# Patient Record
Sex: Female | Born: 1950 | Hispanic: No | Marital: Married | Smoking: Never smoker
Health system: Southern US, Community
[De-identification: ages and names within clinical notes are randomized; demographics above are authoritative.]

## PROBLEM LIST (undated history)

## (undated) DIAGNOSIS — L03115 Cellulitis of right lower limb: Secondary | ICD-10-CM

## (undated) DIAGNOSIS — E785 Hyperlipidemia, unspecified: Secondary | ICD-10-CM

## (undated) DIAGNOSIS — M8580 Other specified disorders of bone density and structure, unspecified site: Secondary | ICD-10-CM

## (undated) DIAGNOSIS — J189 Pneumonia, unspecified organism: Secondary | ICD-10-CM

## (undated) HISTORY — DX: Hyperlipidemia, unspecified: E78.5

## (undated) HISTORY — DX: Pneumonia, unspecified organism: J18.9

## (undated) HISTORY — PX: OTHER SURGICAL HISTORY: SHX169

## (undated) HISTORY — DX: Other specified disorders of bone density and structure, unspecified site: M85.80

## (undated) HISTORY — DX: Cellulitis of right lower limb: L03.115

## (undated) HISTORY — PX: TONSILLECTOMY: SUR1361

---

## 1997-08-09 ENCOUNTER — Other Ambulatory Visit: Admission: RE | Admit: 1997-08-09 | Discharge: 1997-08-09 | Payer: Self-pay | Admitting: *Deleted

## 1998-08-31 ENCOUNTER — Other Ambulatory Visit: Admission: RE | Admit: 1998-08-31 | Discharge: 1998-08-31 | Payer: Self-pay | Admitting: *Deleted

## 1999-10-11 ENCOUNTER — Other Ambulatory Visit: Admission: RE | Admit: 1999-10-11 | Discharge: 1999-10-11 | Payer: Self-pay | Admitting: *Deleted

## 2000-10-15 ENCOUNTER — Other Ambulatory Visit: Admission: RE | Admit: 2000-10-15 | Discharge: 2000-10-15 | Payer: Self-pay | Admitting: *Deleted

## 2001-10-28 ENCOUNTER — Other Ambulatory Visit: Admission: RE | Admit: 2001-10-28 | Discharge: 2001-10-28 | Payer: Self-pay | Admitting: *Deleted

## 2002-11-03 ENCOUNTER — Other Ambulatory Visit: Admission: RE | Admit: 2002-11-03 | Discharge: 2002-11-03 | Payer: Self-pay | Admitting: *Deleted

## 2005-01-10 ENCOUNTER — Ambulatory Visit: Payer: Self-pay | Admitting: Internal Medicine

## 2005-01-17 ENCOUNTER — Ambulatory Visit: Payer: Self-pay | Admitting: Internal Medicine

## 2005-01-22 ENCOUNTER — Ambulatory Visit: Payer: Self-pay | Admitting: Internal Medicine

## 2006-02-14 ENCOUNTER — Ambulatory Visit: Payer: Self-pay | Admitting: Family Medicine

## 2006-12-23 ENCOUNTER — Encounter: Payer: Self-pay | Admitting: Internal Medicine

## 2007-04-30 ENCOUNTER — Encounter: Payer: Self-pay | Admitting: Internal Medicine

## 2007-10-04 ENCOUNTER — Ambulatory Visit: Payer: Self-pay | Admitting: Internal Medicine

## 2007-10-04 ENCOUNTER — Telehealth: Payer: Self-pay | Admitting: Internal Medicine

## 2007-10-04 DIAGNOSIS — Z9089 Acquired absence of other organs: Secondary | ICD-10-CM

## 2007-10-04 DIAGNOSIS — E785 Hyperlipidemia, unspecified: Secondary | ICD-10-CM | POA: Insufficient documentation

## 2007-10-04 DIAGNOSIS — M810 Age-related osteoporosis without current pathological fracture: Secondary | ICD-10-CM | POA: Insufficient documentation

## 2007-10-04 DIAGNOSIS — R519 Headache, unspecified: Secondary | ICD-10-CM | POA: Insufficient documentation

## 2007-10-04 DIAGNOSIS — R51 Headache: Secondary | ICD-10-CM

## 2007-10-04 DIAGNOSIS — S82899A Other fracture of unspecified lower leg, initial encounter for closed fracture: Secondary | ICD-10-CM

## 2007-10-04 DIAGNOSIS — IMO0001 Reserved for inherently not codable concepts without codable children: Secondary | ICD-10-CM

## 2007-10-04 LAB — CONVERTED CEMR LAB
Inflenza A Ag: NEGATIVE
Influenza B Ag: NEGATIVE

## 2007-10-09 LAB — CONVERTED CEMR LAB
Basophils Absolute: 0 10*3/uL (ref 0.0–0.1)
HCT: 36.9 % (ref 36.0–46.0)
Lymphocytes Relative: 36.5 % (ref 12.0–46.0)
Monocytes Absolute: 0.3 10*3/uL (ref 0.1–1.0)
Monocytes Relative: 9.2 % (ref 3.0–12.0)
Platelets: 228 10*3/uL (ref 150–400)
RDW: 12.7 % (ref 11.5–14.6)

## 2007-10-12 ENCOUNTER — Telehealth (INDEPENDENT_AMBULATORY_CARE_PROVIDER_SITE_OTHER): Payer: Self-pay | Admitting: *Deleted

## 2007-10-12 ENCOUNTER — Encounter (INDEPENDENT_AMBULATORY_CARE_PROVIDER_SITE_OTHER): Payer: Self-pay | Admitting: *Deleted

## 2008-01-04 ENCOUNTER — Encounter: Payer: Self-pay | Admitting: Internal Medicine

## 2008-04-10 ENCOUNTER — Ambulatory Visit: Payer: Self-pay | Admitting: Internal Medicine

## 2008-04-10 DIAGNOSIS — E559 Vitamin D deficiency, unspecified: Secondary | ICD-10-CM | POA: Insufficient documentation

## 2008-04-10 DIAGNOSIS — M545 Low back pain: Secondary | ICD-10-CM

## 2008-04-10 LAB — CONVERTED CEMR LAB
Bilirubin Urine: NEGATIVE
Cholesterol, target level: 200 mg/dL
Glucose, Urine, Semiquant: NEGATIVE
Protein, U semiquant: NEGATIVE
Specific Gravity, Urine: <1.005
pH: 6.5

## 2008-11-02 ENCOUNTER — Ambulatory Visit: Payer: Self-pay | Admitting: Internal Medicine

## 2008-11-17 ENCOUNTER — Ambulatory Visit: Payer: Self-pay | Admitting: Internal Medicine

## 2008-11-17 DIAGNOSIS — B079 Viral wart, unspecified: Secondary | ICD-10-CM | POA: Insufficient documentation

## 2008-11-17 DIAGNOSIS — H5310 Unspecified subjective visual disturbances: Secondary | ICD-10-CM | POA: Insufficient documentation

## 2008-12-25 ENCOUNTER — Encounter: Payer: Self-pay | Admitting: Internal Medicine

## 2009-01-08 ENCOUNTER — Encounter (INDEPENDENT_AMBULATORY_CARE_PROVIDER_SITE_OTHER): Payer: Self-pay | Admitting: *Deleted

## 2009-01-11 ENCOUNTER — Encounter: Payer: Self-pay | Admitting: Internal Medicine

## 2009-05-03 ENCOUNTER — Encounter: Payer: Self-pay | Admitting: Internal Medicine

## 2009-07-10 ENCOUNTER — Ambulatory Visit: Payer: Self-pay | Admitting: Internal Medicine

## 2009-09-28 ENCOUNTER — Ambulatory Visit: Payer: Self-pay | Admitting: Family Medicine

## 2009-11-29 ENCOUNTER — Ambulatory Visit: Payer: Self-pay | Admitting: Internal Medicine

## 2009-11-29 DIAGNOSIS — F411 Generalized anxiety disorder: Secondary | ICD-10-CM | POA: Insufficient documentation

## 2010-04-16 ENCOUNTER — Telehealth: Payer: Self-pay | Admitting: Internal Medicine

## 2010-04-17 ENCOUNTER — Encounter: Payer: Self-pay | Admitting: Internal Medicine

## 2010-04-23 NOTE — Assessment & Plan Note (Signed)
Summary: anxiety/cbs   Vital Signs:  Patient profile:   60 year old female Height:      64 inches Weight:      133 pounds Temp:     98.9 degrees F oral Pulse rate:   68 / minute Resp:     15 per minute BP sitting:   122 / 82  (left arm)  Vitals Entered By: Jeremy Johann CMA (November 29, 2009 12:46 PM) CC: discuss anxiety, stress, pain in neck   Primary Care Alayzia Pavlock:  Alwyn Ren  CC:  discuss anxiety, stress, and pain in neck.  History of Present Illness: Major stresses related to her daughter's health issues ( depression , Bulimia) with resultant major anxiety.  Current Medications (verified): 1)  None  Allergies (verified): No Known Drug Allergies  Review of Systems General:  Complains of sleep disorder; denies weight loss; "Mind races all day & night  long ". CV:  Denies palpitations. GI:  Denies constipation, diarrhea, and indigestion. Psych:  Complains of anxiety and easily tearful; denies depression, easily angered, irritability, panic attacks, and suicidal thoughts/plans.  Physical Exam  General:  Thin but well-nourished,in no acute distress; alert,appropriate and cooperative throughout examination Eyes:  No corneal or conjunctival inflammation noted. No lid lag Neck:  No deformities, masses, or tenderness noted.Slight asymmetry Heart:  regular rhythm, no murmur, no gallop, no rub, and bradycardia.   Neurologic:   oriented X3 and DTRs symmetrical and normal.  No tremor Psych:  normally interactive  but slightly tearful.     Impression & Recommendations:  Problem # 1:  ANXIETY STATE, UNSPECIFIED (ICD-300.00)  Her updated medication list for this problem includes:    Citalopram Hydrobromide 20 Mg Tabs (Citalopram hydrobromide) .Marland Kitchen... 1 once daily    Lorazepam 0.5 Mg Tabs (Lorazepam) .Marland Kitchen... 1 every 6-8 hrs as needed  Complete Medication List: 1)  Citalopram Hydrobromide 20 Mg Tabs (Citalopram hydrobromide) .Marland Kitchen.. 1 once daily 2)  Lorazepam 0.5 Mg Tabs  (Lorazepam) .Marland Kitchen.. 1 every 6-8 hrs as needed  Patient Instructions: 1)  Assess response to Citalopram . Prescriptions: LORAZEPAM 0.5 MG TABS (LORAZEPAM) 1 every 6-8 hrs as needed  #30 x 1   Entered and Authorized by:   Marga Melnick MD   Signed by:   Marga Melnick MD on 11/29/2009   Method used:   Print then Give to Patient   RxID:   [id number] CITALOPRAM HYDROBROMIDE 20 MG TABS (CITALOPRAM HYDROBROMIDE) 1 once daily  #30 x 5   Entered and Authorized by:   Marga Melnick MD   Signed by:   Marga Melnick MD on 11/29/2009   Method used:   Print then Give to Patient   RxID:   [id number]

## 2010-04-23 NOTE — Assessment & Plan Note (Signed)
Summary: congested/cbs   Vital Signs:  Patient profile:   60 year old female Height:      64 inches Weight:      129.6 pounds BMI:     22.33 Temp:     99.2 degrees F oral Pulse rate:   64 / minute BP sitting:   110 / 70  (left arm)  Vitals Entered By: Jeremy Johann CMA (September 28, 2009 4:01 PM)         CC: nasal and head congestion, cough, drainage, sore throat, URI symptoms Comments REVIEWED MED LIST, PATIENT AGREED DOSE AND INSTRUCTION CORRECT    History of Present Illness:       This is a 60 year old woman who presents with URI symptoms.  The symptoms began 1 week ago.  The patient complains of nasal congestion, purulent nasal discharge, productive cough, and sick contacts.  Associated symptoms include fever.  The patient denies low-grade fever (<100.5 degrees), fever of 100.5-103 degrees, fever of 103.1-104 degrees, fever to >104 degrees, stiff neck, dyspnea, wheezing, rash, vomiting, diarrhea, use of an antipyretic, and response to antipyretic.  The patient denies itchy watery eyes, itchy throat, sneezing, seasonal symptoms, response to antihistamine, headache, muscle aches, and severe fatigue.  The patient denies the following risk factors for Strep sinusitis: unilateral facial pain, unilateral nasal discharge, poor response to decongestant, double sickening, tooth pain, Strep exposure, tender adenopathy, and absence of cough.    Current Medications (verified): 1)  None  Allergies (verified): No Known Drug Allergies  Physical Exam  General:  Well-developed,well-nourished,in no acute distress; alert,appropriate and cooperative throughout examination Ears:  External ear exam shows no significant lesions or deformities.  Otoscopic examination reveals clear canals, tympanic membranes are intact bilaterally without bulging, retraction, inflammation or discharge. Hearing is grossly normal bilaterally. Nose:  L frontal sinus tenderness and R frontal sinus tenderness.   Mouth:  Oral  mucosa and oropharynx without lesions or exudates.  Teeth in good repair. Neck:  No deformities, masses, or tenderness noted. Lungs:  Normal respiratory effort, chest expands symmetrically. Lungs are clear to auscultation, no crackles or wheezes. Heart:  normal rate and no murmur.   Psych:  Oriented X3 and normally interactive.     Impression & Recommendations:  Problem # 1:  SINUSITIS - ACUTE-NOS (ICD-461.9)  The following medications were removed from the medication list:    Azithromycin 250 Mg Tabs (Azithromycin) .Marland Kitchen... As per pack    Promethazine Vc/codeine 6.25-5-10 Mg/41ml Syrp (Phenyleph-promethazine-cod) .Marland Kitchen... 1 tsp every 6 hrs as needed for cough Her updated medication list for this problem includes:    Augmentin 875-125 Mg Tabs (Amoxicillin-pot clavulanate) .Marland Kitchen... 1 by mouth two times a day    Veramyst 27.5 Mcg/spray Susp (Fluticasone furoate) .Marland Kitchen... 2 sprays each nostril once daily     Pt still has cheratusin from last visit for hs--use mucinex for day cough  Instructed on treatment. Call if symptoms persist or worsen.   Complete Medication List: 1)  Augmentin 875-125 Mg Tabs (Amoxicillin-pot clavulanate) .Marland Kitchen.. 1 by mouth two times a day 2)  Veramyst 27.5 Mcg/spray Susp (Fluticasone furoate) .... 2 sprays each nostril once daily Prescriptions: AUGMENTIN 875-125 MG TABS (AMOXICILLIN-POT CLAVULANATE) 1 by mouth two times a day  #20 x 0   Entered and Authorized by:   Loreen Freud DO   Signed by:   Loreen Freud DO on 09/28/2009   Method used:   Electronically to        [facility] [address] (  retail)       [address]       [place], [place]  [address]       Ph: [phone]       Fax: [phone]   RxID:   [id number]

## 2010-04-23 NOTE — Assessment & Plan Note (Signed)
Summary: virus??//lch   Vital Signs:  Patient profile:   60 year old female Weight:      129.6 pounds Temp:     99.1 degrees F oral Pulse rate:   64 / minute Resp:     14 per minute BP sitting:   118 / 70  (left arm) Cuff size:   regular  Vitals Entered By: Shonna Chock (July 10, 2009 11:53 AM) CC: Weak, aches,cough,headache,  and nasel congestion since Friday Comments REVIEWED MED LIST, PATIENT AGREED DOSE AND INSTRUCTION CORRECT    CC:  Weak, aches, cough, headache, and and nasel congestion since Friday.  History of Present Illness: Onset 07/05/2009  as head congestion ST, myalgias, arthralgias & loose cough. No flu shot.Rx: Mucinex D, NSAIDS w/o benefit.  Allergies (verified): No Known Drug Allergies  Review of Systems General:  Complains of chills, fever, and sweats. ENT:  Complains of nasal congestion and sinus pressure; denies ear discharge, earache, and sore throat; No frontal headache or facial pain but some yellow discharge. Resp:  Denies chest pain with inspiration, shortness of breath, and wheezing; Sputum swallowed.  Physical Exam  General:  Appears tired but in no acute distress; alert,appropriate and cooperative throughout examination Ears:  External ear exam shows no significant lesions or deformities.  Otoscopic examination reveals clear canals, tympanic membranes are intact bilaterally without bulging, retraction, inflammation or discharge. Hearing is grossly normal bilaterally. Nose:  External nasal examination shows no deformity or inflammation. Nasal mucosa are  boggy without lesions or exudates. Mouth:  Oral mucosa and oropharynx without lesions or exudates.  Teeth in good repair. Lungs:  Normal respiratory effort, chest expands symmetrically. Lungs are clear to auscultation, no crackles or wheezes. rattly cough Heart:  Normal rate and regular rhythm. S1 and S2 normal without gallop, murmur, click, rub.S4 Cervical Nodes:  Shotty lymphadenopathy  noted Axillary Nodes:  No palpable lymphadenopathy   Impression & Recommendations:  Problem # 1:  BRONCHITIS-ACUTE (ICD-466.0)  Possible viral /flu @ onset  , now 5 days out  Her updated medication list for this problem includes:    Azithromycin 250 Mg Tabs (Azithromycin) .Marland Kitchen... As per pack    Promethazine Vc/codeine 6.25-5-10 Mg/49ml Syrp (Phenyleph-promethazine-cod) .Marland Kitchen... 1 tsp every 6 hrs as needed for cough  Problem # 2:  URI (ICD-465.9)  Her updated medication list for this problem includes:    Promethazine Vc/codeine 6.25-5-10 Mg/47ml Syrp (Phenyleph-promethazine-cod) .Marland Kitchen... 1 tsp every 6 hrs as needed for cough  Complete Medication List: 1)  Azithromycin 250 Mg Tabs (Azithromycin) .... As per pack 2)  Promethazine Vc/codeine 6.25-5-10 Mg/47ml Syrp (Phenyleph-promethazine-cod) .Marland Kitchen.. 1 tsp every 6 hrs as needed for cough  Patient Instructions: 1)  Neti pot once daily until sinsuses are clear. 2)  Drink as much fluid as you can tolerate for the next few days.Consider annual flu shot Prescriptions: PROMETHAZINE VC/CODEINE 6.25-5-10 MG/5ML SYRP (PHENYLEPH-PROMETHAZINE-COD) 1 tsp every 6 hrs as needed for cough  #120cc x 0   Entered and Authorized by:   Marga Melnick MD   Signed by:   Marga Melnick MD on 07/10/2009   Method used:   Printed then faxed to ...       [facility] ([facility]ail)       [address]       [place], [place]  [address]       Ph: [phone]       Fax: [phone]   RxID:   [id number]  AZITHROMYCIN 250 MG TABS (AZITHROMYCIN) as per pack  #1 x 0   Entered and Authorized by:   Marga Melnick MD   Signed by:   Marga Melnick MD on 07/10/2009   Method used:   Printed then faxed to ...       [facility] (retail)       [address]       [place], [place]  [address]       Ph: [phone]       Fax: [phone]   RxID:   [id number]

## 2010-04-25 NOTE — Progress Notes (Signed)
Summary: RX request  Phone Note Call from Patient   Summary of Call: Patient left message on triage noting that the counselor/therapist she has recommended that she take Clonazepam 0.5mg  1 by mouth three times a day and patient would like prescription sent to [facility] [address]. Please advise. Initial call taken by: Lucious Groves CMA,  April 16, 2010 10:30 AM  Follow-up for Phone Call        Unfortunately , I need this in writing  for documentation as this can be habit forming  if taken on regular basis Follow-up by: Marga Melnick MD,  April 16, 2010 4:21 PM  Additional Follow-up for Phone Call Additional follow up Details #1::        Left message on machine to call back to office. Lucious Groves CMA  April 16, 2010 4:44 PM   Patient notified to have that MD fax over the recommendation. Lucious Groves CMA  April 17, 2010 2:19 PM

## 2010-05-09 NOTE — Letter (Signed)
Summary: [facility]  [facility]   Imported By: Sherian Rein 04/30/2010 11:24:23  _____________________________________________________________________  External Attachment:    Type:   Image     Comment:   External Document

## 2010-07-25 ENCOUNTER — Ambulatory Visit (INDEPENDENT_AMBULATORY_CARE_PROVIDER_SITE_OTHER): Payer: 59 | Admitting: Family Medicine

## 2010-07-25 ENCOUNTER — Encounter: Payer: Self-pay | Admitting: *Deleted

## 2010-07-25 VITALS — BP 112/70 | Temp 98.9°F | Wt 133.5 lb

## 2010-07-25 DIAGNOSIS — R350 Frequency of micturition: Secondary | ICD-10-CM | POA: Insufficient documentation

## 2010-07-25 LAB — POCT URINALYSIS DIPSTICK
Bilirubin, UA: NEGATIVE
Blood, UA: NEGATIVE
Ketones, UA: NEGATIVE
Nitrite, UA: NEGATIVE
pH, UA: 7.5

## 2010-07-25 NOTE — Progress Notes (Signed)
  Subjective:    Patient ID: Linda [name], female    DOB: 03-Aug-1950, 60 y.o.   MRN: [id number]  HPI Urinating frequently- sxs started 1 month ago, intermittant.  Some burning w/ urination.  Worse at night.  Denies fluid intake before bed but drinks a lot of water during the day.  Scant vaginal discharge- denies itching but some 'fishy' odor.  Some sxs of incomplete emptying.  No incontinence.  No sxs of similar.   Review of Systems For ROS see HPI     Objective:   Physical Exam  Constitutional: She appears well-developed and well-nourished. No distress.  Abdominal: Soft. Bowel sounds are normal. She exhibits no distension. There is no tenderness. There is no rebound.       No suprapubic or CVA tenderness          Assessment & Plan:

## 2010-07-25 NOTE — Patient Instructions (Signed)
We will send your urine for culture and call you if anything comes up Continue to drink plenty of fluids Limit your caffeine intake Cranberry juice may help Call with any questions or concerns Have a wonderful trip!!!

## 2010-07-26 ENCOUNTER — Encounter: Payer: Self-pay | Admitting: Internal Medicine

## 2010-07-28 NOTE — Assessment & Plan Note (Signed)
UA unremarkable in office but given pt's sxs will send it for cx.  No abx at this time- will await cx results.  Reviewed supportive care and red flags that should prompt return.  Pt expressed understanding and is in agreement w/ plan.

## 2010-07-29 LAB — URINE CULTURE

## 2010-07-29 MED ORDER — CEPHALEXIN 500 MG PO CAPS: 500.0000 mg | ORAL_CAPSULE | Freq: Two times a day (BID) | ORAL | Status: AC

## 2010-07-29 NOTE — Progress Notes (Signed)
Addended by: Alease Medina on: 07/29/2010 10:09 AM   Modules accepted: Orders

## 2011-02-20 ENCOUNTER — Encounter: Payer: Self-pay | Admitting: Internal Medicine

## 2011-02-27 ENCOUNTER — Encounter: Payer: Self-pay | Admitting: Internal Medicine

## 2011-03-14 ENCOUNTER — Other Ambulatory Visit: Payer: 59 | Admitting: Internal Medicine

## 2011-03-25 HISTORY — PX: COLONOSCOPY: SHX174

## 2011-03-31 ENCOUNTER — Ambulatory Visit (AMBULATORY_SURGERY_CENTER): Payer: No Typology Code available for payment source | Admitting: *Deleted

## 2011-03-31 VITALS — Ht 64.0 in | Wt 134.2 lb

## 2011-03-31 DIAGNOSIS — Z1211 Encounter for screening for malignant neoplasm of colon: Secondary | ICD-10-CM

## 2011-03-31 MED ORDER — PEG-KCL-NACL-NASULF-NA ASC-C 100 G PO SOLR: ORAL | Status: DC

## 2011-04-02 ENCOUNTER — Ambulatory Visit (INDEPENDENT_AMBULATORY_CARE_PROVIDER_SITE_OTHER): Payer: No Typology Code available for payment source | Admitting: Internal Medicine

## 2011-04-02 ENCOUNTER — Encounter: Payer: Self-pay | Admitting: Internal Medicine

## 2011-04-02 VITALS — BP 116/70 | HR 61 | Temp 98.5°F | Resp 12 | Ht 64.25 in | Wt 132.6 lb

## 2011-04-02 DIAGNOSIS — Z0000 Encounter for general adult medical examination without abnormal findings: Secondary | ICD-10-CM

## 2011-04-02 NOTE — Progress Notes (Signed)
Subjective:    Patient ID: Linda [name], female    DOB: 12/02/1950, 61 y.o.   MRN: [id number]  HPI  Linda [name]  is here for a physical;acute issues include pain in the right upper extremity when she lifts weights or does pushups. This been present for 6 months; there was no definite injury or trigger for this.      Review of Systems Patient reports no significant  vision/ hearing  changes, adenopathy,fever, weight change,  persistant / recurrent hoarseness , swallowing issues, chest pain,palpitations,edema,persistant /recurrent cough, hemoptysis, dyspnea( rest/ exertional/paroxysmal nocturnal), gastrointestinal bleeding(melena, rectal bleeding), abdominal pain, significant heartburn,  bowel changes,GU symptoms(dysuria, hematuria,pyuria, incontinence), Gyn symptoms(abnormal  bleeding , pain),  syncope, focal weakness, memory loss,numbness & tingling, skin/hair /nail changes,abnormal bruising or bleeding, anxiety,or depression.      Objective:   Physical Exam Gen.: Thin but healthy and well-nourished in appearance. Alert, appropriate and cooperative throughout exam. Head: Normocephalic without obvious abnormalities  Eyes: No corneal or conjunctival inflammation noted. Pupils equal round reactive to light and accommodation. Fundal exam is benign without hemorrhages, exudate, papilledema. Extraocular motion intact. Vision grossly normal. Ears: External  ear exam reveals no significant lesions or deformities. Canals clear .TMs normal. Hearing is grossly normal bilaterally. Nose: External nasal exam reveals no deformity or inflammation. Nasal mucosa are pink and moist. No lesions or exudates noted.  Mouth: Oral mucosa and oropharynx reveal no lesions or exudates. Teeth in good repair. Neck: No deformities, masses, or tenderness noted. Range of motion & Thyroid normal. Lungs: Normal respiratory effort; chest expands symmetrically. Lungs are clear to auscultation without rales, wheezes, or increased work  of breathing. Heart: Normal rate and rhythm. Normal S1 and S2. No gallop or rub. S 4 vs soft click ; no  murmur. Abdomen: Bowel sounds normal; abdomen soft and nontender. No masses, organomegaly or hernias noted. Genitalia: Dr Ernestina Penna .                                                                                   Musculoskeletal/extremities: No deformity or scoliosis noted of  the thoracic or lumbar spine. No clubbing, cyanosis, edema, or deformity noted. Range of motion  normal .Tone & strength  normal.Joints normal. Nail health  good. There is pain over the lateral  upper right arm to opposition. Vascular: Carotid, radial artery, dorsalis pedis and  posterior tibial pulses are full and equal. No bruits present. Neurologic: Alert and oriented x3. Deep tendon reflexes symmetrical and normal.          Skin: Intact without suspicious lesions or rashes. Lymph: No cervical, axillary lymphadenopathy present. Psych: Mood and affect are normal. Normally interactive                                                                                         Assessment &  Plan:  #1 comprehensive physical exam; no acute findings  #2 right upper extremity pain to opposition; rule out tendinitis #3see Problem List with Assessments & Recommendations Plan: see Orders

## 2011-04-02 NOTE — Patient Instructions (Addendum)
Preventive Health Care: Exercise  30-45  minutes a day, 3-4 days a week. Walking is especially valuable in preventing Osteoporosis. Eat a low-fat diet with lots of fruits and vegetables, up to 7-9 servings per day. Consume less than 30 grams of sugar per day from foods & drinks with High Fructose Corn Syrup as # 1,2,3 or #4 on label. Health Care Power of Attorney & Living Will place you in charge of your health care  decisions. Verify these are  in place. Please call for referral to Hand Surgery  if you concur. Please  schedule fasting Labs : BMET,Lipids, hepatic panel, CBC & dif, TSH. PLEASE BRING THESE INSTRUCTIONS TO FOLLOW UP  LAB APPOINTMENT.This will guarantee correct labs are drawn, eliminating need for repeat blood sampling ( needle sticks ! ). Diagnoses /Codes: V70.0

## 2011-04-04 ENCOUNTER — Other Ambulatory Visit: Payer: Self-pay | Admitting: Internal Medicine

## 2011-04-04 DIAGNOSIS — Z0000 Encounter for general adult medical examination without abnormal findings: Secondary | ICD-10-CM

## 2011-04-07 ENCOUNTER — Other Ambulatory Visit (INDEPENDENT_AMBULATORY_CARE_PROVIDER_SITE_OTHER): Payer: No Typology Code available for payment source

## 2011-04-07 DIAGNOSIS — Z0000 Encounter for general adult medical examination without abnormal findings: Secondary | ICD-10-CM

## 2011-04-07 LAB — CBC WITH DIFFERENTIAL/PLATELET
Basophils Relative: 0.6 % (ref 0.0–3.0)
Eosinophils Relative: 5.4 % — ABNORMAL HIGH (ref 0.0–5.0)
HCT: 39.3 % (ref 36.0–46.0)
Hemoglobin: 13.6 g/dL (ref 12.0–15.0)
Lymphs Abs: 1.4 10*3/uL (ref 0.7–4.0)
Monocytes Relative: 8.8 % (ref 3.0–12.0)
Neutro Abs: 1.5 10*3/uL (ref 1.4–7.7)
WBC: 3.4 10*3/uL — ABNORMAL LOW (ref 4.5–10.5)

## 2011-04-07 LAB — BASIC METABOLIC PANEL
BUN: 13 mg/dL (ref 6–23)
CO2: 30 mEq/L (ref 19–32)
Chloride: 100 mEq/L (ref 96–112)
Potassium: 3.9 mEq/L (ref 3.5–5.1)

## 2011-04-07 LAB — HEPATIC FUNCTION PANEL
Albumin: 4.5 g/dL (ref 3.5–5.2)
Total Bilirubin: 0.6 mg/dL (ref 0.3–1.2)

## 2011-04-07 LAB — LIPID PANEL
Cholesterol: 212 mg/dL — ABNORMAL HIGH (ref 0–200)
Total CHOL/HDL Ratio: 2
Triglycerides: 84 mg/dL (ref 0.0–149.0)
VLDL: 16.8 mg/dL (ref 0.0–40.0)

## 2011-04-07 LAB — TSH: TSH: 2.03 u[IU]/mL (ref 0.35–5.50)

## 2011-04-08 LAB — VITAMIN D 25 HYDROXY (VIT D DEFICIENCY, FRACTURES): Vit D, 25-Hydroxy: 52 ng/mL (ref 30–89)

## 2011-04-18 ENCOUNTER — Ambulatory Visit (AMBULATORY_SURGERY_CENTER): Payer: No Typology Code available for payment source | Admitting: Internal Medicine

## 2011-04-18 ENCOUNTER — Encounter: Payer: Self-pay | Admitting: Internal Medicine

## 2011-04-18 VITALS — BP 146/74 | HR 69 | Temp 97.2°F | Resp 20 | Ht 64.0 in | Wt 134.0 lb

## 2011-04-18 DIAGNOSIS — D126 Benign neoplasm of colon, unspecified: Secondary | ICD-10-CM

## 2011-04-18 DIAGNOSIS — Z1211 Encounter for screening for malignant neoplasm of colon: Secondary | ICD-10-CM

## 2011-04-18 MED ORDER — SODIUM CHLORIDE 0.9 % IV SOLN: 500.0000 mL | INTRAVENOUS | Status: DC

## 2011-04-18 NOTE — Patient Instructions (Signed)
FOLLOW THE DISCHARGE INSTRUCTIONS ON THE GREEN AND BLUE INSTRUCTION SHEETS.  CONTINUE YOUR MEDICATIONS. AWAIT PATHOLOGY RESULTS. 

## 2011-04-18 NOTE — Progress Notes (Signed)
Pressure applied to the abdomen to reach cecum. 

## 2011-04-18 NOTE — Progress Notes (Signed)
Patient did not experience any of the following events: a burn prior to discharge; a fall within the facility; wrong site/side/patient/procedure/implant event; or a hospital transfer or hospital admission upon discharge from the facility. (G8907) Patient did not have preoperative order for IV antibiotic SSI prophylaxis. (G8918)  

## 2011-04-18 NOTE — Op Note (Signed)
[facility] [address] [place], [place]  [address]  COLONOSCOPY PROCEDURE REPORT  PATIENT:  Linda [name], Linda [name]  MR#:  [id number] BIRTHDATE:  1950/07/07, 60 yrs. old  GENDER:  female ENDOSCOPIST:  Linda Morton. Juanda Chance, MD REF. BY:  Marga Melnick, M.D. PROCEDURE DATE:  04/18/2011 PROCEDURE:  Colonoscopy with biopsy ASA CLASS:  Class I INDICATIONS:  colorectal cancer screening, average risk MEDICATIONS:   These medications were titrated to patient response per physician's verbal order, Versed 10 mg, Fentanyl 100 mcg  DESCRIPTION OF PROCEDURE:   After the risks and benefits and of the procedure were explained, informed consent was obtained. Digital rectal exam was performed and revealed no rectal masses. The LB [id number] endoscope was introduced through the anus and advanced to the cecum, which was identified by both the appendix and ileocecal valve.  The quality of the prep was good, using MoviPrep.  The instrument was then slowly withdrawn as the colon was fully examined. <<PROCEDUREIMAGES>>  FINDINGS:  A diminutive polyp was found. 2 mm polyp at 20 cm The polyp was removed using cold biopsy forceps (see image4 and image5).  This was otherwise a normal examination of the colon (see image1, image3, and image6).   Retroflexed views in the rectum revealed no abnormalities.    The scope was then withdrawn from the patient and the procedure completed.  COMPLICATIONS:  None ENDOSCOPIC IMPRESSION: 1) Diminutive polyp 2) Otherwise normal examination RECOMMENDATIONS: 1) Await pathology results 2) High fiber diet.  REPEAT EXAM:  In 10 year(s) for.  ______________________________ Linda Morton. Juanda Chance, MD  CC:  n. eSIGNED:   Hedwig Morton. Linda [name] at 04/18/2011 09:10 AM  Linda [name], [id number]

## 2011-04-21 ENCOUNTER — Telehealth: Payer: Self-pay

## 2011-04-21 NOTE — Telephone Encounter (Signed)
  Follow up Call-  Call back number 04/18/2011  Post procedure Call Back phone  # [phone] ok to leave a message     Patient questions:  Do you have a fever, pain , or abdominal swelling? no Pain Score  0 *  Have you tolerated food without any problems? yes  Have you been able to return to your normal activities? yes  Do you have any questions about your discharge instructions: Diet   no Medications  no Follow up visit  no  Do you have questions or concerns about your Care? no  Actions: * If pain score is 4 or above: No action needed, pain <4.

## 2011-04-22 ENCOUNTER — Encounter: Payer: Self-pay | Admitting: Internal Medicine

## 2011-04-23 ENCOUNTER — Encounter: Payer: Self-pay | Admitting: *Deleted

## 2011-08-13 ENCOUNTER — Encounter: Payer: Self-pay | Admitting: Internal Medicine

## 2011-10-20 ENCOUNTER — Ambulatory Visit (INDEPENDENT_AMBULATORY_CARE_PROVIDER_SITE_OTHER): Payer: No Typology Code available for payment source | Admitting: Internal Medicine

## 2011-10-20 ENCOUNTER — Encounter: Payer: Self-pay | Admitting: Internal Medicine

## 2011-10-20 ENCOUNTER — Telehealth: Payer: Self-pay

## 2011-10-20 VITALS — BP 114/70 | HR 65 | Temp 98.5°F | Wt 131.8 lb

## 2011-10-20 DIAGNOSIS — N390 Urinary tract infection, site not specified: Secondary | ICD-10-CM

## 2011-10-20 LAB — POCT URINALYSIS DIPSTICK
Nitrite, UA: NEGATIVE
Spec Grav, UA: >=1.03
Urobilinogen, UA: 0.2
pH, UA: 6

## 2011-10-20 MED ORDER — NITROFURANTOIN MONOHYD MACRO 100 MG PO CAPS: 100.0000 mg | ORAL_CAPSULE | Freq: Two times a day (BID) | ORAL | Status: AC

## 2011-10-20 MED ORDER — NITROFURANTOIN MONOHYD MACRO 100 MG PO CAPS: 100.0000 mg | ORAL_CAPSULE | Freq: Two times a day (BID) | ORAL | Status: DC

## 2011-10-20 NOTE — Progress Notes (Signed)
  Subjective:    Patient ID: Linda [name], female    DOB: 1950-04-28, 61 y.o.   MRN: [id number]  HPI Last week she began to have urinary frequency; over the last 3 days she's noted chills, sweats, dysuria, headache and myalgias. She has not had associated hematuria or pyuria. She has not checked for fever.  She had a coccus urinary tract infection in May of 2012. This was sensitive to ampicillin, Levaquin, nitrofurantoin, and vancomycin. At that time urine showed no leukocytes and urinalysis was completely normal. At this time she has moderate (2 plus) leukocytes with trace blood.    Review of Systems  She's also had some new lumbosacral area discomfort but no flank pain.     Objective:   Physical Exam General appearance : thin , in  good health and nourishment w/o distress.  Eyes: No conjunctival inflammation or scleral icterus is present.    Heart:  Normal rate and regular rhythm. S1 and S2 normal without gallop, murmur, click, rub or other extra sounds     Lungs:Chest clear to auscultation; no wheezes, rhonchi,rales ,or rubs present.No increased work of breathing.   Abdomen: bowel sounds normal, soft and non-tender without masses, organomegaly or hernias noted.  No guarding or rebound   Skin:Warm & dry.  Intact without suspicious lesions or rashes   There is no tenderness to percussion over the flanks. Straight leg raising is negative bilaterally  Lymphatic: No lymphadenopathy is noted about the head, neck, axilla areas.              Assessment & Plan:  #1 urinary tract infection; past history of enterococcal isolate  Plan: Nitrofurantoin pending return of the cultures

## 2011-10-20 NOTE — Patient Instructions (Addendum)
Drink as much nondairy fluids as possible. Avoid spicy foods or alcohol as  these may aggravate symptoms. Do not take decongestants. Avoid narcotics if possible.  Please try to go on My Chart within the next 24 hours to allow me to release the results directly to you.

## 2011-10-20 NOTE — Telephone Encounter (Signed)
Call from patient and Macrobid not at the pharmacy--- Rxi s documented as phoned in and not faxed. I refaxed the Rx and confirmed that it was received by Elijah Birk the pharmacist.   KP

## 2011-10-23 ENCOUNTER — Telehealth: Payer: Self-pay | Admitting: *Deleted

## 2011-10-23 DIAGNOSIS — N390 Urinary tract infection, site not specified: Secondary | ICD-10-CM

## 2011-10-23 NOTE — Telephone Encounter (Signed)
Pt returned call and asked for me, pt then asked about her husband receiving her information, advised pt will have to fill out DPR form next OV at any time she would like to come in, pt understood and will fill out form asap.

## 2011-10-23 NOTE — Telephone Encounter (Signed)
Pts husband called to ask about the pts recent diagnosis. I advised Mr. Vallely that he is not listed as a designated party and I would not be able to confirm any information for him. He stated that he is her husband and has the right to know what is going on with her. I advised Mr. Espejo that by giving him information, I would be breaking the law. I apologized for any inconvenience, but instructed him that the pt would need to call herself.

## 2011-10-23 NOTE — Telephone Encounter (Signed)
FYI:Pt called in per confused by [facility] Chart message from MD Alwyn Ren noted as follows:  Criteria for a significant Urinary Tract Infection (UTI) are: over 100,000 colonies of a single, not multiple bacteria. As these are not present, symptoms may be due to non infectious causes such as bladder inflammation or spasm (Ex Interstitial Cystitis). A Urology referral is recommended for recurrent or persistent symptoms to rule out such processes. Thank you for using My Chart; it has served [place] well. Hopp   Pt did advise she does want a referral to urology, this nurse placed a referral in pt chart for urology, pt aware that someone will call her from this office for apt

## 2011-11-05 ENCOUNTER — Telehealth: Payer: Self-pay | Admitting: Internal Medicine

## 2011-11-05 NOTE — Telephone Encounter (Signed)
Caller: Danene/Patient; Patient Name: Linda [name]; PCP: Marga Melnick; Best Callback Phone Number: [phone] Patient calling stating she was treated for Urinary Tract Infection (confirmed in Epic-10/20/11) and she is still having urinary frequency. Patient does have an appointment with Urologist as referred by Dr. Alwyn Ren but that is not until September. Patient is requesting to only come in for Urinalysis with no MD visit. Explained that it is not protocol; that it she would require an office visit if still symptomatic. Afebrile. Emergent symptoms of Urinary Symptoms-Female ruled out. See within 24 hours for: Evaluated by provider AND symptoms worsening after following recommended treatment plan for 72 hours. Offered Patient 1600 appointment on 11/05/11 with Dr. Alwyn Ren and 0945, 8/15 appointment with Dr. Beverely Low. Patient refused; will call back if schedule changes.

## 2011-11-19 ENCOUNTER — Telehealth: Payer: Self-pay | Admitting: Internal Medicine

## 2011-11-19 NOTE — Telephone Encounter (Signed)
Caller: Cerria/Patient; Patient Name: Linda [name]; PCP: Marga Melnick; Best Callback Phone Number: [phone]; Reason for call: Urinary Pain, seen one month ago for same and diagnosed with UTI and finished the antibiotic , now she has" urinary frequency and burning that really never stopped", afebrile,asking to be reseen, denies emergent s/s per urinary Symptom Guideline, see in 24 hr disposition due to symptoms worsening after recommended treatment,She prefers to be seen in early am,appointment made for 11/20/11 with Dr Alwyn Ren at 10:00 per request, Home Care advise given,callback perimeters gone over

## 2011-11-20 ENCOUNTER — Encounter: Payer: Self-pay | Admitting: Internal Medicine

## 2011-11-20 ENCOUNTER — Ambulatory Visit (INDEPENDENT_AMBULATORY_CARE_PROVIDER_SITE_OTHER): Payer: No Typology Code available for payment source | Admitting: Internal Medicine

## 2011-11-20 VITALS — BP 118/80 | HR 72 | Temp 98.6°F | Wt 134.8 lb

## 2011-11-20 DIAGNOSIS — R300 Dysuria: Secondary | ICD-10-CM

## 2011-11-20 DIAGNOSIS — R350 Frequency of micturition: Secondary | ICD-10-CM

## 2011-11-20 LAB — POCT URINALYSIS DIPSTICK
Bilirubin, UA: NEGATIVE
Glucose, UA: NEGATIVE
Ketones, UA: NEGATIVE
Leukocytes, UA: NEGATIVE
Protein, UA: NEGATIVE
Spec Grav, UA: 1.015

## 2011-11-20 NOTE — Patient Instructions (Addendum)
Drink as much nondairy fluids as possible. Avoid spicy foods or alcohol as  these may aggravate the condition. Do not take decongestants. Avoid narcotics if possible.   If you activate My Chart; the results can be released to you as soon as they populate from the lab. If you choose not to use this program; the labs have to be reviewed, copied & mailed   causing a delay in getting the results to you.

## 2011-11-20 NOTE — Addendum Note (Signed)
Addended by: Maurice Small on: 11/20/2011 03:45 PM   Modules accepted: Orders

## 2011-11-20 NOTE — Progress Notes (Signed)
  Subjective:    Patient ID: Linda [name], female    DOB: 1950/12/21, 61 y.o.   MRN: [id number]  HPI FREQUENCY: Onset: 2+ weeks    Urgency: yes  Dysuria:no but  intermittent stinging simply sitting Hesitancy: no Hematuria: no  Flank Pain: no Fever: no   Nausea/Vomiting: no Discharge: no Rash: no  Urinalysis 10/20/11 revealed moderate (2) leukocytes but the culture showed only 30,000 colonies of multiple bacterial types     Review of Systems Red Flags  : (Risk Factors for Complicated UTI) Recent Antibiotic Usage (last 30 days): > 30 days ago Symptoms lasting more than seven (7) days: yes More than 3 UTI's last 12 months: no PMH of  1. DM: no 2. Renal Disease/Calculi:no 3. Urinary Tract Abnormality:no 4. Instrumentation/Trauma:no 5. Immunosuppression: no  She has no gynecologic symptoms; gynecology appointment is in October.        Objective:   Physical Exam General appearance : thin but in good health ; well  nourished w/o distress.   Abdomen: bowel sounds normal, soft and non-tender without masses, organomegaly or hernias noted.  No guarding or rebound . No pelvic distention or tenderness. No tenderness to percussion of the flanks  Skin:Warm & dry.  Intact without suspicious lesions or rashes ; no jaundice   Lymphatic: No lymphadenopathy is noted about the head, neck, axilla             Assessment & Plan:  #1 frequency with normal urinalysis. Culture will be collected. She was given copies of the urine culture results and will be given a copy this note to share with her gynecologist. If symptoms persist urologic evaluation may be necessary to rule out process such as interstitial cystitis.

## 2011-11-23 LAB — URINE CULTURE: Colony Count: 60000

## 2011-11-24 ENCOUNTER — Other Ambulatory Visit: Payer: Self-pay | Admitting: Internal Medicine

## 2011-11-24 DIAGNOSIS — N3090 Cystitis, unspecified without hematuria: Secondary | ICD-10-CM

## 2011-11-24 MED ORDER — NITROFURANTOIN MONOHYD MACRO 100 MG PO CAPS: 100.0000 mg | ORAL_CAPSULE | Freq: Two times a day (BID) | ORAL | Status: AC

## 2011-11-26 ENCOUNTER — Telehealth: Payer: Self-pay | Admitting: *Deleted

## 2011-11-26 NOTE — Telephone Encounter (Signed)
Pt left VM stating that she had some question about her labs after reviewing them and would like a call back to discuss. .Left message to call office.

## 2011-12-02 NOTE — Telephone Encounter (Signed)
Left message on voicemail for patient to return call when available   

## 2011-12-03 NOTE — Telephone Encounter (Signed)
Left message on voicemail for patient to return call when available   

## 2011-12-05 NOTE — Telephone Encounter (Signed)
Patient never returned call after several attempts to reach patient . Encounter closed

## 2012-05-08 ENCOUNTER — Other Ambulatory Visit: Payer: Self-pay

## 2013-01-20 ENCOUNTER — Other Ambulatory Visit: Payer: Self-pay | Admitting: Orthopedic Surgery

## 2013-01-20 DIAGNOSIS — M25511 Pain in right shoulder: Secondary | ICD-10-CM

## 2013-01-27 ENCOUNTER — Other Ambulatory Visit: Payer: Self-pay

## 2013-02-02 ENCOUNTER — Other Ambulatory Visit: Payer: No Typology Code available for payment source

## 2013-02-24 ENCOUNTER — Encounter: Payer: Self-pay | Admitting: Internal Medicine

## 2013-04-17 ENCOUNTER — Encounter: Payer: Self-pay | Admitting: Internal Medicine

## 2013-05-05 ENCOUNTER — Encounter: Payer: Self-pay | Admitting: Internal Medicine

## 2013-08-06 ENCOUNTER — Ambulatory Visit (INDEPENDENT_AMBULATORY_CARE_PROVIDER_SITE_OTHER): Payer: BC Managed Care – PPO | Admitting: Family Medicine

## 2013-08-06 ENCOUNTER — Encounter: Payer: Self-pay | Admitting: Family Medicine

## 2013-08-06 VITALS — BP 104/70 | HR 64 | Temp 98.0°F | Wt 140.8 lb

## 2013-08-06 DIAGNOSIS — J309 Allergic rhinitis, unspecified: Secondary | ICD-10-CM | POA: Insufficient documentation

## 2013-08-06 DIAGNOSIS — M810 Age-related osteoporosis without current pathological fracture: Secondary | ICD-10-CM

## 2013-08-06 DIAGNOSIS — R590 Localized enlarged lymph nodes: Secondary | ICD-10-CM | POA: Insufficient documentation

## 2013-08-06 DIAGNOSIS — R599 Enlarged lymph nodes, unspecified: Secondary | ICD-10-CM

## 2013-08-06 NOTE — Progress Notes (Signed)
BP 104/70  Pulse 64  Temp(Src) 98 F (36.7 C) (Oral)  Wt 140 lb 12.8 oz (63.866 kg)  SpO2 94%   CC: swollen gland  Subjective:    Patient ID: Linda [name], female    DOB: 27-May-1950, 63 y.o.   MRN: [id number]  HPI: Linda [name] is a 63 y.o. female presenting on 08/06/2013 for Adenopathy   1 mo h/o sore throat attributed to PNdrainage and over the last week noticing R LAD.  Staying congested, dry nose.  Mild itchy eyes.  Denies fevers/chills, coughing, dysphagia, no hoarse voice.  No nosebleeds.  This year has been bad season for allergies - doesn't take anything Walk in the park 1 mo ago - since then worsening allergy sxs.  No h/o smoking.  Relevant past medical, surgical, family and social history reviewed and updated as indicated.  Allergies and medications reviewed and updated. Current Outpatient Prescriptions on File Prior to Visit  Medication Sig  . Ascorbic Acid (VITAMIN C) 1000 MG tablet Take 1,000 mg by mouth daily.    . Calcium Carb-Cholecalciferol (CALCIUM 1000 + D PO) Take by mouth daily.    . cholecalciferol (VITAMIN D) 1000 UNITS tablet Take 1,000 Units by mouth daily.     No current facility-administered medications on file prior to visit.    Review of Systems Per HPI unless specifically indicated above    Objective:    BP 104/70  Pulse 64  Temp(Src) 98 F (36.7 C) (Oral)  Wt 140 lb 12.8 oz (63.866 kg)  SpO2 94%  Physical Exam  Nursing note and vitals reviewed. Constitutional: She appears well-developed and well-nourished. No distress.  HENT:  Head: Normocephalic and atraumatic.  Right Ear: Hearing, tympanic membrane, external ear and ear canal normal.  Left Ear: Hearing, tympanic membrane, external ear and ear canal normal.  Nose: Mucosal edema (erythematous nasal mucosa) present. No rhinorrhea. Right sinus exhibits no maxillary sinus tenderness and no frontal sinus tenderness. Left sinus exhibits no maxillary sinus tenderness and no frontal  sinus tenderness.  Mouth/Throat: Uvula is midline, oropharynx is clear and moist and mucous membranes are normal. No oropharyngeal exudate, posterior oropharyngeal edema, posterior oropharyngeal erythema or tonsillar abscesses.  Eyes: Conjunctivae and EOM are normal. Pupils are equal, round, and reactive to light. No scleral icterus.  Neck: Normal range of motion. Neck supple. Carotid bruit is not present. No thyromegaly present.  Cardiovascular: Normal rate, regular rhythm, normal heart sounds and intact distal pulses.   No murmur heard. Pulmonary/Chest: Effort normal and breath sounds normal. No respiratory distress. She has no wheezes. She has no rales.  Lymphadenopathy:       Head (right side): No submental, no submandibular, no tonsillar, no preauricular and no posterior auricular adenopathy present.       Head (left side): No submental, no submandibular, no tonsillar, no preauricular and no posterior auricular adenopathy present.    She has cervical adenopathy.       Right cervical: Superficial cervical adenopathy present.       Left cervical: No superficial cervical adenopathy present.       Right: No supraclavicular adenopathy present.       Left: No supraclavicular adenopathy present.  Skin: Skin is warm and dry. No rash noted.      Assessment & Plan:   Problem List Items Addressed This Visit   Cervical lymphadenopathy - Primary     New - of 1 wk duration, along with allergy sxs and ST.  ?viral -  monitor for now.  Discussed reactive LAD. Advised to return if LAD persists.    Allergic rhinitis     Start flonase 2 sprays daily.  Discussed possible epistaxis effect.        Follow up plan: Return if symptoms worsen or fail to improve.

## 2013-08-06 NOTE — Assessment & Plan Note (Signed)
New - of 1 wk duration, along with allergy sxs and ST.  ?viral - monitor for now.  Discussed reactive LAD. Advised to return if LAD persists.

## 2013-08-06 NOTE — Assessment & Plan Note (Signed)
Start flonase 2 sprays daily.  Discussed possible epistaxis effect.

## 2013-08-06 NOTE — Progress Notes (Signed)
Pre visit review using our clinic review tool, if applicable. No additional management support is needed unless otherwise documented below in the visit note. 

## 2013-08-06 NOTE — Patient Instructions (Signed)
I think this lymph node is reactive - to possible viral infection.  If that is the case, should improve with time - give another 3 weeks. If persistent swelling, return to see [place]. Treat allergies with flonase 2 sprays into each nostril once daily. Watch for nosebleeds with this - if that happens, stop flonase. Use nasal saline irrigation.

## 2013-08-29 ENCOUNTER — Encounter: Payer: Self-pay | Admitting: Internal Medicine

## 2013-08-29 ENCOUNTER — Ambulatory Visit (INDEPENDENT_AMBULATORY_CARE_PROVIDER_SITE_OTHER): Payer: BC Managed Care – PPO | Admitting: Internal Medicine

## 2013-08-29 VITALS — BP 134/90 | HR 81 | Temp 98.5°F | Wt 141.8 lb

## 2013-08-29 DIAGNOSIS — M810 Age-related osteoporosis without current pathological fracture: Secondary | ICD-10-CM

## 2013-08-29 DIAGNOSIS — I720 Aneurysm of carotid artery: Secondary | ICD-10-CM

## 2013-08-29 DIAGNOSIS — E559 Vitamin D deficiency, unspecified: Secondary | ICD-10-CM

## 2013-08-29 NOTE — Progress Notes (Signed)
Subjective:    Patient ID: Linda [name], female    DOB: Aug 13, 1950, 63 y.o.   MRN: [id number]  HPI  She is here for followup from her 08/06/13 office visit. At that time she was found on cervical lymphadenopathy attributed to postnasal drainage. She was treated with Flonase but told to followup if the lymphadenopathy persisted. She feels no difference in this despite that therapy.  Additionally she's been found to have osteoporosis at the left femoral neck with a T score of -2.5. This is associated with significant increase risk at both sites 4.4% the hip and 20.6% increase fracture risk of a major osteoporotic fracture. Apparently her gynecologist feels no dimension this indicated.  Her diagnosis of osteoporosis was updated in problem list.    Review of Systems  She has no unexplained weight loss, dysphagia, or abdominal pain.  She has no abnormal bruising or bleeding.  She has no difficulty stopping bleeding with injury. Melena, rectal bleeding, or persistently small caliber stools are denied. She denies epistaxis, hemoptysis, hematuria, melena, or rectal bleeding.        Objective:   Physical Exam Gen.: Healthy and well-nourished in appearance. Alert, appropriate and cooperative throughout exam. Appears younger than stated age  Head: Normocephalic without obvious abnormalities Eyes: No corneal or conjunctival inflammation noted. Pupils equal round reactive to light and accommodation.Ears: External  ear exam reveals no significant lesions or deformities. Some wax bilaterally Nose: External nasal exam reveals no deformity or inflammation. Nasal mucosa are pink and moist. No lesions or exudates noted.   Mouth: Oral mucosa and oropharynx reveal no lesions or exudates. Teeth in good repair. Neck: No deformities, masses, or tenderness noted. Prominent superficial R cervical artery.Range of motion decreased. Thyroid reveals physiologic asymmetry Lungs: Normal respiratory effort; chest  expands symmetrically. Lungs are clear to auscultation without rales, wheezes, or increased work of breathing. Heart: Normal rate and rhythm. Normal S1 and S2. No gallop, click, or rub. No murmur. Repeat BP 130/82 Abdomen: Bowel sounds normal; abdomen soft and nontender. No masses, organomegaly or hernias noted.                           Musculoskeletal/extremities: No deformity or scoliosis noted of  the thoracic or lumbar spine.  No clubbing, cyanosis, edema, or significant extremity  deformity noted. Range of motion normal .Tone & strength normal. Hand joints normal  Fingernail  health good. Able to lie down & sit up w/o help. Negative SLR bilaterally Vascular: Carotid, radial artery, dorsalis pedis and  posterior tibial pulses are full and equal. No bruits present. 10-11 mm superficial artery right neck. This transilluminates and is visibly and palpably pulsatile. Neurologic: Alert and oriented x3. Deep tendon reflexes symmetrical and normal.  Gait normal  including heel & toe walking . Rhomberg & finger to nose       Skin: Intact without suspicious lesions or rashes. Lymph: No cervical, axillary lymphadenopathy present. Psych: Mood and affect are normal. Normally interactive  Assessment & Plan:  #1 neck mass, vascular. Rule out aneurysm. No evidence of any cervical lymphadenopathy  #2 osteoporosis, status post at least 5 years of bisphosphonate. This is monitored by gynecologist. Bone density should be performed every 25 months and vitamin D level annually. Weight bearing exercises discussed.

## 2013-08-29 NOTE — Patient Instructions (Signed)
The Carotid Doppler will be scheduled and you'll be notified of the time.

## 2013-08-29 NOTE — Progress Notes (Signed)
Pre visit review using our clinic review tool, if applicable. No additional management support is needed unless otherwise documented below in the visit note. 

## 2013-08-31 ENCOUNTER — Other Ambulatory Visit: Payer: Self-pay | Admitting: Internal Medicine

## 2013-08-31 ENCOUNTER — Telehealth: Payer: Self-pay | Admitting: Internal Medicine

## 2013-08-31 DIAGNOSIS — I720 Aneurysm of carotid artery: Secondary | ICD-10-CM

## 2013-08-31 NOTE — Telephone Encounter (Signed)
Patient would like to talk with Dr. Linna Darner about her test results(doppler).

## 2013-09-01 NOTE — Telephone Encounter (Signed)
I'll give her results as soon as they return. This test is being done simply to be cautious ; I do not expect any significant vascular issue.

## 2013-09-04 ENCOUNTER — Encounter: Payer: Self-pay | Admitting: Internal Medicine

## 2013-09-04 DIAGNOSIS — R7303 Prediabetes: Secondary | ICD-10-CM | POA: Insufficient documentation

## 2013-09-04 DIAGNOSIS — R739 Hyperglycemia, unspecified: Secondary | ICD-10-CM | POA: Insufficient documentation

## 2013-09-07 ENCOUNTER — Ambulatory Visit (HOSPITAL_COMMUNITY): Payer: BC Managed Care – PPO | Attending: Cardiology | Admitting: Cardiology

## 2013-09-07 DIAGNOSIS — R229 Localized swelling, mass and lump, unspecified: Secondary | ICD-10-CM | POA: Insufficient documentation

## 2013-09-07 DIAGNOSIS — R221 Localized swelling, mass and lump, neck: Secondary | ICD-10-CM

## 2013-09-07 DIAGNOSIS — E785 Hyperlipidemia, unspecified: Secondary | ICD-10-CM | POA: Insufficient documentation

## 2013-09-07 DIAGNOSIS — R220 Localized swelling, mass and lump, head: Secondary | ICD-10-CM

## 2013-09-07 DIAGNOSIS — I720 Aneurysm of carotid artery: Secondary | ICD-10-CM

## 2013-09-07 NOTE — Progress Notes (Signed)
Carotid duplex completed 

## 2013-10-12 ENCOUNTER — Telehealth: Payer: Self-pay | Admitting: Internal Medicine

## 2013-10-12 DIAGNOSIS — R350 Frequency of micturition: Secondary | ICD-10-CM

## 2013-10-12 DIAGNOSIS — R309 Painful micturition, unspecified: Secondary | ICD-10-CM

## 2013-10-12 NOTE — Telephone Encounter (Signed)
CCU for UA & C&S

## 2013-10-12 NOTE — Telephone Encounter (Signed)
Pt request lab order because she might have uti. Pt stated that she urinating a lot and it is burning. Please advise, pt wants to get the lab work done tomorrow morning.

## 2013-10-13 ENCOUNTER — Other Ambulatory Visit (INDEPENDENT_AMBULATORY_CARE_PROVIDER_SITE_OTHER): Payer: BC Managed Care – PPO

## 2013-10-13 DIAGNOSIS — R300 Dysuria: Secondary | ICD-10-CM

## 2013-10-13 DIAGNOSIS — R350 Frequency of micturition: Secondary | ICD-10-CM

## 2013-10-13 DIAGNOSIS — R309 Painful micturition, unspecified: Secondary | ICD-10-CM

## 2013-10-13 LAB — URINALYSIS, ROUTINE W REFLEX MICROSCOPIC
BILIRUBIN URINE: NEGATIVE
Hgb urine dipstick: NEGATIVE
Ketones, ur: NEGATIVE
LEUKOCYTES UA: NEGATIVE
NITRITE: NEGATIVE
RBC / HPF: NONE SEEN (ref 0–?)
Specific Gravity, Urine: 1.01 (ref 1.000–1.030)
Total Protein, Urine: NEGATIVE
Urine Glucose: NEGATIVE
Urobilinogen, UA: 0.2 (ref 0.0–1.0)
WBC, UA: NONE SEEN (ref 0–?)
pH: 7 (ref 5.0–8.0)

## 2013-10-13 NOTE — Telephone Encounter (Signed)
Called pt no answer LMOM md approve UA. Lab has been entered...Linda [name]

## 2013-10-14 LAB — URINE CULTURE
Colony Count: NO GROWTH
Organism ID, Bacteria: NO GROWTH

## 2013-11-08 ENCOUNTER — Ambulatory Visit (INDEPENDENT_AMBULATORY_CARE_PROVIDER_SITE_OTHER): Payer: BC Managed Care – PPO | Admitting: Internal Medicine

## 2013-11-08 ENCOUNTER — Encounter: Payer: Self-pay | Admitting: Internal Medicine

## 2013-11-08 VITALS — BP 116/82 | HR 66 | Temp 98.3°F | Wt 140.0 lb

## 2013-11-08 DIAGNOSIS — J028 Acute pharyngitis due to other specified organisms: Principal | ICD-10-CM

## 2013-11-08 DIAGNOSIS — B9789 Other viral agents as the cause of diseases classified elsewhere: Principal | ICD-10-CM

## 2013-11-08 DIAGNOSIS — J029 Acute pharyngitis, unspecified: Secondary | ICD-10-CM

## 2013-11-08 DIAGNOSIS — M810 Age-related osteoporosis without current pathological fracture: Secondary | ICD-10-CM

## 2013-11-08 NOTE — Progress Notes (Signed)
   Subjective:    Patient ID: Linda [name], female    DOB: 07-28-1950, 63 y.o.   MRN: [id number]  HPI She presents with sore throat present for 2 days associated with canker sores along the lateral aspect of the right tongue. She also has had some headache at the base of the nose without frontal sinus pain.  She has no other signs of upper respiratory tract infection or extrinsic process.  Additionally she had not returned to discuss the results of her bone density study done 04/11/13. This did demonstrate osteoporosis of the left femoral neck with a value of -2.5. There've been 4.6% loss at the hip and 5.2% loss in the spine.  She believes that she took Fosamax for a total of 7 years.  Last vitamin D record is 23 and January 2013.      Review of Systems Frontal headache, facial pain , nasal purulence, dental pain, sore throat , otic pain or otic discharge denied. No fever , chills or sweats. Extrinsic symptoms of itchy, watery eyes, sneezing, or angioedema are denied. There is no significant cough, sputum production, wheezing,or  paroxysmal nocturnal dyspnea.     Objective:   Physical Exam  Positive or pertinent physical findings include:  There are  minor aphthous ulcers along the lateral aspect of the right tongue. Otherwise her exam is unremarkable.  General appearance:good health ;well nourished; no acute distress or increased work of breathing is present.  No  lymphadenopathy about the head, neck, or axilla noted.   Eyes: No conjunctival inflammation or lid edema is present. There is no scleral icterus.  Ears:  External ear exam shows no significant lesions or deformities.  Otoscopic examination reveals clear canals, tympanic membranes are intact bilaterally without bulging, retraction, inflammation or discharge.  Nose:  External nasal examination shows no deformity or inflammation. Nasal mucosa are pink and moist without lesions or exudates. No septal dislocation or  deviation.No obstruction to airflow.   Oral exam: Dental hygiene is good; lips and gums are healthy appearing.There is no oropharyngeal erythema or exudate noted.   Neck:  No deformities, thyromegaly, masses, or tenderness noted.   Supple with full range of motion without pain.   Heart:  Normal rate and regular rhythm. S1 and S2 normal without gallop, murmur, click, rub or other extra sounds.   Lungs:Chest clear to auscultation; no wheezes, rhonchi,rales ,or rubs present.No increased work of breathing.    Extremities:  No cyanosis, edema, or clubbing  noted    Skin: Warm & dry          Assessment & Plan:  #1 viral pharyngitis with canker sores; see after visit summary  #2 osteoporosis at the left femoral hip with progressive bone loss in the last 25 months. Status post at least 5 years Fosamax.  Plan: I recommended she review these data with her gynecologist who ordered the bone density. If treatment is recommended parenteral agent would be recommended rather than bisphosphonate . Additionally she will verify whether she's had a followup vitamin D level since January 2013.

## 2013-11-08 NOTE — Patient Instructions (Addendum)
Zicam Melts or Zinc lozenges ; vitamin C 2000 mg daily; & Echinacea for 4-7 days. Report fever, exudate("pus") or progressive pain.   Normal T scores on a bone density exam (BMD)  are +1 to -1. Osteopenia would be -1.1 to -2.4. Osteoporosis is defined by a  T score worse than -2.4.  Treatment should be considered  with  T scores worse than -1.5, particularly if there is  family history of low bone density or personal  past history of atypical fractue.BMD should be monitored every 25 months. Recommended lifestyle interventions to prevent  Osteoporosis include calcium 600 mg twice a day  & vitamin D3 supplementation to keep vitamin  D  level @ least 40-60. The usual vitamin D3 dose is 1000 IU daily; but individual dose is determined by annual vitamin D level monitor. Last vit D level was 52 in 03/2011. Also weight bearing exercise such as  walking 30-45 minutes 3-4  X per week is recommended. Your values showstatistical loss @ spine & hip . Osteoporosis present @ L hip (femoral neck).

## 2013-11-08 NOTE — Progress Notes (Signed)
Pre visit review using our clinic review tool, if applicable. No additional management support is needed unless otherwise documented below in the visit note. 

## 2014-01-06 ENCOUNTER — Other Ambulatory Visit: Payer: Self-pay

## 2014-01-23 ENCOUNTER — Encounter: Payer: Self-pay | Admitting: Internal Medicine

## 2014-01-25 ENCOUNTER — Encounter: Payer: Self-pay | Admitting: Internal Medicine

## 2014-01-25 ENCOUNTER — Ambulatory Visit (INDEPENDENT_AMBULATORY_CARE_PROVIDER_SITE_OTHER): Payer: BC Managed Care – PPO | Admitting: Internal Medicine

## 2014-01-25 VITALS — BP 130/86 | HR 78 | Temp 97.9°F | Resp 12 | Wt 140.2 lb

## 2014-01-25 DIAGNOSIS — T2101XA Burn of unspecified degree of chest wall, initial encounter: Secondary | ICD-10-CM

## 2014-01-25 MED ORDER — SILVER SULFADIAZINE 1 % EX CREA: 1.0000 "application " | TOPICAL_CREAM | Freq: Every day | CUTANEOUS | Status: DC

## 2014-01-25 MED ORDER — CEPHALEXIN 500 MG PO CAPS: 500.0000 mg | ORAL_CAPSULE | Freq: Two times a day (BID) | ORAL | Status: DC

## 2014-01-25 NOTE — Patient Instructions (Addendum)
IF POSSIBLE :Dip gauze in  sterile saline and applied to the wound twice a day.  Place Telfa , non stick dressing inside bra.   The saline can be purchased at the drugstore or you can make your own .Boil cup of salt in a gallon of water. Store mixture  in a clean container.Report Warning  signs as discussed (red streaks, pus, fever, increasing pain).Fill the  prescription for antibiotic only if these occur.

## 2014-01-25 NOTE — Progress Notes (Signed)
Pre visit review using our clinic review tool, if applicable. No additional management support is needed unless otherwise documented below in the visit note. 
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
## 2014-02-07 ENCOUNTER — Encounter: Payer: Self-pay | Admitting: Internal Medicine

## 2014-03-29 ENCOUNTER — Ambulatory Visit (INDEPENDENT_AMBULATORY_CARE_PROVIDER_SITE_OTHER): Payer: BLUE CROSS/BLUE SHIELD | Admitting: Internal Medicine

## 2014-03-29 ENCOUNTER — Encounter: Payer: Self-pay | Admitting: Internal Medicine

## 2014-03-29 VITALS — BP 130/72 | HR 84 | Temp 98.4°F | Ht 64.5 in | Wt 138.2 lb

## 2014-03-29 DIAGNOSIS — S8992XA Unspecified injury of left lower leg, initial encounter: Secondary | ICD-10-CM
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
## 2014-03-29 NOTE — Patient Instructions (Signed)
Please report warning signs as we discussed. Worrisome would be change in color or size, increased pain, fever, or pus production. Please schedule CPX

## 2014-03-29 NOTE — Progress Notes (Signed)
Pre visit review using our clinic review tool, if applicable. No additional management support is needed unless otherwise documented below in the visit note. 

## 2014-05-22 ENCOUNTER — Ambulatory Visit (INDEPENDENT_AMBULATORY_CARE_PROVIDER_SITE_OTHER): Payer: BLUE CROSS/BLUE SHIELD | Admitting: Internal Medicine

## 2014-05-22 ENCOUNTER — Encounter: Payer: Self-pay | Admitting: Internal Medicine

## 2014-05-22 VITALS — BP 122/90 | HR 73 | Temp 98.1°F | Wt 139.0 lb

## 2014-05-22 DIAGNOSIS — M810 Age-related osteoporosis without current pathological fracture: Secondary | ICD-10-CM

## 2014-05-22 DIAGNOSIS — Z860100 Personal history of colon polyps, unspecified: Secondary | ICD-10-CM

## 2014-05-22 DIAGNOSIS — Z0189 Encounter for other specified special examinations: Secondary | ICD-10-CM

## 2014-05-22 DIAGNOSIS — R739 Hyperglycemia, unspecified: Secondary | ICD-10-CM

## 2014-05-22 DIAGNOSIS — Z0000 Encounter for general adult medical examination without abnormal findings: Secondary | ICD-10-CM

## 2014-05-22 NOTE — Progress Notes (Signed)
Pre visit review using our clinic review tool, if applicable. No additional management support is needed unless otherwise documented below in the visit note. 

## 2014-05-22 NOTE — Patient Instructions (Signed)
  Your next office appointment will be determined based upon review of your pending labs   Those instructions will be transmitted to you through My Chart   Critical values will be called. Followup as needed for any active or acute issue.  Minimal Blood Pressure Goal= AVERAGE < 140/90;  Ideal is an AVERAGE < 135/85. This AVERAGE should be calculated from @ least 5-7 BP readings taken @ different times of day on different days of week. You should not respond to isolated BP readings , but rather the AVERAGE for that week .Please bring your  blood pressure cuff to office visits to verify that it is reliable.It  can also be checked against the blood pressure device at the pharmacy. Finger or wrist cuffs are not dependable; an arm cuff is.

## 2014-05-22 NOTE — Progress Notes (Signed)
   Subjective:    Patient ID: Linda [name], female    DOB: [birth year], 64 y.o.   MRN: [id number]  HPI She is here for a physical;acute issues denied.   She is on a heart healthy diet; she exercises 7 days a week which includes walking because of osteoporosis. She has no associated cardio pulmonary symptoms.  She had a 2 mm diminutive polyp removed in January 2013. She'll be be due for follow-up in 2023. She has no active GI symptoms  She had taken Fosamax for over 5 years. A bisphosphonate was restarted by her gynecologist in October 2 015.  Review of Systems  Chest pain, palpitations, tachycardia, exertional dyspnea, paroxysmal nocturnal dyspnea, claudication or edema are absent.  Unexplained weight loss, abdominal pain, significant dyspepsia, dysphagia, melena, rectal bleeding, or persistently small caliber stools are denied.  She has polyuria w/o polydipsia or polyphagia.    Objective:   Physical Exam  Gen.: Adequately nourished in appearance. Alert, appropriate and cooperative throughout exam. Appears younger than stated age  Head: Normocephalic without obvious abnormalities  Eyes: No corneal or conjunctival inflammation noted. Pupils equal round reactive to light and accommodation. Extraocular motion intact.  Ears: External  ear exam reveals no significant lesions or deformities. Canals clear .TMs normal. Hearing is grossly normal bilaterally. Nose: External nasal exam reveals no deformity or inflammation. Nasal mucosa are pink and moist. No lesions or exudates noted.   Mouth: Oral mucosa and oropharynx reveal no lesions or exudates. Teeth in good repair. Neck: No deformities, masses, or tenderness noted. Range of motion &. Thyroid normal. Lungs: Normal respiratory effort; chest expands symmetrically. Lungs are clear to auscultation without rales, wheezes, or increased work of breathing. Heart: Normal rate and rhythm. Normal S1 and S2. No gallop, click, or rub. No  murmur. Abdomen: Bowel sounds normal; abdomen soft and nontender. No masses, organomegaly or hernias noted. Genitalia:  as per Gyn                                  Musculoskeletal/extremities: No deformity or scoliosis noted of  the thoracic or lumbar spine.  No clubbing, cyanosis, edema, or significant extremity  deformity noted.  Range of motion normal . Tone & strength normal. Hand joints normal Fingernail  health good. Slight crepitus of knees  Able to lie down & sit up w/o help.  Negative SLR bilaterally Vascular: Carotid, radial artery, dorsalis pedis and  posterior tibial pulses are full and equal. No bruits present. Neurologic: Alert and oriented x3. Deep tendon reflexes symmetrical and normal.  Gait normal   Skin: Intact without suspicious lesions or rashes. Lymph: No cervical, axillary lymphadenopathy present. Psych: Mood and affect are normal. Normally interactive                                                                                      Assessment & Plan:  #1 comprehensive physical exam; no acute findings  Plan: see Orders  & Recommendations

## 2014-05-23 ENCOUNTER — Other Ambulatory Visit (INDEPENDENT_AMBULATORY_CARE_PROVIDER_SITE_OTHER): Payer: BLUE CROSS/BLUE SHIELD

## 2014-05-23 DIAGNOSIS — E559 Vitamin D deficiency, unspecified: Secondary | ICD-10-CM | POA: Diagnosis not present

## 2014-05-23 DIAGNOSIS — E785 Hyperlipidemia, unspecified: Secondary | ICD-10-CM | POA: Diagnosis not present

## 2014-05-23 DIAGNOSIS — Z0000 Encounter for general adult medical examination without abnormal findings: Secondary | ICD-10-CM

## 2014-05-23 DIAGNOSIS — M810 Age-related osteoporosis without current pathological fracture: Secondary | ICD-10-CM

## 2014-05-23 DIAGNOSIS — Z0189 Encounter for other specified special examinations: Secondary | ICD-10-CM

## 2014-05-23 LAB — CBC WITH DIFFERENTIAL/PLATELET
Basophils Absolute: 0 K/uL (ref 0.0–0.1)
Basophils Relative: 0.6 % (ref 0.0–3.0)
Eosinophils Absolute: 0.1 K/uL (ref 0.0–0.7)
Eosinophils Relative: 3 % (ref 0.0–5.0)
HCT: 39.5 % (ref 36.0–46.0)
Hemoglobin: 13.7 g/dL (ref 12.0–15.0)
Lymphocytes Relative: 37.8 % (ref 12.0–46.0)
Lymphs Abs: 1.4 K/uL (ref 0.7–4.0)
MCHC: 34.7 g/dL (ref 30.0–36.0)
MCV: 86.3 fl (ref 78.0–100.0)
Monocytes Absolute: 0.3 K/uL (ref 0.1–1.0)
Monocytes Relative: 7.8 % (ref 3.0–12.0)
Neutro Abs: 1.9 K/uL (ref 1.4–7.7)
Neutrophils Relative %: 50.8 % (ref 43.0–77.0)
Platelets: 244 K/uL (ref 150.0–400.0)
RBC: 4.58 Mil/uL (ref 3.87–5.11)
RDW: 13.7 % (ref 11.5–15.5)
WBC: 3.8 K/uL — ABNORMAL LOW (ref 4.0–10.5)

## 2014-05-23 LAB — LIPID PANEL
Cholesterol: 213 mg/dL — ABNORMAL HIGH (ref 0–200)
HDL: 102.2 mg/dL
LDL Cholesterol: 100 mg/dL — ABNORMAL HIGH (ref 0–99)
NonHDL: 110.8
Total CHOL/HDL Ratio: 2
Triglycerides: 55 mg/dL (ref 0.0–149.0)
VLDL: 11 mg/dL (ref 0.0–40.0)

## 2014-05-23 LAB — HEMOGLOBIN A1C: HEMOGLOBIN A1C: 5.6 % (ref 4.6–6.5)

## 2014-05-23 LAB — BASIC METABOLIC PANEL WITH GFR
BUN: 11 mg/dL (ref 6–23)
CO2: 32 meq/L (ref 19–32)
Calcium: 9.9 mg/dL (ref 8.4–10.5)
Chloride: 102 meq/L (ref 96–112)
Creatinine, Ser: 0.67 mg/dL (ref 0.40–1.20)
GFR: 94.25 mL/min
Glucose, Bld: 102 mg/dL — ABNORMAL HIGH (ref 70–99)
Potassium: 4.2 meq/L (ref 3.5–5.1)
Sodium: 139 meq/L (ref 135–145)

## 2014-05-23 LAB — HEPATIC FUNCTION PANEL
ALT: 11 U/L (ref 0–35)
AST: 16 U/L (ref 0–37)
Albumin: 4.4 g/dL (ref 3.5–5.2)
Alkaline Phosphatase: 52 U/L (ref 39–117)
Bilirubin, Direct: 0.1 mg/dL (ref 0.0–0.3)
Total Bilirubin: 0.5 mg/dL (ref 0.2–1.2)
Total Protein: 7.1 g/dL (ref 6.0–8.3)

## 2014-05-23 LAB — TSH: TSH: 2.76 u[IU]/mL (ref 0.35–4.50)

## 2014-05-23 LAB — VITAMIN D 25 HYDROXY (VIT D DEFICIENCY, FRACTURES): VITD: 42.43 ng/mL (ref 30.00–100.00)

## 2014-11-03 ENCOUNTER — Telehealth: Payer: Self-pay | Admitting: *Deleted

## 2014-11-03 DIAGNOSIS — N3090 Cystitis, unspecified without hematuria: Secondary | ICD-10-CM

## 2014-11-03 DIAGNOSIS — R350 Frequency of micturition: Secondary | ICD-10-CM

## 2014-11-03 NOTE — Telephone Encounter (Signed)
Left msg on triage stating she is having sxs of UTI want to see if md will order a UA. Pls advise...Linda [name]

## 2014-11-03 NOTE — Telephone Encounter (Signed)
CCU for UA and C&S Verify symptom

## 2014-11-03 NOTE — Addendum Note (Signed)
Addended by: Earnstine Regal on: 11/03/2014 01:11 PM   Modules accepted: Orders

## 2014-11-03 NOTE — Telephone Encounter (Signed)
See reply

## 2014-11-03 NOTE — Telephone Encounter (Signed)
Pt having urine frequency with burning inform place order for UA...Linda [name]

## 2014-11-08 ENCOUNTER — Other Ambulatory Visit (INDEPENDENT_AMBULATORY_CARE_PROVIDER_SITE_OTHER): Payer: BLUE CROSS/BLUE SHIELD

## 2014-11-08 DIAGNOSIS — N3090 Cystitis, unspecified without hematuria: Secondary | ICD-10-CM | POA: Diagnosis not present

## 2014-11-08 LAB — URINALYSIS, ROUTINE W REFLEX MICROSCOPIC
BILIRUBIN URINE: NEGATIVE
HGB URINE DIPSTICK: NEGATIVE
KETONES UR: NEGATIVE
NITRITE: NEGATIVE
PH: 6 (ref 5.0–8.0)
RBC / HPF: NONE SEEN (ref 0–?)
Specific Gravity, Urine: 1.01 (ref 1.000–1.030)
Total Protein, Urine: NEGATIVE
Urine Glucose: NEGATIVE
Urobilinogen, UA: 0.2 (ref 0.0–1.0)

## 2014-11-09 LAB — URINE CULTURE: Colony Count: >=100000

## 2014-11-10 ENCOUNTER — Encounter: Payer: Self-pay | Admitting: Internal Medicine

## 2015-01-19 ENCOUNTER — Ambulatory Visit (INDEPENDENT_AMBULATORY_CARE_PROVIDER_SITE_OTHER): Payer: BLUE CROSS/BLUE SHIELD

## 2015-01-19 DIAGNOSIS — Z23 Encounter for immunization: Secondary | ICD-10-CM

## 2015-03-01 ENCOUNTER — Encounter: Payer: Self-pay | Admitting: Internal Medicine

## 2015-05-18 ENCOUNTER — Encounter: Payer: Self-pay | Admitting: Internal Medicine

## 2015-10-03 ENCOUNTER — Encounter: Payer: Self-pay | Admitting: Internal Medicine

## 2015-10-03 ENCOUNTER — Ambulatory Visit (INDEPENDENT_AMBULATORY_CARE_PROVIDER_SITE_OTHER): Payer: BLUE CROSS/BLUE SHIELD | Admitting: Internal Medicine

## 2015-10-03 VITALS — BP 124/86 | HR 71 | Temp 98.5°F | Resp 16 | Ht 65.0 in | Wt 134.0 lb

## 2015-10-03 DIAGNOSIS — Z114 Encounter for screening for human immunodeficiency virus [HIV]: Secondary | ICD-10-CM

## 2015-10-03 DIAGNOSIS — M810 Age-related osteoporosis without current pathological fracture: Secondary | ICD-10-CM | POA: Diagnosis not present

## 2015-10-03 DIAGNOSIS — Z23 Encounter for immunization: Secondary | ICD-10-CM

## 2015-10-03 DIAGNOSIS — Z1159 Encounter for screening for other viral diseases: Secondary | ICD-10-CM

## 2015-10-03 DIAGNOSIS — Z0000 Encounter for general adult medical examination without abnormal findings: Secondary | ICD-10-CM | POA: Diagnosis not present

## 2015-10-03 NOTE — Progress Notes (Signed)
Pre visit review using our clinic review tool, if applicable. No additional management support is needed unless otherwise documented below in the visit note. 

## 2015-10-03 NOTE — Patient Instructions (Addendum)
Consider Prolia for osteoporosis.    Test(s) ordered today. Your results will be released to MyChart (or called to you) after review, usually within 72hours after test completion. If any changes need to be made, you will be notified at that same time.  All other Health Maintenance issues reviewed.   All recommended immunizations and age-appropriate screenings are up-to-date or discussed.  Tetanus vaccine and shingles vaccine administered today.   Medications reviewed and updated.  No changes recommended at this time.   Please followup in one year  Health Maintenance, Female Adopting a healthy lifestyle and getting preventive care can go a long way to promote health and wellness. Talk with your health care provider about what schedule of regular examinations is right for you. This is a good chance for you to check in with your provider about disease prevention and staying healthy. In between checkups, there are plenty of things you can do on your own. Experts have done a lot of research about which lifestyle changes and preventive measures are most likely to keep you healthy. Ask your health care provider for more information. WEIGHT AND DIET  Eat a healthy diet  Be sure to include plenty of vegetables, fruits, low-fat dairy products, and lean protein.  Do not eat a lot of foods high in solid fats, added sugars, or salt.  Get regular exercise. This is one of the most important things you can do for your health.  Most adults should exercise for at least 150 minutes each week. The exercise should increase your heart rate and make you sweat (moderate-intensity exercise).  Most adults should also do strengthening exercises at least twice a week. This is in addition to the moderate-intensity exercise.  Maintain a healthy weight  Body mass index (BMI) is a measurement that can be used to identify possible weight problems. It estimates body fat based on height and weight. Your health care  provider can help determine your BMI and help you achieve or maintain a healthy weight.  For females 44 years of age and older:   A BMI below 18.5 is considered underweight.  A BMI of 18.5 to 24.9 is normal.  A BMI of 25 to 29.9 is considered overweight.  A BMI of 30 and above is considered obese.  Watch levels of cholesterol and blood lipids  You should start having your blood tested for lipids and cholesterol at 65 years of age, then have this test every 5 years.  You may need to have your cholesterol levels checked more often if:  Your lipid or cholesterol levels are high.  You are older than 65 years of age.  You are at high risk for heart disease.  CANCER SCREENING   Lung Cancer  Lung cancer screening is recommended for adults 23-28 years old who are at high risk for lung cancer because of a history of smoking.  A yearly low-dose CT scan of the lungs is recommended for people who:  Currently smoke.  Have quit within the past 15 years.  Have at least a 30-pack-year history of smoking. A pack year is smoking an average of one pack of cigarettes a day for 1 year.  Yearly screening should continue until it has been 15 years since you quit.  Yearly screening should stop if you develop a health problem that would prevent you from having lung cancer treatment.  Breast Cancer  Practice breast self-awareness. This means understanding how your breasts normally appear and feel.  It also means  doing regular breast self-exams. Let your health care provider know about any changes, no matter how small.  If you are in your 20s or 30s, you should have a clinical breast exam (CBE) by a health care provider every 1-3 years as part of a regular health exam.  If you are 87 or older, have a CBE every year. Also consider having a breast X-ray (mammogram) every year.  If you have a family history of breast cancer, talk to your health care provider about genetic screening.  If you  are at high risk for breast cancer, talk to your health care provider about having an MRI and a mammogram every year.  Breast cancer gene (BRCA) assessment is recommended for women who have family members with BRCA-related cancers. BRCA-related cancers include:  Breast.  Ovarian.  Tubal.  Peritoneal cancers.  Results of the assessment will determine the need for genetic counseling and BRCA1 and BRCA2 testing. Cervical Cancer Your health care provider may recommend that you be screened regularly for cancer of the pelvic organs (ovaries, uterus, and vagina). This screening involves a pelvic examination, including checking for microscopic changes to the surface of your cervix (Pap test). You may be encouraged to have this screening done every 3 years, beginning at age 8.  For women ages 38-65, health care providers may recommend pelvic exams and Pap testing every 3 years, or they may recommend the Pap and pelvic exam, combined with testing for human papilloma virus (HPV), every 5 years. Some types of HPV increase your risk of cervical cancer. Testing for HPV may also be done on women of any age with unclear Pap test results.  Other health care providers may not recommend any screening for nonpregnant women who are considered low risk for pelvic cancer and who do not have symptoms. Ask your health care provider if a screening pelvic exam is right for you.  If you have had past treatment for cervical cancer or a condition that could lead to cancer, you need Pap tests and screening for cancer for at least 20 years after your treatment. If Pap tests have been discontinued, your risk factors (such as having a new sexual partner) need to be reassessed to determine if screening should resume. Some women have medical problems that increase the chance of getting cervical cancer. In these cases, your health care provider may recommend more frequent screening and Pap tests. Colorectal Cancer  This type of  cancer can be detected and often prevented.  Routine colorectal cancer screening usually begins at 65 years of age and continues through 65 years of age.  Your health care provider may recommend screening at an earlier age if you have risk factors for colon cancer.  Your health care provider may also recommend using home test kits to check for hidden blood in the stool.  A small camera at the end of a tube can be used to examine your colon directly (sigmoidoscopy or colonoscopy). This is done to check for the earliest forms of colorectal cancer.  Routine screening usually begins at age 60.  Direct examination of the colon should be repeated every 5-10 years through 65 years of age. However, you may need to be screened more often if early forms of precancerous polyps or small growths are found. Skin Cancer  Check your skin from head to toe regularly.  Tell your health care provider about any new moles or changes in moles, especially if there is a change in a mole's shape or  color.  Also tell your health care provider if you have a mole that is larger than the size of a pencil eraser.  Always use sunscreen. Apply sunscreen liberally and repeatedly throughout the day.  Protect yourself by wearing long sleeves, pants, a wide-brimmed hat, and sunglasses whenever you are outside. HEART DISEASE, DIABETES, AND HIGH BLOOD PRESSURE   High blood pressure causes heart disease and increases the risk of stroke. High blood pressure is more likely to develop in:  People who have blood pressure in the high end of the normal range (130-139/85-89 mm Hg).  People who are overweight or obese.  People who are African American.  If you are 19-23 years of age, have your blood pressure checked every 3-5 years. If you are 84 years of age or older, have your blood pressure checked every year. You should have your blood pressure measured twice--once when you are at a hospital or clinic, and once when you are  not at a hospital or clinic. Record the average of the two measurements. To check your blood pressure when you are not at a hospital or clinic, you can use:  An automated blood pressure machine at a pharmacy.  A home blood pressure monitor.  If you are between 78 years and 60 years old, ask your health care provider if you should take aspirin to prevent strokes.  Have regular diabetes screenings. This involves taking a blood sample to check your fasting blood sugar level.  If you are at a normal weight and have a low risk for diabetes, have this test once every three years after 65 years of age.  If you are overweight and have a high risk for diabetes, consider being tested at a younger age or more often. PREVENTING INFECTION  Hepatitis B  If you have a higher risk for hepatitis B, you should be screened for this virus. You are considered at high risk for hepatitis B if:  You were born in a country where hepatitis B is common. Ask your health care provider which countries are considered high risk.  Your parents were born in a high-risk country, and you have not been immunized against hepatitis B (hepatitis B vaccine).  You have HIV or AIDS.  You use needles to inject street drugs.  You live with someone who has hepatitis B.  You have had sex with someone who has hepatitis B.  You get hemodialysis treatment.  You take certain medicines for conditions, including cancer, organ transplantation, and autoimmune conditions. Hepatitis C  Blood testing is recommended for:  Everyone born from [birth year] through 1965.  Anyone with known risk factors for hepatitis C. Sexually transmitted infections (STIs)  You should be screened for sexually transmitted infections (STIs) including gonorrhea and chlamydia if:  You are sexually active and are younger than 65 years of age.  You are older than 65 years of age and your health care provider tells you that you are at risk for this type of  infection.  Your sexual activity has changed since you were last screened and you are at an increased risk for chlamydia or gonorrhea. Ask your health care provider if you are at risk.  If you do not have HIV, but are at risk, it may be recommended that you take a prescription medicine daily to prevent HIV infection. This is called pre-exposure prophylaxis (PrEP). You are considered at risk if:  You are sexually active and do not regularly use condoms or know the HIV status  of your partner(s).  You take drugs by injection.  You are sexually active with a partner who has HIV. Talk with your health care provider about whether you are at high risk of being infected with HIV. If you choose to begin PrEP, you should first be tested for HIV. You should then be tested every 3 months for as long as you are taking PrEP.  PREGNANCY   If you are premenopausal and you may become pregnant, ask your health care provider about preconception counseling.  If you may become pregnant, take 400 to 800 micrograms (mcg) of folic acid every day.  If you want to prevent pregnancy, talk to your health care provider about birth control (contraception). OSTEOPOROSIS AND MENOPAUSE   Osteoporosis is a disease in which the bones lose minerals and strength with aging. This can result in serious bone fractures. Your risk for osteoporosis can be identified using a bone density scan.  If you are 24 years of age or older, or if you are at risk for osteoporosis and fractures, ask your health care provider if you should be screened.  Ask your health care provider whether you should take a calcium or vitamin D supplement to lower your risk for osteoporosis.  Menopause may have certain physical symptoms and risks.  Hormone replacement therapy may reduce some of these symptoms and risks. Talk to your health care provider about whether hormone replacement therapy is right for you.  HOME CARE INSTRUCTIONS   Schedule regular  health, dental, and eye exams.  Stay current with your immunizations.   Do not use any tobacco products including cigarettes, chewing tobacco, or electronic cigarettes.  If you are pregnant, do not drink alcohol.  If you are breastfeeding, limit how much and how often you drink alcohol.  Limit alcohol intake to no more than 1 drink per day for nonpregnant women. One drink equals 12 ounces of beer, 5 ounces of wine, or 1 ounces of hard liquor.  Do not use street drugs.  Do not share needles.  Ask your health care provider for help if you need support or information about quitting drugs.  Tell your health care provider if you often feel depressed.  Tell your health care provider if you have ever been abused or do not feel safe at home.   This information is not intended to replace advice given to you by your health care provider. Make sure you discuss any questions you have with your health care provider.   Document Released: 09/23/2010 Document Revised: 03/31/2014 Document Reviewed: 02/09/2013 Elsevier Interactive Patient Education [employer].

## 2015-10-03 NOTE — Assessment & Plan Note (Addendum)
Continue calcium and vitamin d - taking a good amount Continue yoga, but start walking more Wants to avoid another medication, but advised most likely she will need a medication at some point given her age Did have some mild reflux with fosamax - could consider prolia -- information given Repeat dexa in Feb 2018 - given lifestyle changes, may also have a different insurance Decrease alcohol intake

## 2015-10-03 NOTE — Progress Notes (Signed)
Subjective:    Patient ID: Linda [name], female    DOB: Nov 18, 1950, 65 y.o.   MRN: [id number]  HPI She is here to establish with a new pcp.  She is here for a physical exam.   Osteoporosis:  She had a dexa earlier this year and has OP.  She did have decline in her density since two years ago.  She is taking calcium and vitamin d daily.  She is doing yoga regularly, some walking. Her Gyn discussed medication, but she declined.  She is unsure if she should consider it or not.   Medications and allergies reviewed with patient and updated if appropriate.  Patient Active Problem List   Diagnosis Date Noted  . History of colonic polyps 05/22/2014  . Hyperglycemia 09/04/2013  . Allergic rhinitis 08/06/2013  . ANXIETY STATE, UNSPECIFIED 11/29/2009  . VITAMIN D DEFICIENCY 04/10/2008  . HYPERLIPIDEMIA, WITH HIGH HDL 10/04/2007  . Osteoporosis 10/04/2007    Current Outpatient Prescriptions on File Prior to Visit  Medication Sig Dispense Refill  . Ascorbic Acid (VITAMIN C) 1000 MG tablet Take 1,000 mg by mouth daily.      . Calcium Carb-Cholecalciferol (CALCIUM 1000 + D PO) Take by mouth daily.      . cholecalciferol (VITAMIN D) 1000 UNITS tablet Take 1,600 Units by mouth daily.     No current facility-administered medications on file prior to visit.    Past Medical History  Diagnosis Date  . Hyperlipidemia   . Osteopenia   . Vitamin D deficiency     Past Surgical History  Procedure Laterality Date  . Tonsillectomy    . G 2 p 2    . Colonoscopy  03/2011    Dr Brodie;diminuitive polyp    Social History   Social History  . Marital Status: Married    Spouse Name: N/A  . Number of Children: 2  . Years of Education: N/A   Occupational History  . banking    Social History Main Topics  . Smoking status: Never Smoker   . Smokeless tobacco: Never Used  . Alcohol Use: 8.4 oz/week    14 Glasses of wine per week     Comment: wine nightly  . Drug Use: No  . Sexual  Activity: Not Asked   Other Topics Concern  . None   Social History Narrative   Exercise:  Yoga, doing some walking      Two children    Family History  Problem Relation Age of Onset  . Melanoma Father   . Hyperlipidemia Mother   . Heart disease Mother     no MI  . Colon cancer Neg Hx   . Stomach cancer Neg Hx   . Esophageal cancer Neg Hx   . Rectal cancer Neg Hx   . Stroke Neg Hx   . Diabetes Paternal Grandmother     vision loss  . Diabetes Maternal Uncle     Review of Systems  Constitutional: Negative for fever, chills, appetite change, fatigue and unexpected weight change.  Eyes: Negative for visual disturbance.  Respiratory: Negative for cough, shortness of breath and wheezing.   Cardiovascular: Negative for chest pain, palpitations and leg swelling.  Gastrointestinal: Negative for nausea, abdominal pain, diarrhea, constipation and blood in stool.       No gerd  Genitourinary: Negative for dysuria and hematuria.  Musculoskeletal: Negative for arthralgias.  Skin: Negative for color change and rash.  Neurological: Positive for headaches (sinus). Negative for dizziness  and light-headedness.  Psychiatric/Behavioral: Positive for dysphoric mood (mild -controlled with exercise, keeping busy). The patient is not nervous/anxious.        Objective:   Filed Vitals:   10/03/15 0800  BP: 124/86  Pulse: 71  Temp: 98.5 F (36.9 C)  Resp: 16   Filed Weights   10/03/15 0800  Weight: 134 lb (60.782 kg)   Body mass index is 22.3 kg/(m^2).   Physical Exam Constitutional: She appears well-developed and well-nourished. No distress.  HENT:  Head: Normocephalic and atraumatic.  Right Ear: External ear normal. Normal ear canal and TM Left Ear: External ear normal.  Normal ear canal and TM Mouth/Throat: Oropharynx is clear and moist.  Eyes: Conjunctivae and EOM are normal.  Neck: Neck supple. No tracheal deviation present. No thyromegaly present.  No carotid bruit    Cardiovascular: Normal rate, regular rhythm and normal heart sounds.   No murmur heard.  No edema. Pulmonary/Chest: Effort normal and breath sounds normal. No respiratory distress. She has no wheezes. She has no rales.  Breast: deferred to Gyn Abdominal: Soft. She exhibits no distension. There is no tenderness.  Lymphadenopathy: She has no cervical adenopathy.  Skin: Skin is warm and dry. She is not diaphoretic.  Psychiatric: She has a normal mood and affect. Her behavior is normal.         Assessment & Plan:   Physical exam: Screening blood work ordered Immunizations  Tetanus and shingles vaccines given today, will have prevnar at a later date or next year Colonoscopy  Up to date  Mammogram  Up to date  Gyn Up to date  Dexa  Up to date  - repeat in one year given OP and change in lifestyle - discussed medication Eye exams  Up to date  Exercise - regular, advised increasing walking for OP Weight - BMI normal, weight stable Skin  - sees derm every year, dad died of melanoma Substance abuse - has two drinks at night -discussed decreasing due to OP   See Problem List for Assessment and Plan of chronic medical problems.

## 2015-10-04 ENCOUNTER — Other Ambulatory Visit (INDEPENDENT_AMBULATORY_CARE_PROVIDER_SITE_OTHER): Payer: BLUE CROSS/BLUE SHIELD

## 2015-10-04 DIAGNOSIS — Z0000 Encounter for general adult medical examination without abnormal findings: Secondary | ICD-10-CM | POA: Diagnosis not present

## 2015-10-04 DIAGNOSIS — Z1159 Encounter for screening for other viral diseases: Secondary | ICD-10-CM

## 2015-10-04 DIAGNOSIS — Z114 Encounter for screening for human immunodeficiency virus [HIV]: Secondary | ICD-10-CM

## 2015-10-04 LAB — CBC WITH DIFFERENTIAL/PLATELET
BASOS ABS: 0 10*3/uL (ref 0.0–0.1)
Basophils Relative: 0.4 % (ref 0.0–3.0)
Eosinophils Absolute: 0.1 10*3/uL (ref 0.0–0.7)
Eosinophils Relative: 1.9 % (ref 0.0–5.0)
HCT: 39.1 % (ref 36.0–46.0)
HEMOGLOBIN: 13.5 g/dL (ref 12.0–15.0)
LYMPHS ABS: 1.9 10*3/uL (ref 0.7–4.0)
Lymphocytes Relative: 39.5 % (ref 12.0–46.0)
MCHC: 34.6 g/dL (ref 30.0–36.0)
MCV: 87 fl (ref 78.0–100.0)
MONO ABS: 0.4 10*3/uL (ref 0.1–1.0)
Monocytes Relative: 8.5 % (ref 3.0–12.0)
NEUTROS PCT: 49.7 % (ref 43.0–77.0)
Neutro Abs: 2.4 10*3/uL (ref 1.4–7.7)
Platelets: 237 10*3/uL (ref 150.0–400.0)
RBC: 4.5 Mil/uL (ref 3.87–5.11)
RDW: 13 % (ref 11.5–15.5)
WBC: 4.8 10*3/uL (ref 4.0–10.5)

## 2015-10-04 LAB — COMPREHENSIVE METABOLIC PANEL
ALK PHOS: 50 U/L (ref 39–117)
ALT: 10 U/L (ref 0–35)
AST: 15 U/L (ref 0–37)
Albumin: 4.6 g/dL (ref 3.5–5.2)
BILIRUBIN TOTAL: 0.6 mg/dL (ref 0.2–1.2)
BUN: 18 mg/dL (ref 6–23)
CO2: 30 mEq/L (ref 19–32)
CREATININE: 0.67 mg/dL (ref 0.40–1.20)
Calcium: 9.8 mg/dL (ref 8.4–10.5)
Chloride: 101 mEq/L (ref 96–112)
GFR: 93.84 mL/min (ref >60.00)
GLUCOSE: 105 mg/dL — AB (ref 70–99)
POTASSIUM: 3.8 meq/L (ref 3.5–5.1)
SODIUM: 139 meq/L (ref 135–145)
TOTAL PROTEIN: 7.3 g/dL (ref 6.0–8.3)

## 2015-10-04 LAB — LIPID PANEL
Cholesterol: 226 mg/dL — ABNORMAL HIGH (ref 0–200)
HDL: 101.7 mg/dL (ref >39.00)
LDL Cholesterol: 106 mg/dL — ABNORMAL HIGH (ref 0–99)
NONHDL: 123.84
Total CHOL/HDL Ratio: 2
Triglycerides: 88 mg/dL (ref 0.0–149.0)
VLDL: 17.6 mg/dL (ref 0.0–40.0)

## 2015-10-04 LAB — HIV ANTIBODY (ROUTINE TESTING W REFLEX): HIV: NONREACTIVE

## 2015-10-04 LAB — TSH: TSH: 1.92 u[IU]/mL (ref 0.35–4.50)

## 2015-10-05 ENCOUNTER — Encounter: Payer: Self-pay | Admitting: Internal Medicine

## 2015-10-05 ENCOUNTER — Ambulatory Visit: Payer: BLUE CROSS/BLUE SHIELD

## 2015-10-05 LAB — HEPATITIS C ANTIBODY: HCV Ab: NEGATIVE

## 2015-10-06 ENCOUNTER — Encounter: Payer: Self-pay | Admitting: Internal Medicine

## 2015-10-11 ENCOUNTER — Telehealth: Payer: Self-pay | Admitting: Internal Medicine

## 2015-10-11 DIAGNOSIS — R300 Dysuria: Secondary | ICD-10-CM

## 2015-10-11 DIAGNOSIS — E559 Vitamin D deficiency, unspecified: Secondary | ICD-10-CM

## 2015-10-11 MED ORDER — NITROFURANTOIN MONOHYD MACRO 100 MG PO CAPS: 100.0000 mg | ORAL_CAPSULE | Freq: Two times a day (BID) | ORAL | Status: DC

## 2015-10-11 MED ORDER — VITAMIN D 1000 UNITS PO TABS: 1600.0000 [IU] | ORAL_TABLET | Freq: Every day | ORAL | Status: AC

## 2015-10-11 NOTE — Telephone Encounter (Signed)
[facility]

## 2015-10-11 NOTE — Telephone Encounter (Signed)
Patient believes she has a UTI.  Would like a lab entered.   Also would like to know if she could get Vit B blood test done on Tuesday.

## 2015-10-11 NOTE — Telephone Encounter (Signed)
Please advise 

## 2015-10-11 NOTE — Telephone Encounter (Signed)
UA, UCx and vitamin D level ordered.  macrobid sent to pharmacy.

## 2015-10-11 NOTE — Telephone Encounter (Signed)
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  Patient states she is at work now and can not come to do lab until tomorrow.   Patient states that it is Vit D that she is wanting and not Vit B.  Patient states she is leaving tomorrow at 6pm to go to [facility].  Can something be sent to [facility] at [facility] to get her started?

## 2015-10-11 NOTE — Telephone Encounter (Signed)
Ok to order urine tests.  B12?  Insurance may or may not cover it - what is the reason for the test?

## 2015-10-12 ENCOUNTER — Telehealth: Payer: Self-pay | Admitting: Emergency Medicine

## 2015-10-12 ENCOUNTER — Other Ambulatory Visit (INDEPENDENT_AMBULATORY_CARE_PROVIDER_SITE_OTHER): Payer: BLUE CROSS/BLUE SHIELD

## 2015-10-12 DIAGNOSIS — R300 Dysuria: Secondary | ICD-10-CM | POA: Diagnosis not present

## 2015-10-12 DIAGNOSIS — E559 Vitamin D deficiency, unspecified: Secondary | ICD-10-CM

## 2015-10-12 LAB — URINALYSIS, ROUTINE W REFLEX MICROSCOPIC
Bilirubin Urine: NEGATIVE
KETONES UR: NEGATIVE
Leukocytes, UA: NEGATIVE
NITRITE: NEGATIVE
SPECIFIC GRAVITY, URINE: 1.01 (ref 1.000–1.030)
Total Protein, Urine: NEGATIVE
URINE GLUCOSE: NEGATIVE
UROBILINOGEN UA: 0.2 (ref 0.0–1.0)
pH: 5.5 (ref 5.0–8.0)

## 2015-10-12 LAB — VITAMIN D 25 HYDROXY (VIT D DEFICIENCY, FRACTURES): VITD: 34.75 ng/mL (ref 30.00–100.00)

## 2015-10-12 NOTE — Telephone Encounter (Signed)
A prescription is at her pof

## 2015-10-12 NOTE — Telephone Encounter (Signed)
Pt called and wanted to know if she can get a prescription for UTI. She is leaving to go out of town and keeps going to the bathroom. Stated she had an appt today but cant wait for results to come back. Please follow up. Thanks.

## 2015-10-12 NOTE — Telephone Encounter (Signed)
Spoke with pt to inform.  

## 2015-10-12 NOTE — Telephone Encounter (Signed)
Please advise. Culture will not be back until Monday.

## 2015-10-12 NOTE — Telephone Encounter (Signed)
Pt called back. Is going to call back Monday when the culture comes back with a pharmacy in [place]. Thanks.

## 2015-10-14 LAB — URINE CULTURE

## 2015-10-17 ENCOUNTER — Ambulatory Visit (INDEPENDENT_AMBULATORY_CARE_PROVIDER_SITE_OTHER): Payer: BLUE CROSS/BLUE SHIELD

## 2015-10-17 DIAGNOSIS — Z23 Encounter for immunization: Secondary | ICD-10-CM | POA: Diagnosis not present

## 2016-02-12 ENCOUNTER — Encounter: Payer: Self-pay | Admitting: Family Medicine

## 2016-02-12 ENCOUNTER — Ambulatory Visit (INDEPENDENT_AMBULATORY_CARE_PROVIDER_SITE_OTHER): Payer: BLUE CROSS/BLUE SHIELD | Admitting: Family Medicine

## 2016-02-12 VITALS — BP 118/82 | HR 73 | Temp 98.9°F | Ht 65.0 in | Wt 139.5 lb

## 2016-02-12 DIAGNOSIS — H1033 Unspecified acute conjunctivitis, bilateral: Secondary | ICD-10-CM | POA: Diagnosis not present

## 2016-02-12 DIAGNOSIS — B9789 Other viral agents as the cause of diseases classified elsewhere: Secondary | ICD-10-CM

## 2016-02-12 DIAGNOSIS — J069 Acute upper respiratory infection, unspecified: Secondary | ICD-10-CM

## 2016-02-12 MED ORDER — ERYTHROMYCIN 5 MG/GM OP OINT: 1.0000 "application " | TOPICAL_OINTMENT | Freq: Every day | OPHTHALMIC | 0 refills | Status: DC

## 2016-02-12 NOTE — Progress Notes (Signed)
HPI:  Acute visit for "eye infection": -Reports grandchildren sick and she has had upper respiratory symptoms for the last 5-7 days -Symptoms have included nasal congestion, postnasal drip, mild cough headaches and itchy watery eyes -Eyes got worse the last few days with pus from both eyes, R>L -She had some eyedrops at home from a prior eye infection that contain a steroid and an antibiotic and she did try yesterday but wondered if she should continue this -No vision changes, fevers, shortness of breath, vomiting, diarrhea or skin rash -Wears contacts and has removed her contacts  ROS: See pertinent positives and negatives per HPI.  Past Medical History:  Diagnosis Date  . Hyperlipidemia   . Osteopenia   . Vitamin D deficiency     Past Surgical History:  Procedure Laterality Date  . COLONOSCOPY  03/2011   Dr Brodie;diminuitive polyp  . G 2 P 2    . TONSILLECTOMY      Family History  Problem Relation Age of Onset  . Melanoma Father   . Hyperlipidemia Mother   . Heart disease Mother     no MI  . Colon cancer Neg Hx   . Stomach cancer Neg Hx   . Esophageal cancer Neg Hx   . Rectal cancer Neg Hx   . Stroke Neg Hx   . Diabetes Paternal Grandmother     vision loss  . Diabetes Maternal Uncle     Social History   Social History  . Marital status: Married    Spouse name: N/A  . Number of children: 2  . Years of education: N/A   Occupational History  . banking    Social History Main Topics  . Smoking status: Never Smoker  . Smokeless tobacco: Never Used  . Alcohol use 8.4 oz/week    14 Glasses of wine per week     Comment: wine nightly  . Drug use: No  . Sexual activity: Not Asked   Other Topics Concern  . None   Social History Narrative   Exercise:  Yoga, doing some walking      Two children     Current Outpatient Prescriptions:  .  Ascorbic Acid (VITAMIN C) 1000 MG tablet, Take 1,000 mg by mouth daily.  , Disp: , Rfl:  .  Calcium  Carb-Cholecalciferol (CALCIUM 1000 + D PO), Take by mouth daily.  , Disp: , Rfl:  .  cholecalciferol (VITAMIN D) 1000 units tablet, Take 1.5 tablets (1,500 Units total) by mouth daily., Disp: 90 tablet, Rfl: 2 .  erythromycin ophthalmic ointment, Place 1 application into the left eye at bedtime. For 5-7 days, Disp: 3.5 g, Rfl: 0  EXAM:  Vitals:   02/12/16 1643  BP: 118/82  Pulse: 73  Temp: 98.9 F (37.2 C)    Body mass index is 23.21 kg/m.  GENERAL: vitals reviewed and listed above, alert, oriented, appears well hydrated and in no acute distress  HEENT: atraumatic, Conjunctiva mildly erythematous bilaterally small amount of pus in the corner of the right eye, PERRLA, EOMI, clear drainage from both eyes, visual acuity grossly intact  no obvious abnormalities on inspection of external nose and ears, normal appearance of ear canals and TMs, clear nasal congestion, mild post oropharyngeal erythema with PND, no tonsillar edema or exudate, no sinus TTP   NECK: no obvious masses on inspection  LUNGS: clear to auscultation bilaterally, no wheezes, rales or rhonchi, good air movement  CV: HRRR, no peripheral edema  MS: moves all extremities without  noticeable abnormality  PSYCH: pleasant and cooperative, no obvious depression or anxiety  ASSESSMENT AND PLAN:  Discussed the following assessment and plan:  Acute conjunctivitis of both eyes, unspecified acute conjunctivitis type -Discontinue eyedrops -Treat with Rocephin ophthalmic ointment instead and compresses -Advised ophthalmology evaluation if worsening, does not respond to treatment or if has recurrent symptoms  Upper respiratory infection, viral -Symptomatic care recommendations provided  -Return precautions discussed   -Patient advised to return or notify a doctor immediately if symptoms worsen or persist or new concerns arise.  Patient Instructions  Use the ointment in the eye before bed for 5-7 days. Compresses several  times per day. See your eye doctor if worsening, does not resolve with treatment or if recurrent.   INSTRUCTIONS FOR UPPER RESPIRATORY INFECTION:  -plenty of rest and fluids  -nasal saline wash 2-3 times daily (use prepackaged nasal saline or bottled/distilled water if making your own)   -can use tylenol (in no history of liver disease) or ibuprofen (if no history of kidney disease, bowel bleeding or significant heart disease) as directed for aches and sorethroat  -in the winter time, using a humidifier at night is helpful (please follow cleaning instructions)  -if you are taking a cough medication - use only as directed, may also try a teaspoon of honey to coat the throat and throat lozenges.   -for sore throat, salt water gargles can help  -follow up if you have fevers, facial pain, tooth pain, difficulty breathing or are worsening or symptoms persist longer then expected  Upper Respiratory Infection, Adult An upper respiratory infection (URI) is also known as the common cold. It is often caused by a type of germ (virus). Colds are easily spread (contagious). You can pass it to others by kissing, coughing, sneezing, or drinking out of the same glass. Usually, you get better in 1 to 3  weeks.  However, the cough can last for even longer. HOME CARE   Only take medicine as told by your doctor. Follow instructions provided above.  Drink enough water and fluids to keep your pee (urine) clear or pale yellow.  Get plenty of rest.  Return to work when your temperature is < 100 for 24 hours or as told by your doctor. You may use a face mask and wash your hands to stop your cold from spreading. GET HELP RIGHT AWAY IF:   After the first few days, you feel you are getting worse.  You have questions about your medicine.  You have chills, shortness of breath, or red spit (mucus).  You have pain in the face for more then 1-2 days, especially when you bend forward.  You have a fever, puffy  (swollen) neck, pain when you swallow, or white spots in the back of your throat.  You have a bad headache, ear pain, sinus pain, or chest pain.  You have a high-pitched whistling sound when you breathe in and out (wheezing).  You cough up blood.  You have sore muscles or a stiff neck. MAKE SURE YOU:   Understand these instructions.  Will watch your condition.  Will get help right away if you are not doing well or get worse. Document Released: 08/27/2007 Document Revised: 06/02/2011 Document Reviewed: 06/15/2013 [facility] Patient Information 2015 [place], [place]. This information is not intended to replace advice given to you by your health care provider. Make sure you discuss any questions you have with your health care provider.    Colin Benton R., DO

## 2016-02-12 NOTE — Patient Instructions (Signed)
Use the ointment in the eye before bed for 5-7 days. Compresses several times per day. See your eye doctor if worsening, does not resolve with treatment or if recurrent.   INSTRUCTIONS FOR UPPER RESPIRATORY INFECTION:  -plenty of rest and fluids  -nasal saline wash 2-3 times daily (use prepackaged nasal saline or bottled/distilled water if making your own)   -can use tylenol (in no history of liver disease) or ibuprofen (if no history of kidney disease, bowel bleeding or significant heart disease) as directed for aches and sorethroat  -in the winter time, using a humidifier at night is helpful (please follow cleaning instructions)  -if you are taking a cough medication - use only as directed, may also try a teaspoon of honey to coat the throat and throat lozenges.   -for sore throat, salt water gargles can help  -follow up if you have fevers, facial pain, tooth pain, difficulty breathing or are worsening or symptoms persist longer then expected  Upper Respiratory Infection, Adult An upper respiratory infection (URI) is also known as the common cold. It is often caused by a type of germ (virus). Colds are easily spread (contagious). You can pass it to others by kissing, coughing, sneezing, or drinking out of the same glass. Usually, you get better in 1 to 3  weeks.  However, the cough can last for even longer. HOME CARE   Only take medicine as told by your doctor. Follow instructions provided above.  Drink enough water and fluids to keep your pee (urine) clear or pale yellow.  Get plenty of rest.  Return to work when your temperature is < 100 for 24 hours or as told by your doctor. You may use a face mask and wash your hands to stop your cold from spreading. GET HELP RIGHT AWAY IF:   After the first few days, you feel you are getting worse.  You have questions about your medicine.  You have chills, shortness of breath, or red spit (mucus).  You have pain in the face for more then  1-2 days, especially when you bend forward.  You have a fever, puffy (swollen) neck, pain when you swallow, or white spots in the back of your throat.  You have a bad headache, ear pain, sinus pain, or chest pain.  You have a high-pitched whistling sound when you breathe in and out (wheezing).  You cough up blood.  You have sore muscles or a stiff neck. MAKE SURE YOU:   Understand these instructions.  Will watch your condition.  Will get help right away if you are not doing well or get worse. Document Released: 08/27/2007 Document Revised: 06/02/2011 Document Reviewed: 06/15/2013 [facility] Patient Information 2015 [place], [place]. This information is not intended to replace advice given to you by your health care provider. Make sure you discuss any questions you have with your health care provider.

## 2016-02-12 NOTE — Progress Notes (Signed)
Pre visit review using our clinic review tool, if applicable. No additional management support is needed unless otherwise documented below in the visit note. 

## 2016-03-19 ENCOUNTER — Encounter: Payer: Self-pay | Admitting: Adult Health

## 2016-03-19 ENCOUNTER — Ambulatory Visit (INDEPENDENT_AMBULATORY_CARE_PROVIDER_SITE_OTHER): Payer: BLUE CROSS/BLUE SHIELD | Admitting: Adult Health

## 2016-03-19 VITALS — BP 112/64 | Temp 98.1°F | Wt 134.0 lb

## 2016-03-19 DIAGNOSIS — R6889 Other general symptoms and signs: Secondary | ICD-10-CM | POA: Diagnosis not present

## 2016-03-19 LAB — POCT INFLUENZA A/B
Influenza A, POC: NEGATIVE
Influenza B, POC: NEGATIVE

## 2016-03-19 NOTE — Progress Notes (Signed)
Subjective:    Patient ID: Linda [name], female    DOB: 30-Dec-1950, 65 y.o.   MRN: [id number]  Influenza  This is a new problem. Episode onset: 3 days  The problem has been unchanged. Associated symptoms include anorexia, chills, coughing (dry ), diaphoresis, fatigue, a fever (subjective), headaches and myalgias. She has tried acetaminophen for the symptoms. The treatment provided mild relief.    Review of Systems  Constitutional: Positive for chills, diaphoresis, fatigue and fever (subjective).  Respiratory: Positive for cough (dry ).   Gastrointestinal: Positive for anorexia.  Musculoskeletal: Positive for myalgias.  Neurological: Positive for headaches.   Past Medical History:  Diagnosis Date  . Hyperlipidemia   . Osteopenia   . Vitamin D deficiency     Social History   Social History  . Marital status: Married    Spouse name: N/A  . Number of children: 2  . Years of education: N/A   Occupational History  . banking    Social History Main Topics  . Smoking status: Never Smoker  . Smokeless tobacco: Never Used  . Alcohol use 8.4 oz/week    14 Glasses of wine per week     Comment: wine nightly  . Drug use: No  . Sexual activity: Not on file   Other Topics Concern  . Not on file   Social History Narrative   Exercise:  Yoga, doing some walking      Two children    Past Surgical History:  Procedure Laterality Date  . COLONOSCOPY  03/2011   Dr Brodie;diminuitive polyp  . G 2 P 2    . TONSILLECTOMY      Family History  Problem Relation Age of Onset  . Melanoma Father   . Hyperlipidemia Mother   . Heart disease Mother     no MI  . Colon cancer Neg Hx   . Stomach cancer Neg Hx   . Esophageal cancer Neg Hx   . Rectal cancer Neg Hx   . Stroke Neg Hx   . Diabetes Paternal Grandmother     vision loss  . Diabetes Maternal Uncle     No Known Allergies  Current Outpatient Prescriptions on File Prior to Visit  Medication Sig Dispense Refill  .  Ascorbic Acid (VITAMIN C) 1000 MG tablet Take 1,000 mg by mouth daily.      . Calcium Carb-Cholecalciferol (CALCIUM 1000 + D PO) Take by mouth daily.      . cholecalciferol (VITAMIN D) 1000 units tablet Take 1.5 tablets (1,500 Units total) by mouth daily. 90 tablet 2   No current facility-administered medications on file prior to visit.     BP 112/64   Temp 98.1 F (36.7 C) (Oral)   Wt 134 lb (60.8 kg)   BMI 22.30 kg/m       Objective:   Physical Exam  Constitutional: She is oriented to person, place, and time. She appears well-developed and well-nourished. No distress.  HENT:  Head: Normocephalic and atraumatic.  Right Ear: External ear normal.  Left Ear: External ear normal.  Nose: Nose normal.  Mouth/Throat: Oropharynx is clear and moist. No oropharyngeal exudate.  Eyes: Conjunctivae and EOM are normal. Pupils are equal, round, and reactive to light. Right eye exhibits no discharge. Left eye exhibits no discharge. No scleral icterus.  Cardiovascular: Normal rate, regular rhythm, normal heart sounds and intact distal pulses.  Exam reveals no gallop and no friction rub.   No murmur heard. Pulmonary/Chest: Effort normal  and breath sounds normal.  Abdominal: Soft. Bowel sounds are normal.  Musculoskeletal: Normal range of motion. She exhibits no edema, tenderness or deformity.  Neurological: She is alert and oriented to person, place, and time. She has normal reflexes.  Skin: Skin is warm and dry. No rash noted. She is not diaphoretic. No erythema. No pallor.  Psychiatric: She has a normal mood and affect. Her behavior is normal. Judgment and thought content normal.  Nursing note and vitals reviewed.     Assessment & Plan:  1. Flu-like symptoms - POC Influenza A/B- Negative.  - low suspicion for bacterial illness.  - Advised conservative measures.  - Follow up if no improvement  [employer], AGNP

## 2016-03-26 ENCOUNTER — Encounter: Payer: Self-pay | Admitting: Family Medicine

## 2016-03-26 ENCOUNTER — Ambulatory Visit (INDEPENDENT_AMBULATORY_CARE_PROVIDER_SITE_OTHER): Payer: BLUE CROSS/BLUE SHIELD | Admitting: Family Medicine

## 2016-03-26 ENCOUNTER — Telehealth: Payer: Self-pay | Admitting: Internal Medicine

## 2016-03-26 VITALS — BP 110/60 | HR 76 | Temp 98.9°F | Wt 137.7 lb

## 2016-03-26 DIAGNOSIS — J069 Acute upper respiratory infection, unspecified: Secondary | ICD-10-CM | POA: Diagnosis not present

## 2016-03-26 DIAGNOSIS — B9789 Other viral agents as the cause of diseases classified elsewhere: Secondary | ICD-10-CM | POA: Diagnosis not present

## 2016-03-26 DIAGNOSIS — H109 Unspecified conjunctivitis: Secondary | ICD-10-CM | POA: Diagnosis not present

## 2016-03-26 MED ORDER — HYDROCODONE-HOMATROPINE 5-1.5 MG/5ML PO SYRP: 5.0000 mL | ORAL_SOLUTION | Freq: Four times a day (QID) | ORAL | 0 refills | Status: DC | PRN

## 2016-03-26 NOTE — Patient Instructions (Signed)

## 2016-03-26 NOTE — Telephone Encounter (Signed)
Patient Name: Linda [name] DOB: 09/23/1950 Initial Comment Caller States she still has a cough, would like to be seen Nurse Assessment Nurse: Marcelline Deist, RN, Kermit Balo Date/Time (Eastern Time): 03/26/2016 8:47:06 AM Confirm and document reason for call. If symptomatic, describe symptoms. ---Caller states she still has a cough, would like to be seen. Was seen before Christmas, said it was virus. Has eye infections as well. Using antibiotic ointment. No fever. Does the patient have any new or worsening symptoms? ---Yes Will a triage be completed? ---Yes Related visit to physician within the last 2 weeks? ---Yes Does the PT have any chronic conditions? (i.e. diabetes, asthma, etc.) ---No Is this a behavioral health or substance abuse call? ---No Guidelines Guideline Title Affirmed Question Affirmed Notes Cough - Acute Productive SEVERE coughing spells (e.g., whooping sound after coughing, vomiting after coughing) Final Disposition User See Physician within [place], RN, Kermit Balo Comments Caller scheduled at [facility] to be seen tonight. She is also requesting a cough rx be called in for her if possible to the [facility] at [place]. NKDA. Referrals REFERRED TO PCP OFFICE Disagree/Comply: Comply

## 2016-03-26 NOTE — Progress Notes (Signed)
Pre visit review using our clinic review tool, if applicable. No additional management support is needed unless otherwise documented below in the visit note. 

## 2016-03-26 NOTE — Progress Notes (Signed)
Subjective:     Patient ID: Linda [name], female   DOB: Jan 17, 1951, 66 y.o.   MRN: [id number]  HPI Patient seen for follow-up regarding recent respiratory symptoms. She was seen here a week ago with presumed viral illness. States she actually is feeling somewhat better. She has only minimal cough at this point and nasal congestion is clearing. No fever. She had conjunctivitis back in November and received erythromycin ointment. She had recurrent eye symptoms couple days ago and started back using erythromycin ointment and symptoms are improving. No blurred vision. No eye pain. Early morning matting which improves with moist heat  Past Medical History:  Diagnosis Date  . Hyperlipidemia   . Osteopenia   . Vitamin D deficiency    Past Surgical History:  Procedure Laterality Date  . COLONOSCOPY  03/2011   Dr Brodie;diminuitive polyp  . G 2 P 2    . TONSILLECTOMY      reports that she has never smoked. She has never used smokeless tobacco. She reports that she drinks about 8.4 oz of alcohol per week . She reports that she does not use drugs. family history includes Diabetes in her maternal uncle and paternal grandmother; Heart disease in her mother; Hyperlipidemia in her mother; Melanoma in her father. No Known Allergies   Review of Systems  Constitutional: Negative for chills and fever.  HENT: Positive for congestion.   Eyes: Positive for redness. Negative for pain, discharge and itching.  Respiratory: Positive for cough.        Objective:   Physical Exam  Constitutional: She appears well-developed and well-nourished.  HENT:  Right Ear: External ear normal.  Left Ear: External ear normal.  Mouth/Throat: Oropharynx is clear and moist.  Eyes:  Left conjunctiva minimal erythema. Right appears normal. No obvious purulent drainage. Pupils equal round reactive to light. Cornea appears normal bilaterally  Neck: Neck supple.  Cardiovascular: Normal rate and regular rhythm.    Pulmonary/Chest: Effort normal and breath sounds normal. No respiratory distress. She has no wheezes. She has no rales.  Lymphadenopathy:    She has no cervical adenopathy.       Assessment:     #1 resolving viral URI with cough  #2 recurrent conjunctivitis left eye improving    Plan:     -Continue erythromycin ointment -Continue warm compresses several times daily -Follow-up promptly for any fever -Follow-up if eye symptoms not fully resolved in 2-3 more days  Eulas Post MD [facility]

## 2016-04-03 ENCOUNTER — Telehealth: Payer: Self-pay | Admitting: Internal Medicine

## 2016-04-03 NOTE — Telephone Encounter (Signed)
The last time she was seen her symptoms were better and mild.  Not sure if this is the same illness of different ones.    I have reviewed the notes from her last three visits - each sounded like a viral illness - likely two different illnesses.  She should be seen ideally - it is hard for me to say what is going on without seeing her.

## 2016-04-03 NOTE — Telephone Encounter (Signed)
Pt request to speak to the assistant concern about her condition. She saw Doctor twice since [place] and she is not any better. Request to speak to the a assistant first.

## 2016-04-03 NOTE — Telephone Encounter (Signed)
Pt states she still has a Non-productive cough, sore throat, weakness. She has been seen multiple times since November and would like to know what can be done. She was never given an antibiotic and did not use the cough syrup given. Please advise.

## 2016-04-04 NOTE — Telephone Encounter (Signed)
Spoke with pt to inform. Will contact pt again after lunch to schedule an appt tomorrow with [facility].

## 2016-04-04 NOTE — Telephone Encounter (Signed)
LVM for pt, scheduled appt for Sat at 945

## 2016-04-05 ENCOUNTER — Ambulatory Visit (INDEPENDENT_AMBULATORY_CARE_PROVIDER_SITE_OTHER): Payer: BLUE CROSS/BLUE SHIELD | Admitting: Family

## 2016-04-05 ENCOUNTER — Encounter: Payer: Self-pay | Admitting: Family

## 2016-04-05 VITALS — BP 120/65 | HR 70 | Temp 97.0°F | Ht 65.0 in | Wt 134.1 lb

## 2016-04-05 DIAGNOSIS — R05 Cough: Secondary | ICD-10-CM | POA: Diagnosis not present

## 2016-04-05 DIAGNOSIS — M545 Low back pain, unspecified: Secondary | ICD-10-CM

## 2016-04-05 DIAGNOSIS — R058 Other specified cough: Secondary | ICD-10-CM

## 2016-04-05 MED ORDER — BENZONATATE 100 MG PO CAPS: 100.0000 mg | ORAL_CAPSULE | Freq: Two times a day (BID) | ORAL | 0 refills | Status: DC | PRN

## 2016-04-05 NOTE — Progress Notes (Signed)
Pre visit review using our clinic review tool, if applicable. No additional management support is needed unless otherwise documented below in the visit note. 

## 2016-04-05 NOTE — Patient Instructions (Signed)
Suspect post viral cough.   Tessalon as needed. Honey for cough.   Suspect muscle sprain. Please stay vigilant with this symptom.   Let's treat conservatively as we discussed.   Heat and ice.   Over-the-counter medications you may try for arthritic pain include:   ThermaCare patches   Capsaicin cream   Icy hot Aspercreme   If conservative treatment doesn't yield results, we will consider physical therapy, consult to Sports Medicine/Orthopedics for further evaluation, and imaging.   If there is no improvement in your symptoms, or if there is any worsening of symptoms, or if you have any additional concerns, please return for re-evaluation; or, if we are closed, consider going to the Emergency Room for evaluation if symptoms urgent.

## 2016-04-05 NOTE — Progress Notes (Signed)
Subjective:    Patient ID: Linda [name], female    DOB: 1951/02/19, 66 y.o.   MRN: [id number]  CC: Linda [name] is a 66 y.o. female who presents today for an acute visit.    HPI: CC: dry cough x 3 weeks, improved. Slight intermittent HA. No SOB, wheezing, sinus pressure.   No GERD. No epigastric burning.   She also describes right low back pain x 2 months, unchanged.  Pain with touching and lying on right side. Describes dull ache.  No dysuria, rash, N, V. Normal BMs. No h/o renal stones. Thinking maybe picking up grandchild. No numbness, tingling in legs. No injury. Hasn't tried any medication for pain.   No eye complaints today.    Seen for cough 12/27 and 1/3. Didn't try codeine syrup. Tried delsym without relief.      HISTORY:  Past Medical History:  Diagnosis Date  . Hyperlipidemia   . Osteopenia   . Vitamin D deficiency    Past Surgical History:  Procedure Laterality Date  . COLONOSCOPY  03/2011   Dr Brodie;diminuitive polyp  . G 2 P 2    . TONSILLECTOMY     Family History  Problem Relation Age of Onset  . Melanoma Father   . Hyperlipidemia Mother   . Heart disease Mother     no MI  . Colon cancer Neg Hx   . Stomach cancer Neg Hx   . Esophageal cancer Neg Hx   . Rectal cancer Neg Hx   . Stroke Neg Hx   . Diabetes Paternal Grandmother     vision loss  . Diabetes Maternal Uncle     Allergies: Patient has no known allergies. Current Outpatient Prescriptions on File Prior to Visit  Medication Sig Dispense Refill  . Ascorbic Acid (VITAMIN C) 1000 MG tablet Take 1,000 mg by mouth daily.      . Calcium Carb-Cholecalciferol (CALCIUM 1000 + D PO) Take by mouth daily.      . cholecalciferol (VITAMIN D) 1000 units tablet Take 1.5 tablets (1,500 Units total) by mouth daily. 90 tablet 2  . HYDROcodone-homatropine (HYCODAN) 5-1.5 MG/5ML syrup Take 5 mLs by mouth every 6 (six) hours as needed. (Patient not taking: Reported on 04/05/2016) 120 mL 0   No current  facility-administered medications on file prior to visit.     Social History  Substance Use Topics  . Smoking status: Never Smoker  . Smokeless tobacco: Never Used  . Alcohol use 8.4 oz/week    14 Glasses of wine per week     Comment: wine nightly    Review of Systems  Constitutional: Negative for chills and fever.  HENT: Negative for congestion, postnasal drip, sinus pain and sore throat.   Respiratory: Positive for cough. Negative for shortness of breath and wheezing.   Cardiovascular: Negative for chest pain and palpitations.  Gastrointestinal: Negative for nausea and vomiting.  Genitourinary: Negative for dysuria, flank pain and frequency.  Musculoskeletal: Positive for back pain.  Neurological: Positive for headaches (slight). Negative for weakness.      Objective:    BP 120/65   Pulse 70   Temp 97 F (36.1 C) (Oral)   Ht 5\' 5"  (1.651 m)   Wt 134 lb 1.9 oz (60.8 kg)   SpO2 97%   BMI 22.32 kg/m   BP Readings from Last 3 Encounters:  04/05/16 120/65  03/26/16 110/60  03/19/16 112/64    Physical Exam  Constitutional: She appears well-developed and well-nourished.  HENT:  Head: Normocephalic and atraumatic.  Right Ear: Hearing, tympanic membrane, external ear and ear canal normal. No drainage, swelling or tenderness. No foreign bodies. Tympanic membrane is not erythematous and not bulging. No middle ear effusion. No decreased hearing is noted.  Left Ear: Hearing, tympanic membrane, external ear and ear canal normal. No drainage, swelling or tenderness. No foreign bodies. Tympanic membrane is not erythematous and not bulging.  No middle ear effusion. No decreased hearing is noted.  Nose: Nose normal. No rhinorrhea. Right sinus exhibits no maxillary sinus tenderness and no frontal sinus tenderness. Left sinus exhibits no maxillary sinus tenderness and no frontal sinus tenderness.  Mouth/Throat: Uvula is midline, oropharynx is clear and moist and mucous membranes are  normal. No oropharyngeal exudate, posterior oropharyngeal edema, posterior oropharyngeal erythema or tonsillar abscesses.  Eyes: Conjunctivae are normal.  Cardiovascular: Normal rate, regular rhythm, normal heart sounds and normal pulses.   Pulmonary/Chest: Effort normal and breath sounds normal. She has no wheezes. She has no rhonchi. She has no rales.  Abdominal: There is no CVA tenderness.  Musculoskeletal:       Right shoulder: She exhibits tenderness. She exhibits normal range of motion, no bony tenderness, no pain and no spasm.       Lumbar back: She exhibits normal range of motion, no tenderness, no bony tenderness, no swelling, no edema, no pain and no spasm.       Arms: Full range of motion with flexion, tension, lateral side bends. No bony tenderness. Tenderness noted right lower back. No rash.    Lymphadenopathy:       Head (right side): No submental, no submandibular, no tonsillar, no preauricular, no posterior auricular and no occipital adenopathy present.       Head (left side): No submental, no submandibular, no tonsillar, no preauricular, no posterior auricular and no occipital adenopathy present.    She has no cervical adenopathy.  Neurological: She is alert. She has normal strength. No sensory deficit.  Reflex Scores:      Patellar reflexes are 2+ on the right side and 2+ on the left side. Sensation and strength intact bilateral lower extremities.  Skin: Skin is warm and dry.  Psychiatric: She has a normal mood and affect. Her speech is normal and behavior is normal. Thought content normal.  Vitals reviewed.      Assessment & Plan:  1. Acute right-sided low back pain without sciatica Suspect muscle sprain. Patient declined prescription management and would like to try over the counter medication.   2. Post-viral cough syndrome sao2 97. No adventitious lung sounds. Afebrile. Reassured that resolved. Education provided on post viral cough. We agreed on continued  conservative therapy with tessalon PRN, honey.   - benzonatate (TESSALON) 100 MG capsule; Take 1 capsule (100 mg total) by mouth 2 (two) times daily as needed for cough.  Dispense: 20 capsule; Refill: 0     I have discontinued Ms. Macfadden's erythromycin and tobramycin-dexamethasone. I am also having her maintain her Calcium Carb-Cholecalciferol (CALCIUM 1000 + D PO), vitamin C, cholecalciferol, and HYDROcodone-homatropine.   No orders of the defined types were placed in this encounter.   Return precautions given.   Risks, benefits, and alternatives of the medications and treatment plan prescribed today were discussed, and patient expressed understanding.   Education regarding symptom management and diagnosis given to patient on AVS.  Continue to follow with Binnie Rail, MD for routine health maintenance.   Linda [name] and I agreed with plan.  Mable Paris, FNP

## 2016-04-15 LAB — HM MAMMOGRAPHY

## 2016-04-17 ENCOUNTER — Encounter: Payer: Self-pay | Admitting: Internal Medicine

## 2016-04-30 ENCOUNTER — Ambulatory Visit (INDEPENDENT_AMBULATORY_CARE_PROVIDER_SITE_OTHER): Payer: BLUE CROSS/BLUE SHIELD

## 2016-04-30 DIAGNOSIS — Z23 Encounter for immunization: Secondary | ICD-10-CM

## 2016-07-02 ENCOUNTER — Encounter: Payer: Self-pay | Admitting: Internal Medicine

## 2016-07-02 ENCOUNTER — Ambulatory Visit (INDEPENDENT_AMBULATORY_CARE_PROVIDER_SITE_OTHER): Payer: BLUE CROSS/BLUE SHIELD | Admitting: Internal Medicine

## 2016-07-02 ENCOUNTER — Other Ambulatory Visit: Payer: Self-pay | Admitting: Internal Medicine

## 2016-07-02 DIAGNOSIS — R05 Cough: Secondary | ICD-10-CM | POA: Diagnosis not present

## 2016-07-02 DIAGNOSIS — R059 Cough, unspecified: Secondary | ICD-10-CM

## 2016-07-02 LAB — POCT EXHALED NITRIC OXIDE: FeNO level (ppb): 26

## 2016-07-02 MED ORDER — ALENDRONATE SODIUM 70 MG PO TABS: 70.0000 mg | ORAL_TABLET | ORAL | 11 refills | Status: DC

## 2016-07-02 MED ORDER — BUDESONIDE-FORMOTEROL FUMARATE 80-4.5 MCG/ACT IN AERO: 2.0000 | INHALATION_SPRAY | Freq: Two times a day (BID) | RESPIRATORY_TRACT | 0 refills | Status: DC

## 2016-07-02 NOTE — Progress Notes (Signed)
Subjective:    Patient ID: Linda [name], female    DOB: 1950-05-21, 66 y.o.   MRN: [id number]  HPI She is here for an acute visit for cold symptoms.   Her symptoms started in December. She has been seen for this 3 times.   She is experiencing a phlegmy cough.  She sometimes brings up sputum.  She does not have issues sleeping due to the cough.    It has not changed improved recently.  She denies SOB, wheeze, chest tightness, fever, chills, and cold symptoms.    She has heartburn frequently.  She has started taking fosamax since she was here last.  She denies history of allergies or allergy symptoms.   Medications and allergies reviewed with patient and updated if appropriate.  Patient Active Problem List   Diagnosis Date Noted  . History of colonic polyps 05/22/2014  . Hyperglycemia 09/04/2013  . Allergic rhinitis 08/06/2013  . ANXIETY STATE, UNSPECIFIED 11/29/2009  . Vitamin D deficiency 04/10/2008  . HYPERLIPIDEMIA, WITH HIGH HDL 10/04/2007  . Osteoporosis 10/04/2007    Current Outpatient Prescriptions on File Prior to Visit  Medication Sig Dispense Refill  . Ascorbic Acid (VITAMIN C) 1000 MG tablet Take 1,000 mg by mouth daily.      . Calcium Carb-Cholecalciferol (CALCIUM 1000 + D PO) Take by mouth daily.      . cholecalciferol (VITAMIN D) 1000 units tablet Take 1.5 tablets (1,500 Units total) by mouth daily. 90 tablet 2   No current facility-administered medications on file prior to visit.     Past Medical History:  Diagnosis Date  . Hyperlipidemia   . Osteopenia   . Vitamin D deficiency     Past Surgical History:  Procedure Laterality Date  . COLONOSCOPY  03/2011   Dr Brodie;diminuitive polyp  . G 2 P 2    . TONSILLECTOMY      Social History   Social History  . Marital status: Married    Spouse name: N/A  . Number of children: 2  . Years of education: N/A   Occupational History  . banking    Social History Main Topics  . Smoking status:  Never Smoker  . Smokeless tobacco: Never Used  . Alcohol use 8.4 oz/week    14 Glasses of wine per week     Comment: wine nightly  . Drug use: No  . Sexual activity: Not on file   Other Topics Concern  . Not on file   Social History Narrative   Exercise:  Yoga, doing some walking      Two children    Family History  Problem Relation Age of Onset  . Melanoma Father   . Hyperlipidemia Mother   . Heart disease Mother     no MI  . Colon cancer Neg Hx   . Stomach cancer Neg Hx   . Esophageal cancer Neg Hx   . Rectal cancer Neg Hx   . Stroke Neg Hx   . Diabetes Paternal Grandmother     vision loss  . Diabetes Maternal Uncle     Review of Systems  Constitutional: Negative for chills and fever.  HENT: Negative for congestion, ear pain, postnasal drip, sinus pain, sinus pressure and sore throat.   Respiratory: Positive for cough. Negative for chest tightness, shortness of breath and wheezing.   Cardiovascular: Negative for chest pain.  Gastrointestinal:       GERD - maybe once a week  Neurological: Negative for headaches.  Objective:   Vitals:   07/02/16 0945  BP: 136/88  Pulse: 77  Resp: 16  Temp: 98.2 F (36.8 C)   Filed Weights   07/02/16 0945  Weight: 138 lb (62.6 kg)   Body mass index is 22.96 kg/m.  Wt Readings from Last 3 Encounters:  07/02/16 138 lb (62.6 kg)  04/05/16 134 lb 1.9 oz (60.8 kg)  03/26/16 137 lb 11.2 oz (62.5 kg)     Physical Exam GENERAL APPEARANCE: Appears stated age, well appearing, NAD EYES: conjunctiva clear, no icterus HEENT: bilateral tympanic membranes and ear canals normal, oropharynx with no erythema, no thyromegaly, trachea midline, no cervical or supraclavicular lymphadenopathy LUNGS: Clear to auscultation without wheeze or crackles, unlabored breathing, good air entry bilaterally HEART: Normal S1,S2 without murmurs EXTREMITIES: Without clubbing, cyanosis, or edema        Assessment & Plan:   See Problem  List for Assessment and Plan of chronic medical problems.

## 2016-07-02 NOTE — Patient Instructions (Addendum)
Start taking zantac at night for at least 2 weeks.  You may need to take it longer to see if the cough improves.    There is no evidence of an infection.  If there is no improvement have the chest xray done and let me know.  We can consider you seeing pulmonary (lung doctors).

## 2016-07-02 NOTE — Progress Notes (Signed)
Pre visit review using our clinic review tool, if applicable. No additional management support is needed unless otherwise documented below in the visit note. 

## 2016-07-02 NOTE — Assessment & Plan Note (Signed)
Exam normal, no evidence of infection CXR ordered FENO 26 - possible asthma - can consider trial of inhaler Has GERD and that may be causing cough as well - start zantac at bedtime for 2--4 weeks to see if there is improvement Will refer to pulmonary if needed

## 2016-08-27 ENCOUNTER — Encounter: Payer: Self-pay | Admitting: Internal Medicine

## 2016-08-27 ENCOUNTER — Ambulatory Visit (INDEPENDENT_AMBULATORY_CARE_PROVIDER_SITE_OTHER): Payer: BLUE CROSS/BLUE SHIELD | Admitting: Internal Medicine

## 2016-08-27 VITALS — BP 138/88 | HR 76 | Ht 64.0 in | Wt 135.0 lb

## 2016-08-27 DIAGNOSIS — R05 Cough: Secondary | ICD-10-CM | POA: Diagnosis not present

## 2016-08-27 DIAGNOSIS — M810 Age-related osteoporosis without current pathological fracture: Secondary | ICD-10-CM

## 2016-08-27 DIAGNOSIS — L03115 Cellulitis of right lower limb: Secondary | ICD-10-CM | POA: Insufficient documentation

## 2016-08-27 DIAGNOSIS — E785 Hyperlipidemia, unspecified: Secondary | ICD-10-CM

## 2016-08-27 DIAGNOSIS — R059 Cough, unspecified: Secondary | ICD-10-CM

## 2016-08-27 DIAGNOSIS — E559 Vitamin D deficiency, unspecified: Secondary | ICD-10-CM

## 2016-08-27 HISTORY — DX: Cellulitis of right lower limb: L03.115

## 2016-08-27 MED ORDER — DOXYCYCLINE HYCLATE 100 MG PO TABS: 100.0000 mg | ORAL_TABLET | Freq: Two times a day (BID) | ORAL | 0 refills | Status: DC

## 2016-08-27 MED ORDER — ASPIRIN EC 81 MG PO TBEC: 81.0000 mg | DELAYED_RELEASE_TABLET | Freq: Every day | ORAL | 3 refills | Status: DC

## 2016-08-27 NOTE — Patient Instructions (Addendum)
Please start the Aspirin 81 mg per day to help reduce future risk of heart disease and stroke  Please take all new medication as prescribed - the antibiotic, though you may wnt to give it 1-2 days to see if cont's to improve on its own  Please continue all other medications as before, including trying the symbicort  Please have the pharmacy call with any other refills you may need.  Please keep your appointments with your specialists as you may have planned  Please go to the XRAY Department in the Basement (go straight as you get off the elevator) for the x-ray testing  Please go to the LAB in the Basement (turn left off the elevator) for the tests to be done at your convenience  You will be contacted by phone if any changes need to be made immediately.  Otherwise, you will receive a letter about your results with an explanation, but please check with MyChart first.  Please remember to sign up for MyChart if you have not done so, as this will be important to you in the future with finding out test results, communicating by private email, and scheduling acute appointments online when needed.

## 2016-08-28 ENCOUNTER — Ambulatory Visit (INDEPENDENT_AMBULATORY_CARE_PROVIDER_SITE_OTHER)
Admission: RE | Admit: 2016-08-28 | Discharge: 2016-08-28 | Disposition: A | Discharge status: Home / Self Care | Payer: BLUE CROSS/BLUE SHIELD | Source: Ambulatory Visit | Attending: Internal Medicine | Admitting: Internal Medicine

## 2016-08-28 ENCOUNTER — Other Ambulatory Visit: Payer: Self-pay | Admitting: Internal Medicine

## 2016-08-28 ENCOUNTER — Other Ambulatory Visit (INDEPENDENT_AMBULATORY_CARE_PROVIDER_SITE_OTHER): Payer: BLUE CROSS/BLUE SHIELD

## 2016-08-28 DIAGNOSIS — E559 Vitamin D deficiency, unspecified: Secondary | ICD-10-CM | POA: Diagnosis not present

## 2016-08-28 DIAGNOSIS — R05 Cough: Secondary | ICD-10-CM | POA: Diagnosis not present

## 2016-08-28 DIAGNOSIS — M810 Age-related osteoporosis without current pathological fracture: Secondary | ICD-10-CM

## 2016-08-28 DIAGNOSIS — R059 Cough, unspecified: Secondary | ICD-10-CM

## 2016-08-28 DIAGNOSIS — R9389 Abnormal findings on diagnostic imaging of other specified body structures: Secondary | ICD-10-CM

## 2016-08-28 LAB — VITAMIN D 25 HYDROXY (VIT D DEFICIENCY, FRACTURES): VITD: 38.67 ng/mL (ref 30.00–100.00)

## 2016-08-28 NOTE — Assessment & Plan Note (Signed)
Ok for PTH level though admittedly low likelihood abnormal

## 2016-08-28 NOTE — Assessment & Plan Note (Signed)
?   Mild vs inflammatory with natural healing;  Will give rx doxycycline but suggested she only take if her exam worsens or she gets fever, chills

## 2016-08-28 NOTE — Assessment & Plan Note (Signed)
Ok for vit d level - to be done when lab reopens/now closed

## 2016-08-28 NOTE — Progress Notes (Signed)
Subjective:    Patient ID: Linda [name], female    DOB: [birth year], 66 y.o.   MRN: [id number]  HPI Here 10 days after occurrence of injury to right mid pretibial leg with what started like a superfical abrasion/laceration after struck the leg on a hard surface by accident.  Has been trying peroxide, and neosporin once, and bleeding stopped within a few hours, but still seems quite large scab to her and redness/swelling ? Mild increased in last few days.  She was concerned it might be getting worse.  No other leg swelling, red streaks, or drainage.  Pain may be slightly worse.  No fever overall but has occasionally felt warmish. No chills.  Although I am not her PCP, she has much difficulty getting here, and asks me for f/u Vit D level as has been low in the past.  She is wary of taking any medication overall but is taking the fosamax, at a total of still less than 5 yrs.  No hx of hypercalcemia.  Not taking asa 81 bc she states no one asked her to do this, though her husband takes every day.  Pt denies chest pain, increased sob or doe, wheezing, orthopnea, PND, increased LE swelling, palpitations, dizziness or syncope, but has chronic cough for several months atributed in past to nasal allergies post nasal gtt and/or possible reflux.  She asks for cxr   Pt denies fever, wt loss, night sweats, loss of appetite, or other constitutional symptoms Past Medical History:  Diagnosis Date  . Hyperlipidemia   . Osteopenia   . Vitamin D deficiency    Past Surgical History:  Procedure Laterality Date  . COLONOSCOPY  03/2011   Dr Brodie;diminuitive polyp  . G 2 P 2    . TONSILLECTOMY      reports that she has never smoked. She has never used smokeless tobacco. She reports that she drinks about 8.4 oz of alcohol per week . She reports that she does not use drugs. family history includes Diabetes in her maternal uncle and paternal grandmother; Heart disease in her mother; Hyperlipidemia in her mother;  Melanoma in her father. No Known Allergies Current Outpatient Prescriptions on File Prior to Visit  Medication Sig Dispense Refill  . alendronate (FOSAMAX) 70 MG tablet Take 1 tablet (70 mg total) by mouth every 7 (seven) days. Take with a full glass of water on an empty stomach. 4 tablet 11  . Ascorbic Acid (VITAMIN C) 1000 MG tablet Take 1,000 mg by mouth daily.      . budesonide-formoterol (SYMBICORT) 80-4.5 MCG/ACT inhaler Inhale 2 puffs into the lungs 2 (two) times daily. Rinse mouth after use. 1 Inhaler 0  . Calcium Carb-Cholecalciferol (CALCIUM 1000 + D PO) Take by mouth daily.      . cholecalciferol (VITAMIN D) 1000 units tablet Take 1.5 tablets (1,500 Units total) by mouth daily. 90 tablet 2   No current facility-administered medications on file prior to visit.    Review of Systems  Constitutional: Negative for other unusual diaphoresis or sweats HENT: Negative for ear discharge or swelling Eyes: Negative for other worsening visual disturbances Respiratory: Negative for stridor or other swelling  Gastrointestinal: Negative for worsening distension or other blood Genitourinary: Negative for retention or other urinary change Musculoskeletal: Negative for other MSK pain or swelling Skin: Negative for color change or other new lesions Neurological: Negative for worsening tremors and other numbness  Psychiatric/Behavioral: Negative for worsening agitation or other fatigue All other system neg  per pt    Objective:   Physical Exam BP 138/88   Pulse 76   Ht 5\' 4"  (1.626 m)   Wt 135 lb (61.2 kg)   SpO2 97%   BMI 23.17 kg/m  VS noted,  Constitutional: Pt appears in NAD HENT: Head: NCAT.  Right Ear: External ear normal.  Left Ear: External ear normal.  Eyes: . Pupils are equal, round, and reactive to light. Conjunctivae and EOM are normal Nose: without d/c or deformity Neck: Neck supple. Gross normal ROM Cardiovascular: Normal rate and regular rhythm.   Pulmonary/Chest:  Effort normal and breath sounds without rales or wheezing.  Abd:  Soft, NT, ND, + BS, no organomegaly Neurological: Pt is alert. At baseline orientation, motor grossly intact Skin: Skin is warm. No rashes, no LE edema, but right pretibial mid aspect approx 4 cm below the knees area central 1/2 cm scabbing with 2 cm surrounding erythema with mild tender, slight swelling, no fluctuance or drainage, no red streaks ot other leg swelling, calf nontender Psychiatric: Pt behavior is normal without agitation  No other exam findings Lab Results  Component Value Date   WBC 4.8 10/04/2015   HGB 13.5 10/04/2015   HCT 39.1 10/04/2015   PLT 237.0 10/04/2015   GLUCOSE 105 (H) 10/04/2015   CHOL 226 (H) 10/04/2015   TRIG 88.0 10/04/2015   HDL 101.70 10/04/2015   LDLDIRECT 76.4 04/07/2011   LDLCALC 106 (H) 10/04/2015   ALT 10 10/04/2015   AST 15 10/04/2015   NA 139 10/04/2015   K 3.8 10/04/2015   CL 101 10/04/2015   CREATININE 0.67 10/04/2015   BUN 18 10/04/2015   CO2 30 10/04/2015   TSH 1.92 10/04/2015   HGBA1C 5.6 05/23/2014       Assessment & Plan:

## 2016-08-28 NOTE — Assessment & Plan Note (Signed)
D/w pt most recent results, to f/u with PCP, but suggested she start aspirin 81 qd given age and risk factors

## 2016-08-28 NOTE — Assessment & Plan Note (Signed)
Exam benign but with persistent symptoms; for cxr r/o other pulm process

## 2016-08-29 LAB — PTH, INTACT AND CALCIUM
CALCIUM: 9.4 mg/dL (ref 8.6–10.4)
PTH: 60 pg/mL (ref 14–64)

## 2016-09-25 ENCOUNTER — Ambulatory Visit (INDEPENDENT_AMBULATORY_CARE_PROVIDER_SITE_OTHER)
Admission: RE | Admit: 2016-09-25 | Discharge: 2016-09-25 | Disposition: A | Discharge status: Home / Self Care | Payer: BLUE CROSS/BLUE SHIELD | Source: Ambulatory Visit | Attending: Internal Medicine | Admitting: Internal Medicine

## 2016-09-25 DIAGNOSIS — R938 Abnormal findings on diagnostic imaging of other specified body structures: Secondary | ICD-10-CM | POA: Diagnosis not present

## 2016-09-25 DIAGNOSIS — R9389 Abnormal findings on diagnostic imaging of other specified body structures: Secondary | ICD-10-CM

## 2016-12-25 ENCOUNTER — Telehealth: Payer: Self-pay | Admitting: Internal Medicine

## 2016-12-25 NOTE — Telephone Encounter (Signed)
Patient states that she had her flu shot at [facility] on [address] at the [employer].

## 2016-12-26 NOTE — Telephone Encounter (Signed)
Flu Shot is documented.

## 2017-01-25 ENCOUNTER — Encounter: Payer: Self-pay | Admitting: Internal Medicine

## 2017-01-25 NOTE — Patient Instructions (Addendum)
Test(s) ordered today. Your results will be released to [facility] (or called to you) after review, usually within 72hours after test completion. If any changes need to be made, you will be notified at that same time.  All other Health Maintenance issues reviewed.   All recommended immunizations and age-appropriate screenings are up-to-date or discussed.  No immunizations administered today.   Medications reviewed and updated.  No changes recommended at this time.   Please followup in one year   Health Maintenance, Female Adopting a healthy lifestyle and getting preventive care can go a long way to promote health and wellness. Talk with your health care provider about what schedule of regular examinations is right for you. This is a good chance for you to check in with your provider about disease prevention and staying healthy. In between checkups, there are plenty of things you can do on your own. Experts have done a lot of research about which lifestyle changes and preventive measures are most likely to keep you healthy. Ask your health care provider for more information. Weight and diet Eat a healthy diet  Be sure to include plenty of vegetables, fruits, low-fat dairy products, and lean protein.  Do not eat a lot of foods high in solid fats, added sugars, or salt.  Get regular exercise. This is one of the most important things you can do for your health. ? Most adults should exercise for at least 150 minutes each week. The exercise should increase your heart rate and make you sweat (moderate-intensity exercise). ? Most adults should also do strengthening exercises at least twice a week. This is in addition to the moderate-intensity exercise.  Maintain a healthy weight  Body mass index (BMI) is a measurement that can be used to identify possible weight problems. It estimates body fat based on height and weight. Your health care provider can help determine your BMI and help you achieve or  maintain a healthy weight.  For females 64 years of age and older: ? A BMI below 18.5 is considered underweight. ? A BMI of 18.5 to 24.9 is normal. ? A BMI of 25 to 29.9 is considered overweight. ? A BMI of 30 and above is considered obese.  Watch levels of cholesterol and blood lipids  You should start having your blood tested for lipids and cholesterol at 66 years of age, then have this test every 5 years.  You may need to have your cholesterol levels checked more often if: ? Your lipid or cholesterol levels are high. ? You are older than 66 years of age. ? You are at high risk for heart disease.  Cancer screening Lung Cancer  Lung cancer screening is recommended for adults 72-7 years old who are at high risk for lung cancer because of a history of smoking.  A yearly low-dose CT scan of the lungs is recommended for people who: ? Currently smoke. ? Have quit within the past 15 years. ? Have at least a 30-pack-year history of smoking. A pack year is smoking an average of one pack of cigarettes a day for 1 year.  Yearly screening should continue until it has been 15 years since you quit.  Yearly screening should stop if you develop a health problem that would prevent you from having lung cancer treatment.  Breast Cancer  Practice breast self-awareness. This means understanding how your breasts normally appear and feel.  It also means doing regular breast self-exams. Let your health care provider know about any changes,  no matter how small.  If you are in your 20s or 30s, you should have a clinical breast exam (CBE) by a health care provider every 1-3 years as part of a regular health exam.  If you are 40 or older, have a CBE every year. Also consider having a breast X-ray (mammogram) every year.  If you have a family history of breast cancer, talk to your health care provider about genetic screening.  If you are at high risk for breast cancer, talk to your health care  provider about having an MRI and a mammogram every year.  Breast cancer gene (BRCA) assessment is recommended for women who have family members with BRCA-related cancers. BRCA-related cancers include: ? Breast. ? Ovarian. ? Tubal. ? Peritoneal cancers.  Results of the assessment will determine the need for genetic counseling and BRCA1 and BRCA2 testing.  Cervical Cancer Your health care provider may recommend that you be screened regularly for cancer of the pelvic organs (ovaries, uterus, and vagina). This screening involves a pelvic examination, including checking for microscopic changes to the surface of your cervix (Pap test). You may be encouraged to have this screening done every 3 years, beginning at age 21.  For women ages 30-65, health care providers may recommend pelvic exams and Pap testing every 3 years, or they may recommend the Pap and pelvic exam, combined with testing for human papilloma virus (HPV), every 5 years. Some types of HPV increase your risk of cervical cancer. Testing for HPV may also be done on women of any age with unclear Pap test results.  Other health care providers may not recommend any screening for nonpregnant women who are considered low risk for pelvic cancer and who do not have symptoms. Ask your health care provider if a screening pelvic exam is right for you.  If you have had past treatment for cervical cancer or a condition that could lead to cancer, you need Pap tests and screening for cancer for at least 20 years after your treatment. If Pap tests have been discontinued, your risk factors (such as having a new sexual partner) need to be reassessed to determine if screening should resume. Some women have medical problems that increase the chance of getting cervical cancer. In these cases, your health care provider may recommend more frequent screening and Pap tests.  Colorectal Cancer  This type of cancer can be detected and often prevented.  Routine  colorectal cancer screening usually begins at 66 years of age and continues through 66 years of age.  Your health care provider may recommend screening at an earlier age if you have risk factors for colon cancer.  Your health care provider may also recommend using home test kits to check for hidden blood in the stool.  A small camera at the end of a tube can be used to examine your colon directly (sigmoidoscopy or colonoscopy). This is done to check for the earliest forms of colorectal cancer.  Routine screening usually begins at age 50.  Direct examination of the colon should be repeated every 5-10 years through 66 years of age. However, you may need to be screened more often if early forms of precancerous polyps or small growths are found.  Skin Cancer  Check your skin from head to toe regularly.  Tell your health care provider about any new moles or changes in moles, especially if there is a change in a mole's shape or color.  Also tell your health care provider if you   have a mole that is larger than the size of a pencil eraser.  Always use sunscreen. Apply sunscreen liberally and repeatedly throughout the day.  Protect yourself by wearing long sleeves, pants, a wide-brimmed hat, and sunglasses whenever you are outside.  Heart disease, diabetes, and high blood pressure  High blood pressure causes heart disease and increases the risk of stroke. High blood pressure is more likely to develop in: ? People who have blood pressure in the high end of the normal range (130-139/85-89 mm Hg). ? People who are overweight or obese. ? People who are African American.  If you are 24-25 years of age, have your blood pressure checked every 3-5 years. If you are 2 years of age or older, have your blood pressure checked every year. You should have your blood pressure measured twice-once when you are at a hospital or clinic, and once when you are not at a hospital or clinic. Record the average of the  two measurements. To check your blood pressure when you are not at a hospital or clinic, you can use: ? An automated blood pressure machine at a pharmacy. ? A home blood pressure monitor.  If you are between 42 years and 59 years old, ask your health care provider if you should take aspirin to prevent strokes.  Have regular diabetes screenings. This involves taking a blood sample to check your fasting blood sugar level. ? If you are at a normal weight and have a low risk for diabetes, have this test once every three years after 66 years of age. ? If you are overweight and have a high risk for diabetes, consider being tested at a younger age or more often. Preventing infection Hepatitis B  If you have a higher risk for hepatitis B, you should be screened for this virus. You are considered at high risk for hepatitis B if: ? You were born in a country where hepatitis B is common. Ask your health care provider which countries are considered high risk. ? Your parents were born in a high-risk country, and you have not been immunized against hepatitis B (hepatitis B vaccine). ? You have HIV or AIDS. ? You use needles to inject street drugs. ? You live with someone who has hepatitis B. ? You have had sex with someone who has hepatitis B. ? You get hemodialysis treatment. ? You take certain medicines for conditions, including cancer, organ transplantation, and autoimmune conditions.  Hepatitis C  Blood testing is recommended for: ? Everyone born from [birth year] through 1965. ? Anyone with known risk factors for hepatitis C.  Sexually transmitted infections (STIs)  You should be screened for sexually transmitted infections (STIs) including gonorrhea and chlamydia if: ? You are sexually active and are younger than 66 years of age. ? You are older than 66 years of age and your health care provider tells you that you are at risk for this type of infection. ? Your sexual activity has changed since you  were last screened and you are at an increased risk for chlamydia or gonorrhea. Ask your health care provider if you are at risk.  If you do not have HIV, but are at risk, it may be recommended that you take a prescription medicine daily to prevent HIV infection. This is called pre-exposure prophylaxis (PrEP). You are considered at risk if: ? You are sexually active and do not regularly use condoms or know the HIV status of your partner(s). ? You take drugs by injection. ?  You are sexually active with a partner who has HIV.  Talk with your health care provider about whether you are at high risk of being infected with HIV. If you choose to begin PrEP, you should first be tested for HIV. You should then be tested every 3 months for as long as you are taking PrEP. Pregnancy  If you are premenopausal and you may become pregnant, ask your health care provider about preconception counseling.  If you may become pregnant, take 400 to 800 micrograms (mcg) of folic acid every day.  If you want to prevent pregnancy, talk to your health care provider about birth control (contraception). Osteoporosis and menopause  Osteoporosis is a disease in which the bones lose minerals and strength with aging. This can result in serious bone fractures. Your risk for osteoporosis can be identified using a bone density scan.  If you are 65 years of age or older, or if you are at risk for osteoporosis and fractures, ask your health care provider if you should be screened.  Ask your health care provider whether you should take a calcium or vitamin D supplement to lower your risk for osteoporosis.  Menopause may have certain physical symptoms and risks.  Hormone replacement therapy may reduce some of these symptoms and risks. Talk to your health care provider about whether hormone replacement therapy is right for you. Follow these instructions at home:  Schedule regular health, dental, and eye exams.  Stay current  with your immunizations.  Do not use any tobacco products including cigarettes, chewing tobacco, or electronic cigarettes.  If you are pregnant, do not drink alcohol.  If you are breastfeeding, limit how much and how often you drink alcohol.  Limit alcohol intake to no more than 1 drink per day for nonpregnant women. One drink equals 12 ounces of beer, 5 ounces of wine, or 1 ounces of hard liquor.  Do not use street drugs.  Do not share needles.  Ask your health care provider for help if you need support or information about quitting drugs.  Tell your health care provider if you often feel depressed.  Tell your health care provider if you have ever been abused or do not feel safe at home. This information is not intended to replace advice given to you by your health care provider. Make sure you discuss any questions you have with your health care provider. Document Released: 09/23/2010 Document Revised: 08/16/2015 Document Reviewed: 12/12/2014 Elsevier Interactive Patient Education  2018 [employer] Inc[employer]  

## 2017-01-25 NOTE — Progress Notes (Signed)
Subjective:    Patient ID: Linda [name], female    DOB: 1950-05-07, 66 y.o.   MRN: [id number]  HPI She is here for a physical exam.   She denies changes in her health.  She is still working in [employer].    Medications and allergies reviewed with patient and updated if appropriate.  Patient Active Problem List   Diagnosis Date Noted  . Cellulitis of leg, right 08/27/2016  . Cough 07/02/2016  . History of colonic polyps 05/22/2014  . Hyperglycemia 09/04/2013  . Allergic rhinitis 08/06/2013  . ANXIETY STATE, UNSPECIFIED 11/29/2009  . Vitamin D deficiency 04/10/2008  . Hyperlipidemia 10/04/2007  . Osteoporosis 10/04/2007    Current Outpatient Medications on File Prior to Visit  Medication Sig Dispense Refill  . alendronate (FOSAMAX) 70 MG tablet Take 1 tablet (70 mg total) by mouth every 7 (seven) days. Take with a full glass of water on an empty stomach. 4 tablet 11  . Ascorbic Acid (VITAMIN C) 1000 MG tablet Take 1,000 mg by mouth daily.      Marland Kitchen aspirin EC 81 MG tablet Take 1 tablet (81 mg total) by mouth daily. 90 tablet 3  . budesonide-formoterol (SYMBICORT) 80-4.5 MCG/ACT inhaler Inhale 2 puffs into the lungs 2 (two) times daily. Rinse mouth after use. 1 Inhaler 0  . Calcium Carb-Cholecalciferol (CALCIUM 1000 + D PO) Take by mouth daily.      . cholecalciferol (VITAMIN D) 1000 units tablet Take 1.5 tablets (1,500 Units total) by mouth daily. 90 tablet 2   No current facility-administered medications on file prior to visit.     Past Medical History:  Diagnosis Date  . Hyperlipidemia   . Osteopenia   . Vitamin D deficiency     Past Surgical History:  Procedure Laterality Date  . COLONOSCOPY  03/2011   Dr Brodie;diminuitive polyp  . G 2 P 2    . TONSILLECTOMY      Social History   Socioeconomic History  . Marital status: Married    Spouse name: None  . Number of children: 2  . Years of education: None  . Highest education level: None  Social Needs  .  Financial resource strain: None  . Food insecurity - worry: None  . Food insecurity - inability: None  . Transportation needs - medical: None  . Transportation needs - non-medical: None  Occupational History  . Occupation: banking  Tobacco Use  . Smoking status: Never Smoker  . Smokeless tobacco: Never Used  Substance and Sexual Activity  . Alcohol use: Yes    Alcohol/week: 8.4 oz    Types: 14 Glasses of wine per week    Comment: wine nightly  . Drug use: No  . Sexual activity: None  Other Topics Concern  . None  Social History Narrative   Exercise:  Yoga, doing some walking      Two children    Family History  Problem Relation Age of Onset  . Melanoma Father   . Hyperlipidemia Mother   . Heart disease Mother        no MI  . Diabetes Paternal Grandmother        vision loss  . Diabetes Maternal Uncle   . Colon cancer Neg Hx   . Stomach cancer Neg Hx   . Esophageal cancer Neg Hx   . Rectal cancer Neg Hx   . Stroke Neg Hx     Review of Systems  Constitutional: Negative for appetite change,  chills, fatigue and fever.  Eyes: Negative for visual disturbance.  Respiratory: Negative for cough, shortness of breath and wheezing.   Cardiovascular: Negative for chest pain, palpitations and leg swelling.  Gastrointestinal: Negative for abdominal pain, blood in stool, constipation, diarrhea and nausea.       No gerd  Genitourinary: Negative for difficulty urinating, dysuria and hematuria.  Musculoskeletal: Negative for arthralgias and back pain.  Skin: Negative for color change and rash.  Neurological: Negative for light-headedness and headaches.  Psychiatric/Behavioral: Negative for dysphoric mood. The patient is nervous/anxious.        Objective:   Vitals:   01/27/17 0810  BP: 126/82  Pulse: 72  Resp: 16  Temp: 99 F (37.2 C)  SpO2: 97%   Filed Weights   01/27/17 0810  Weight: 136 lb (61.7 kg)   Body mass index is 23.34 kg/m.  Wt Readings from Last 3  Encounters:  01/27/17 136 lb (61.7 kg)  08/27/16 135 lb (61.2 kg)  07/02/16 138 lb (62.6 kg)     Physical Exam Constitutional: She appears well-developed and well-nourished. No distress.  HENT:  Head: Normocephalic and atraumatic.  Right Ear: External ear normal. Normal ear canal and TM Left Ear: External ear normal.  Normal ear canal and TM Mouth/Throat: Oropharynx is clear and moist.  Eyes: Conjunctivae and EOM are normal.  Neck: Neck supple. No tracheal deviation present. No thyromegaly present.  No carotid bruit  Cardiovascular: Normal rate, regular rhythm and normal heart sounds.   No murmur heard.  No edema. Pulmonary/Chest: Effort normal and breath sounds normal. No respiratory distress. She has no wheezes. She has no rales.  Breast: deferred to Gyn Abdominal: Soft. She exhibits no distension. There is no tenderness.  Lymphadenopathy: She has no cervical adenopathy.  Skin: Skin is warm and dry. She is not diaphoretic.  Psychiatric: She has a normal mood and affect. Her behavior is normal.        Assessment & Plan:   Physical exam: Screening blood work   ordered Immunizations  Pneumovax today, discussed shingrix - needs to wait Colonoscopy  Up to date  Mammogram   Up to date  Gyn   Up to date  Dexa  - due 04/2017 Eye exams  Up to date  EKG  Last done 2013, but not indicated Exercise  Regular - yoga, weight, walking Weight  Normal BMI Skin   No concerns, sees derm Substance abuse - drinks wine nightly - advised to decrease - no more than 1 glass/night  See Problem List for Assessment and Plan of chronic medical problems.

## 2017-01-27 ENCOUNTER — Other Ambulatory Visit (INDEPENDENT_AMBULATORY_CARE_PROVIDER_SITE_OTHER): Payer: BLUE CROSS/BLUE SHIELD

## 2017-01-27 ENCOUNTER — Encounter: Payer: BLUE CROSS/BLUE SHIELD | Admitting: Internal Medicine

## 2017-01-27 ENCOUNTER — Encounter: Payer: Self-pay | Admitting: Internal Medicine

## 2017-01-27 ENCOUNTER — Ambulatory Visit (INDEPENDENT_AMBULATORY_CARE_PROVIDER_SITE_OTHER): Payer: BLUE CROSS/BLUE SHIELD | Admitting: Internal Medicine

## 2017-01-27 VITALS — BP 126/82 | HR 72 | Temp 99.0°F | Resp 16 | Ht 64.0 in | Wt 136.0 lb

## 2017-01-27 DIAGNOSIS — M810 Age-related osteoporosis without current pathological fracture: Secondary | ICD-10-CM | POA: Diagnosis not present

## 2017-01-27 DIAGNOSIS — Z0000 Encounter for general adult medical examination without abnormal findings: Secondary | ICD-10-CM | POA: Diagnosis not present

## 2017-01-27 DIAGNOSIS — Z23 Encounter for immunization: Secondary | ICD-10-CM | POA: Diagnosis not present

## 2017-01-27 DIAGNOSIS — R739 Hyperglycemia, unspecified: Secondary | ICD-10-CM

## 2017-01-27 LAB — LIPID PANEL
CHOLESTEROL: 201 mg/dL — AB (ref 0–200)
HDL: 99.1 mg/dL (ref >39.00)
LDL Cholesterol: 90 mg/dL (ref 0–99)
NonHDL: 101.46
TRIGLYCERIDES: 59 mg/dL (ref 0.0–149.0)
Total CHOL/HDL Ratio: 2
VLDL: 11.8 mg/dL (ref 0.0–40.0)

## 2017-01-27 LAB — COMPREHENSIVE METABOLIC PANEL
ALBUMIN: 4.5 g/dL (ref 3.5–5.2)
ALK PHOS: 42 U/L (ref 39–117)
ALT: 10 U/L (ref 0–35)
AST: 13 U/L (ref 0–37)
BILIRUBIN TOTAL: 0.8 mg/dL (ref 0.2–1.2)
BUN: 18 mg/dL (ref 6–23)
CALCIUM: 9.9 mg/dL (ref 8.4–10.5)
CO2: 31 mEq/L (ref 19–32)
Chloride: 101 mEq/L (ref 96–112)
Creatinine, Ser: 0.56 mg/dL (ref 0.40–1.20)
GFR: 114.95 mL/min (ref >60.00)
Glucose, Bld: 115 mg/dL — ABNORMAL HIGH (ref 70–99)
Potassium: 4 mEq/L (ref 3.5–5.1)
Sodium: 139 mEq/L (ref 135–145)
TOTAL PROTEIN: 7.3 g/dL (ref 6.0–8.3)

## 2017-01-27 LAB — CBC WITH DIFFERENTIAL/PLATELET
BASOS ABS: 0 10*3/uL (ref 0.0–0.1)
Basophils Relative: 0.7 % (ref 0.0–3.0)
EOS ABS: 0.1 10*3/uL (ref 0.0–0.7)
Eosinophils Relative: 2.3 % (ref 0.0–5.0)
HEMATOCRIT: 40.4 % (ref 36.0–46.0)
Hemoglobin: 13.7 g/dL (ref 12.0–15.0)
LYMPHS PCT: 30.1 % (ref 12.0–46.0)
Lymphs Abs: 1.1 10*3/uL (ref 0.7–4.0)
MCHC: 33.9 g/dL (ref 30.0–36.0)
MCV: 89.2 fl (ref 78.0–100.0)
MONOS PCT: 8.1 % (ref 3.0–12.0)
Monocytes Absolute: 0.3 10*3/uL (ref 0.1–1.0)
Neutro Abs: 2.2 10*3/uL (ref 1.4–7.7)
Neutrophils Relative %: 58.8 % (ref 43.0–77.0)
Platelets: 228 10*3/uL (ref 150.0–400.0)
RBC: 4.54 Mil/uL (ref 3.87–5.11)
RDW: 13.2 % (ref 11.5–15.5)
WBC: 3.8 10*3/uL — AB (ref 4.0–10.5)

## 2017-01-27 LAB — HEMOGLOBIN A1C: HEMOGLOBIN A1C: 5.4 % (ref 4.6–6.5)

## 2017-01-27 LAB — TSH: TSH: 1.56 u[IU]/mL (ref 0.35–4.50)

## 2017-01-27 NOTE — Assessment & Plan Note (Signed)
a1c

## 2017-01-27 NOTE — Assessment & Plan Note (Addendum)
dexa up to date Taking fosamax and no side effects ( started 2017 ? ) Walking and doing weights, yoga Taking calcium and vitamin D

## 2017-01-29 ENCOUNTER — Encounter: Payer: Self-pay | Admitting: Internal Medicine

## 2017-01-30 ENCOUNTER — Encounter: Payer: Self-pay | Admitting: Internal Medicine

## 2017-02-03 ENCOUNTER — Telehealth: Payer: Self-pay | Admitting: Internal Medicine

## 2017-02-03 ENCOUNTER — Encounter: Payer: Self-pay | Admitting: Internal Medicine

## 2017-02-03 NOTE — Telephone Encounter (Signed)
Med list is up to date.  Can you update flu and let her know about biometric screening.  Thanks.

## 2017-02-03 NOTE — Telephone Encounter (Signed)
States on patients last OV she gave a biometric screening form to be completed to Dr. Quay Burow assistant.  States that the form was to be mailed to her.  States she has not received this form yet.  Patient wanted to follow up.

## 2017-02-03 NOTE — Telephone Encounter (Signed)
Spoke with pt to inform paperwork was mailed last week.

## 2017-02-04 NOTE — Telephone Encounter (Signed)
Flu shot has already been documented. And spoke with pt yesterday about screening paperwork.

## 2017-05-12 DIAGNOSIS — M25512 Pain in left shoulder: Secondary | ICD-10-CM | POA: Diagnosis not present

## 2017-05-19 DIAGNOSIS — M25612 Stiffness of left shoulder, not elsewhere classified: Secondary | ICD-10-CM | POA: Diagnosis not present

## 2017-05-19 DIAGNOSIS — M25512 Pain in left shoulder: Secondary | ICD-10-CM | POA: Diagnosis not present

## 2017-05-19 DIAGNOSIS — Z0100 Encounter for examination of eyes and vision without abnormal findings: Secondary | ICD-10-CM | POA: Diagnosis not present

## 2017-05-21 DIAGNOSIS — Z1231 Encounter for screening mammogram for malignant neoplasm of breast: Secondary | ICD-10-CM | POA: Diagnosis not present

## 2017-05-21 DIAGNOSIS — Z01419 Encounter for gynecological examination (general) (routine) without abnormal findings: Secondary | ICD-10-CM | POA: Diagnosis not present

## 2017-05-21 DIAGNOSIS — M25512 Pain in left shoulder: Secondary | ICD-10-CM | POA: Diagnosis not present

## 2017-05-21 DIAGNOSIS — M25612 Stiffness of left shoulder, not elsewhere classified: Secondary | ICD-10-CM | POA: Diagnosis not present

## 2017-05-26 DIAGNOSIS — M810 Age-related osteoporosis without current pathological fracture: Secondary | ICD-10-CM | POA: Diagnosis not present

## 2017-05-26 DIAGNOSIS — M25612 Stiffness of left shoulder, not elsewhere classified: Secondary | ICD-10-CM | POA: Diagnosis not present

## 2017-05-26 DIAGNOSIS — M25512 Pain in left shoulder: Secondary | ICD-10-CM | POA: Diagnosis not present

## 2017-05-28 DIAGNOSIS — M25512 Pain in left shoulder: Secondary | ICD-10-CM | POA: Diagnosis not present

## 2017-05-28 DIAGNOSIS — M25612 Stiffness of left shoulder, not elsewhere classified: Secondary | ICD-10-CM | POA: Diagnosis not present

## 2017-06-01 DIAGNOSIS — M25512 Pain in left shoulder: Secondary | ICD-10-CM | POA: Diagnosis not present

## 2017-06-01 DIAGNOSIS — M25612 Stiffness of left shoulder, not elsewhere classified: Secondary | ICD-10-CM | POA: Diagnosis not present

## 2017-06-11 DIAGNOSIS — M25512 Pain in left shoulder: Secondary | ICD-10-CM | POA: Diagnosis not present

## 2017-06-11 DIAGNOSIS — M25612 Stiffness of left shoulder, not elsewhere classified: Secondary | ICD-10-CM | POA: Diagnosis not present

## 2017-06-15 DIAGNOSIS — M25512 Pain in left shoulder: Secondary | ICD-10-CM | POA: Diagnosis not present

## 2017-06-15 DIAGNOSIS — M25612 Stiffness of left shoulder, not elsewhere classified: Secondary | ICD-10-CM | POA: Diagnosis not present

## 2017-06-17 DIAGNOSIS — M25512 Pain in left shoulder: Secondary | ICD-10-CM | POA: Diagnosis not present

## 2017-06-17 DIAGNOSIS — M25612 Stiffness of left shoulder, not elsewhere classified: Secondary | ICD-10-CM | POA: Diagnosis not present

## 2017-06-19 DIAGNOSIS — M25612 Stiffness of left shoulder, not elsewhere classified: Secondary | ICD-10-CM | POA: Diagnosis not present

## 2017-06-19 DIAGNOSIS — M25512 Pain in left shoulder: Secondary | ICD-10-CM | POA: Diagnosis not present

## 2017-06-22 ENCOUNTER — Encounter: Payer: Self-pay | Admitting: Family Medicine

## 2017-06-22 ENCOUNTER — Ambulatory Visit (INDEPENDENT_AMBULATORY_CARE_PROVIDER_SITE_OTHER): Payer: Medicare HMO | Admitting: Family Medicine

## 2017-06-22 VITALS — BP 136/80 | HR 71 | Temp 98.8°F | Wt 142.0 lb

## 2017-06-22 DIAGNOSIS — M25612 Stiffness of left shoulder, not elsewhere classified: Secondary | ICD-10-CM | POA: Diagnosis not present

## 2017-06-22 DIAGNOSIS — M25512 Pain in left shoulder: Secondary | ICD-10-CM | POA: Diagnosis not present

## 2017-06-22 DIAGNOSIS — H1033 Unspecified acute conjunctivitis, bilateral: Secondary | ICD-10-CM | POA: Diagnosis not present

## 2017-06-22 NOTE — Progress Notes (Signed)
Subjective:    Patient ID: Linda [name], female    DOB: 06/01/1950, 67 y.o.   MRN: [id number]  Chief Complaint  Patient presents with  . Eye Pain    HPI Patient was seen today for eye infection.  Pt endorses red, irritated eyes with crusting x 1 day.  Pt also endorses some swelling below her eyes.  Pt thinks she may have left her contacts in longer than the 2 wks she is suppose wear them.   Pt states she had some leftover erythromycin eye ointment she started using.  Pt denies blurred vision, eye pain, or recent illness.  Pt does state her grandchild was sick over the wknd.  Pt asked if a lesion on her L forearm that appeared a few days ago was anything she needed to have removed.  Past Medical History:  Diagnosis Date  . Cellulitis of leg, right 08/27/2016  . Hyperlipidemia   . Osteopenia   . Vitamin D deficiency     No Known Allergies  ROS General: Denies fever, chills, night sweats, changes in weight, changes in appetite HEENT: Denies headaches, ear pain, changes in vision, rhinorrhea, sore throat   +red eyes with crusting CV: Denies CP, palpitations, SOB, orthopnea Pulm: Denies SOB, cough, wheezing GI: Denies abdominal pain, nausea, vomiting, diarrhea, constipation GU: Denies dysuria, hematuria, frequency, vaginal discharge Msk: Denies muscle cramps, joint pains Neuro: Denies weakness, numbness, tingling Skin: Denies rashes, bruising  + lesion on left forearm Psych: Denies depression, anxiety, hallucinations     Objective:    Blood pressure 136/80, pulse 71, temperature 98.8 F (37.1 C), temperature source Oral, weight 142 lb (64.4 kg), SpO2 98 %.   Gen. Pleasant, well-nourished, in no distress, normal affect   HEENT: Wearing glasses, [place]/AT, face symmetric, mild conjunctival injection, no scleral icterus, PERRLA, EOMI, unable to fully appreciated any crusting or purulent drainage as pt just placed erythromycin ointment in eyes.  Nares patent without drainage. Lungs: no  accessory muscle use, CTAB cardiovascular: RRR, no peripheral edema Neuro:  A&Ox3, CN II-XII intact, normal gait Skin:  Warm, no lesions/ rash.  8 mm area of ecchymosis on left forearm   Wt Readings from Last 3 Encounters:  06/22/17 142 lb (64.4 kg)  01/27/17 136 lb (61.7 kg)  08/27/16 135 lb (61.2 kg)    Lab Results  Component Value Date   WBC 3.8 (L) 01/27/2017   HGB 13.7 01/27/2017   HCT 40.4 01/27/2017   PLT 228.0 01/27/2017   GLUCOSE 115 (H) 01/27/2017   CHOL 201 (H) 01/27/2017   TRIG 59.0 01/27/2017   HDL 99.10 01/27/2017   LDLDIRECT 76.4 04/07/2011   LDLCALC 90 01/27/2017   ALT 10 01/27/2017   AST 13 01/27/2017   NA 139 01/27/2017   K 4.0 01/27/2017   CL 101 01/27/2017   CREATININE 0.56 01/27/2017   BUN 18 01/27/2017   CO2 31 01/27/2017   TSH 1.56 01/27/2017   HGBA1C 5.4 01/27/2017    Assessment/Plan:  Acute bacterial conjunctivitis of both eyes -Continue erythromycin ointment daily -Discussed frequent handwashing -Discussed using warm compresses on eyes a few times a day -Patient given RTC or ED precautions -Follow-up with PCP in the next few days if symptoms become worse or do not improve  Small area of ecchymosis on left forearm.  Patient reassured.  Will continue to monitor.  Grier Mitts, MD

## 2017-06-24 DIAGNOSIS — M25612 Stiffness of left shoulder, not elsewhere classified: Secondary | ICD-10-CM | POA: Diagnosis not present

## 2017-06-24 DIAGNOSIS — M25512 Pain in left shoulder: Secondary | ICD-10-CM | POA: Diagnosis not present

## 2017-06-26 DIAGNOSIS — M25612 Stiffness of left shoulder, not elsewhere classified: Secondary | ICD-10-CM | POA: Diagnosis not present

## 2017-06-26 DIAGNOSIS — M25512 Pain in left shoulder: Secondary | ICD-10-CM | POA: Diagnosis not present

## 2017-06-29 DIAGNOSIS — M25512 Pain in left shoulder: Secondary | ICD-10-CM | POA: Diagnosis not present

## 2017-06-29 DIAGNOSIS — M25612 Stiffness of left shoulder, not elsewhere classified: Secondary | ICD-10-CM | POA: Diagnosis not present

## 2017-07-01 NOTE — Progress Notes (Signed)
Subjective:    Patient ID: Linda [name], female    DOB: 07/08/1950, 67 y.o.   MRN: [id number]  HPI The patient is here for an acute visit.  She is here for an acute visit for cold symptoms.  Her symptoms started over two weeks ago.  She is around her 64-month old granddaughter a lot and thinks she probably got from her.  She is experiencing subjective fevers, fatigue, nasal congestion, sinus pain and pressure, cough, some body aches and headaches.  She denies any ear pain, sore throat, shortness of breath, wheezing and nausea.  She has tried taking decongestant a couple of times.   GERD, Sharp pain in chest: For the past 3 months she has been experiencing increased GERD.  She thinks it started when she was taking Fosamax.  She is no longer taking that.  She has taken some over-the-counter medication but nothing consistently.  She feels the reflux intermittently throughout the day.  The other night she had one episode of severe stabbing pain in her chest and thinks it was related to her stomach.  She has not had a recurrence of that.    Medications and allergies reviewed with patient and updated if appropriate.  Patient Active Problem List   Diagnosis Date Noted  . History of colonic polyps 05/22/2014  . Hyperglycemia 09/04/2013  . Allergic rhinitis 08/06/2013  . ANXIETY STATE, UNSPECIFIED 11/29/2009  . Vitamin D deficiency 04/10/2008  . Hyperlipidemia 10/04/2007  . Osteoporosis 10/04/2007    Current Outpatient Medications on File Prior to Visit  Medication Sig Dispense Refill  . Ascorbic Acid (VITAMIN C) 1000 MG tablet Take 1,000 mg by mouth daily.      . Calcium Carb-Cholecalciferol (CALCIUM 1000 + D PO) Take by mouth daily.      . cholecalciferol (VITAMIN D) 1000 units tablet Take 1.5 tablets (1,500 Units total) by mouth daily. 90 tablet 2   No current facility-administered medications on file prior to visit.     Past Medical History:  Diagnosis Date  . Cellulitis of  leg, right 08/27/2016  . Hyperlipidemia   . Osteopenia   . Vitamin D deficiency     Past Surgical History:  Procedure Laterality Date  . COLONOSCOPY  03/2011   Dr Brodie;diminuitive polyp  . G 2 P 2    . TONSILLECTOMY      Social History   Socioeconomic History  . Marital status: Married    Spouse name: Not on file  . Number of children: 2  . Years of education: Not on file  . Highest education level: Not on file  Occupational History  . Occupation: Science writer  Social Needs  . Financial resource strain: Not on file  . Food insecurity:    Worry: Not on file    Inability: Not on file  . Transportation needs:    Medical: Not on file    Non-medical: Not on file  Tobacco Use  . Smoking status: Never Smoker  . Smokeless tobacco: Never Used  Substance and Sexual Activity  . Alcohol use: Yes    Alcohol/week: 8.4 oz    Types: 14 Glasses of wine per week    Comment: wine nightly  . Drug use: No  . Sexual activity: Not on file  Lifestyle  . Physical activity:    Days per week: Not on file    Minutes per session: Not on file  . Stress: Not on file  Relationships  . Social connections:  Talks on phone: Not on file    Gets together: Not on file    Attends religious service: Not on file    Active member of club or organization: Not on file    Attends meetings of clubs or organizations: Not on file    Relationship status: Not on file  Other Topics Concern  . Not on file  Social History Narrative   Exercise:  Yoga, weights, walking      Two children, grandchildren    Family History  Problem Relation Age of Onset  . Melanoma Father   . Hyperlipidemia Mother   . Heart disease Mother        no MI  . Diabetes Paternal Grandmother        vision loss  . Diabetes Maternal Uncle   . Colon cancer Neg Hx   . Stomach cancer Neg Hx   . Esophageal cancer Neg Hx   . Rectal cancer Neg Hx   . Stroke Neg Hx     Review of Systems  Constitutional: Positive for fatigue and  fever (subjective). Negative for chills.  HENT: Positive for congestion, sinus pressure and sinus pain. Negative for ear pain and sore throat.   Respiratory: Positive for cough (dry). Negative for shortness of breath and wheezing.   Gastrointestinal: Negative for diarrhea and nausea.  Musculoskeletal: Positive for myalgias (mild).  Neurological: Positive for headaches. Negative for dizziness and light-headedness.       Objective:   Vitals:   07/02/17 0843  BP: 138/84  Pulse: 86  Resp: 16  Temp: 99.6 F (37.6 C)  SpO2: 96%   BP Readings from Last 3 Encounters:  07/02/17 138/84  06/22/17 136/80  01/27/17 126/82   Wt Readings from Last 3 Encounters:  07/02/17 140 lb 12.8 oz (63.9 kg)  06/22/17 142 lb (64.4 kg)  01/27/17 136 lb (61.7 kg)   Body mass index is 24.17 kg/m.   Physical Exam    GENERAL APPEARANCE: Appears stated age, well appearing, NAD EYES: conjunctiva clear, no icterus HEENT: bilateral tympanic membranes and ear canals normal, oropharynx with mild erythema, no thyromegaly, trachea midline, no cervical or supraclavicular lymphadenopathy LUNGS: Clear to auscultation without wheeze or crackles, unlabored breathing, good air entry bilaterally CARDIOVASCULAR: Normal S1,S2 without murmurs, no edema SKIN: Warm, dry      Assessment & Plan:    See Problem List for Assessment and Plan of chronic medical problems.

## 2017-07-02 ENCOUNTER — Ambulatory Visit (INDEPENDENT_AMBULATORY_CARE_PROVIDER_SITE_OTHER): Payer: Medicare HMO | Admitting: Internal Medicine

## 2017-07-02 ENCOUNTER — Encounter: Payer: Self-pay | Admitting: Internal Medicine

## 2017-07-02 VITALS — BP 138/84 | HR 86 | Temp 99.6°F | Resp 16 | Ht 64.0 in | Wt 140.8 lb

## 2017-07-02 DIAGNOSIS — J0100 Acute maxillary sinusitis, unspecified: Secondary | ICD-10-CM | POA: Diagnosis not present

## 2017-07-02 DIAGNOSIS — K219 Gastro-esophageal reflux disease without esophagitis: Secondary | ICD-10-CM

## 2017-07-02 MED ORDER — AMOXICILLIN-POT CLAVULANATE 875-125 MG PO TABS: 1.0000 | ORAL_TABLET | Freq: Two times a day (BID) | ORAL | 0 refills | Status: DC

## 2017-07-02 MED ORDER — RANITIDINE HCL 150 MG PO TABS: 150.0000 mg | ORAL_TABLET | Freq: Two times a day (BID) | ORAL | 5 refills | Status: DC

## 2017-07-02 NOTE — Assessment & Plan Note (Signed)
Likely bacterial  Start augmentin otc cold medications Rest, fluid Call if no improvement  

## 2017-07-02 NOTE — Assessment & Plan Note (Addendum)
New She is experiencing symptoms consistent with GERD-possibly starting with taking Fosamax, which she is no longer taking Reviewed GERD diet-information given Start Zantac 150 mg once-twice daily until GERD is better controlled and then can wean off Discussed the concerns with uncontrolled GERD Call if no improvement

## 2017-07-02 NOTE — Patient Instructions (Signed)
Take the antibiotic as prescribed.  Take over the counter cold medications if needed.    Take the zantac for the reflux.    Gastroesophageal Reflux Disease, Adult Normally, food travels down the esophagus and stays in the stomach to be digested. However, when a person has gastroesophageal reflux disease (GERD), food and stomach acid move back up into the esophagus. When this happens, the esophagus becomes sore and inflamed. Over time, GERD can create small holes (ulcers) in the lining of the esophagus. What are the causes? This condition is caused by a problem with the muscle between the esophagus and the stomach (lower esophageal sphincter, or LES). Normally, the LES muscle closes after food passes through the esophagus to the stomach. When the LES is weakened or abnormal, it does not close properly, and that allows food and stomach acid to go back up into the esophagus. The LES can be weakened by certain dietary substances, medicines, and medical conditions, including:  Tobacco use.  Pregnancy.  Having a hiatal hernia.  Heavy alcohol use.  Certain foods and beverages, such as coffee, chocolate, onions, and peppermint.  What increases the risk? This condition is more likely to develop in:  People who have an increased body weight.  People who have connective tissue disorders.  People who use NSAID medicines.  What are the signs or symptoms? Symptoms of this condition include:  Heartburn.  Difficult or painful swallowing.  The feeling of having a lump in the throat.  Abitter taste in the mouth.  Bad breath.  Having a large amount of saliva.  Having an upset or bloated stomach.  Belching.  Chest pain.  Shortness of breath or wheezing.  Ongoing (chronic) cough or a night-time cough.  Wearing away of tooth enamel.  Weight loss.  Different conditions can cause chest pain. Make sure to see your health care provider if you experience chest pain. How is this  diagnosed? Your health care provider will take a medical history and perform a physical exam. To determine if you have mild or severe GERD, your health care provider may also monitor how you respond to treatment. You may also have other tests, including:  An endoscopy toexamine your stomach and esophagus with a small camera.  A test thatmeasures the acidity level in your esophagus.  A test thatmeasures how much pressure is on your esophagus.  A barium swallow or modified barium swallow to show the shape, size, and functioning of your esophagus.  How is this treated? The goal of treatment is to help relieve your symptoms and to prevent complications. Treatment for this condition may vary depending on how severe your symptoms are. Your health care provider may recommend:  Changes to your diet.  Medicine.  Surgery.  Follow these instructions at home: Diet  Follow a diet as recommended by your health care provider. This may involve avoiding foods and drinks such as: ? Coffee and tea (with or without caffeine). ? Drinks that containalcohol. ? Energy drinks and sports drinks. ? Carbonated drinks or sodas. ? Chocolate and cocoa. ? Peppermint and mint flavorings. ? Garlic and onions. ? Horseradish. ? Spicy and acidic foods, including peppers, chili powder, curry powder, vinegar, hot sauces, and barbecue sauce. ? Citrus fruit juices and citrus fruits, such as oranges, lemons, and limes. ? Tomato-based foods, such as red sauce, chili, salsa, and pizza with red sauce. ? Fried and fatty foods, such as donuts, french fries, potato chips, and high-fat dressings. ? High-fat meats, such as hot  dogs and fatty cuts of red and white meats, such as rib eye steak, sausage, ham, and bacon. ? High-fat dairy items, such as whole milk, butter, and cream cheese.  Eat small, frequent meals instead of large meals.  Avoid drinking large amounts of liquid with your meals.  Avoid eating meals during  the 2-3 hours before bedtime.  Avoid lying down right after you eat.  Do not exercise right after you eat. General instructions  Pay attention to any changes in your symptoms.  Take over-the-counter and prescription medicines only as told by your health care provider. Do not take aspirin, ibuprofen, or other NSAIDs unless your health care provider told you to do so.  Do not use any tobacco products, including cigarettes, chewing tobacco, and e-cigarettes. If you need help quitting, ask your health care provider.  Wear loose-fitting clothing. Do not wear anything tight around your waist that causes pressure on your abdomen.  Raise (elevate) the head of your bed 6 inches (15cm).  Try to reduce your stress, such as with yoga or meditation. If you need help reducing stress, ask your health care provider.  If you are overweight, reduce your weight to an amount that is healthy for you. Ask your health care provider for guidance about a safe weight loss goal.  Keep all follow-up visits as told by your health care provider. This is important. Contact a health care provider if:  You have new symptoms.  You have unexplained weight loss.  You have difficulty swallowing, or it hurts to swallow.  You have wheezing or a persistent cough.  Your symptoms do not improve with treatment.  You have a hoarse voice. Get help right away if:  You have pain in your arms, neck, jaw, teeth, or back.  You feel sweaty, dizzy, or light-headed.  You have chest pain or shortness of breath.  You vomit and your vomit looks like blood or coffee grounds.  You faint.  Your stool is bloody or black.  You cannot swallow, drink, or eat. This information is not intended to replace advice given to you by your health care provider. Make sure you discuss any questions you have with your health care provider. Document Released: 12/18/2004 Document Revised: 08/08/2015 Document Reviewed: 07/05/2014 Elsevier  Interactive Patient Education  [employer].

## 2017-07-03 DIAGNOSIS — M25612 Stiffness of left shoulder, not elsewhere classified: Secondary | ICD-10-CM | POA: Diagnosis not present

## 2017-07-03 DIAGNOSIS — M25512 Pain in left shoulder: Secondary | ICD-10-CM | POA: Diagnosis not present

## 2017-07-06 ENCOUNTER — Encounter: Payer: Self-pay | Admitting: Internal Medicine

## 2017-07-07 ENCOUNTER — Ambulatory Visit: Payer: Medicare HMO | Admitting: Internal Medicine

## 2017-07-07 DIAGNOSIS — M25612 Stiffness of left shoulder, not elsewhere classified: Secondary | ICD-10-CM | POA: Diagnosis not present

## 2017-07-07 DIAGNOSIS — M25512 Pain in left shoulder: Secondary | ICD-10-CM | POA: Diagnosis not present

## 2017-07-07 DIAGNOSIS — Z0289 Encounter for other administrative examinations: Secondary | ICD-10-CM

## 2017-07-07 NOTE — Progress Notes (Deleted)
Subjective:    Patient ID: Linda [name], female    DOB: 01/04/1951, 67 y.o.   MRN: [id number]  HPI The patient is here for an acute visit.  Sinus infection/headache: I saw her 4/11 for sinus infection.  She had typical symptoms of a sinus infection including headaches.  I prescribed Augmentin, which she is still taking.   Medications and allergies reviewed with patient and updated if appropriate.  Patient Active Problem List   Diagnosis Date Noted  . Acute non-recurrent maxillary sinusitis 07/02/2017  . Gastroesophageal reflux disease 07/02/2017  . History of colonic polyps 05/22/2014  . Hyperglycemia 09/04/2013  . Allergic rhinitis 08/06/2013  . ANXIETY STATE, UNSPECIFIED 11/29/2009  . Vitamin D deficiency 04/10/2008  . Hyperlipidemia 10/04/2007  . Osteoporosis 10/04/2007    Current Outpatient Medications on File Prior to Visit  Medication Sig Dispense Refill  . amoxicillin-clavulanate (AUGMENTIN) 875-125 MG tablet Take 1 tablet by mouth 2 (two) times daily. 20 tablet 0  . Ascorbic Acid (VITAMIN C) 1000 MG tablet Take 1,000 mg by mouth daily.      . Calcium Carb-Cholecalciferol (CALCIUM 1000 + D PO) Take by mouth daily.      . cholecalciferol (VITAMIN D) 1000 units tablet Take 1.5 tablets (1,500 Units total) by mouth daily. 90 tablet 2  . ranitidine (ZANTAC) 150 MG tablet Take 1 tablet (150 mg total) by mouth 2 (two) times daily. 60 tablet 5   No current facility-administered medications on file prior to visit.     Past Medical History:  Diagnosis Date  . Cellulitis of leg, right 08/27/2016  . Hyperlipidemia   . Osteopenia   . Vitamin D deficiency     Past Surgical History:  Procedure Laterality Date  . COLONOSCOPY  03/2011   Dr Brodie;diminuitive polyp  . G 2 P 2    . TONSILLECTOMY      Social History   Socioeconomic History  . Marital status: Married    Spouse name: Not on file  . Number of children: 2  . Years of education: Not on file  . Highest  education level: Not on file  Occupational History  . Occupation: Science writer  Social Needs  . Financial resource strain: Not on file  . Food insecurity:    Worry: Not on file    Inability: Not on file  . Transportation needs:    Medical: Not on file    Non-medical: Not on file  Tobacco Use  . Smoking status: Never Smoker  . Smokeless tobacco: Never Used  Substance and Sexual Activity  . Alcohol use: Yes    Alcohol/week: 8.4 oz    Types: 14 Glasses of wine per week    Comment: wine nightly  . Drug use: No  . Sexual activity: Not on file  Lifestyle  . Physical activity:    Days per week: Not on file    Minutes per session: Not on file  . Stress: Not on file  Relationships  . Social connections:    Talks on phone: Not on file    Gets together: Not on file    Attends religious service: Not on file    Active member of club or organization: Not on file    Attends meetings of clubs or organizations: Not on file    Relationship status: Not on file  Other Topics Concern  . Not on file  Social History Narrative   Exercise:  Yoga, weights, walking      Two  children, grandchildren    Family History  Problem Relation Age of Onset  . Melanoma Father   . Hyperlipidemia Mother   . Heart disease Mother        no MI  . Diabetes Paternal Grandmother        vision loss  . Diabetes Maternal Uncle   . Colon cancer Neg Hx   . Stomach cancer Neg Hx   . Esophageal cancer Neg Hx   . Rectal cancer Neg Hx   . Stroke Neg Hx     Review of Systems     Objective:  There were no vitals filed for this visit. BP Readings from Last 3 Encounters:  07/02/17 138/84  06/22/17 136/80  01/27/17 126/82   Wt Readings from Last 3 Encounters:  07/02/17 140 lb 12.8 oz (63.9 kg)  06/22/17 142 lb (64.4 kg)  01/27/17 136 lb (61.7 kg)   There is no height or weight on file to calculate BMI.   Physical Exam         Assessment & Plan:    See Problem List for Assessment and Plan of  chronic medical problems.

## 2017-07-09 DIAGNOSIS — M25612 Stiffness of left shoulder, not elsewhere classified: Secondary | ICD-10-CM | POA: Diagnosis not present

## 2017-07-09 DIAGNOSIS — M25512 Pain in left shoulder: Secondary | ICD-10-CM | POA: Diagnosis not present

## 2017-07-15 DIAGNOSIS — M25612 Stiffness of left shoulder, not elsewhere classified: Secondary | ICD-10-CM | POA: Diagnosis not present

## 2017-07-15 DIAGNOSIS — M25512 Pain in left shoulder: Secondary | ICD-10-CM | POA: Diagnosis not present

## 2017-07-17 DIAGNOSIS — M25612 Stiffness of left shoulder, not elsewhere classified: Secondary | ICD-10-CM | POA: Diagnosis not present

## 2017-07-17 DIAGNOSIS — M25512 Pain in left shoulder: Secondary | ICD-10-CM | POA: Diagnosis not present

## 2017-07-23 DIAGNOSIS — D2271 Melanocytic nevi of right lower limb, including hip: Secondary | ICD-10-CM | POA: Diagnosis not present

## 2017-07-23 DIAGNOSIS — D225 Melanocytic nevi of trunk: Secondary | ICD-10-CM | POA: Diagnosis not present

## 2017-07-23 DIAGNOSIS — L821 Other seborrheic keratosis: Secondary | ICD-10-CM | POA: Diagnosis not present

## 2017-07-23 DIAGNOSIS — D2272 Melanocytic nevi of left lower limb, including hip: Secondary | ICD-10-CM | POA: Diagnosis not present

## 2017-07-23 DIAGNOSIS — L738 Other specified follicular disorders: Secondary | ICD-10-CM | POA: Diagnosis not present

## 2017-08-13 NOTE — Progress Notes (Signed)
Subjective:    Patient ID: Linda [name], female    DOB: 1950-06-27, 67 y.o.   MRN: [id number]  HPI The patient is here for an acute visit.   Persistent cough:  It started about 1-2 months ago. The cough is wet sounding.  It is not constant.  It is worse with laying down.  She denies other symptoms.  She thinks it is leftover from a cold.  She denies any active cold symptoms, fever, chills, shortness of breath and wheezing.  I did treat her for sinus infection over month ago no symptoms have resolved.  GERD: When she was here last month she was experiencing frequent GERD.  I started her on Zantac.  She states she is only taking that as needed.  She does have intermittent GERD.  Right mid back discomfort: For a while she has been experiencing some right mid back discomfort.  She may just be muscular.  She is unsure if any particular position or activity makes it worse.  The pain is mild and has not worsened.  She denies any urinary symptoms including blood in urine, painful urination and more frequent urination.  Medications and allergies reviewed with patient and updated if appropriate.  Patient Active Problem List   Diagnosis Date Noted  . Acute non-recurrent maxillary sinusitis 07/02/2017  . Gastroesophageal reflux disease 07/02/2017  . History of colonic polyps 05/22/2014  . Hyperglycemia 09/04/2013  . Allergic rhinitis 08/06/2013  . ANXIETY STATE, UNSPECIFIED 11/29/2009  . Vitamin D deficiency 04/10/2008  . Hyperlipidemia 10/04/2007  . Osteoporosis 10/04/2007    Current Outpatient Medications on File Prior to Visit  Medication Sig Dispense Refill  . Ascorbic Acid (VITAMIN C) 1000 MG tablet Take 1,000 mg by mouth daily.      . Calcium Carb-Cholecalciferol (CALCIUM 1000 + D PO) Take by mouth daily.      . cholecalciferol (VITAMIN D) 1000 units tablet Take 1.5 tablets (1,500 Units total) by mouth daily. 90 tablet 2  . ranitidine (ZANTAC) 150 MG tablet Take 1 tablet (150 mg  total) by mouth 2 (two) times daily. (Patient taking differently: Take 150 mg by mouth 2 (two) times daily as needed. ) 60 tablet 5   No current facility-administered medications on file prior to visit.     Past Medical History:  Diagnosis Date  . Cellulitis of leg, right 08/27/2016  . Hyperlipidemia   . Osteopenia   . Vitamin D deficiency     Past Surgical History:  Procedure Laterality Date  . COLONOSCOPY  03/2011   Dr Brodie;diminuitive polyp  . G 2 P 2    . TONSILLECTOMY      Social History   Socioeconomic History  . Marital status: Married    Spouse name: Not on file  . Number of children: 2  . Years of education: Not on file  . Highest education level: Not on file  Occupational History  . Occupation: Science writer  Social Needs  . Financial resource strain: Not on file  . Food insecurity:    Worry: Not on file    Inability: Not on file  . Transportation needs:    Medical: Not on file    Non-medical: Not on file  Tobacco Use  . Smoking status: Never Smoker  . Smokeless tobacco: Never Used  Substance and Sexual Activity  . Alcohol use: Yes    Alcohol/week: 8.4 oz    Types: 14 Glasses of wine per week    Comment: wine nightly  .  Drug use: No  . Sexual activity: Not on file  Lifestyle  . Physical activity:    Days per week: Not on file    Minutes per session: Not on file  . Stress: Not on file  Relationships  . Social connections:    Talks on phone: Not on file    Gets together: Not on file    Attends religious service: Not on file    Active member of club or organization: Not on file    Attends meetings of clubs or organizations: Not on file    Relationship status: Not on file  Other Topics Concern  . Not on file  Social History Narrative   Exercise:  Yoga, weights, walking      Two children, grandchildren    Family History  Problem Relation Age of Onset  . Melanoma Father   . Hyperlipidemia Mother   . Heart disease Mother        no MI  .  Diabetes Paternal Grandmother        vision loss  . Diabetes Maternal Uncle   . Colon cancer Neg Hx   . Stomach cancer Neg Hx   . Esophageal cancer Neg Hx   . Rectal cancer Neg Hx   . Stroke Neg Hx     Review of Systems  Constitutional: Negative for appetite change, chills, fatigue and fever.  HENT: Negative for congestion, ear pain, sinus pressure and sore throat.   Respiratory: Positive for cough. Negative for chest tightness, shortness of breath and wheezing.   Neurological: Negative for light-headedness and headaches.       Objective:   Vitals:   08/14/17 0841  BP: 128/84  Pulse: 88  Resp: 16  Temp: 98.8 F (37.1 C)  SpO2: 96%   BP Readings from Last 3 Encounters:  08/14/17 128/84  07/02/17 138/84  06/22/17 136/80   Wt Readings from Last 3 Encounters:  08/14/17 140 lb (63.5 kg)  07/02/17 140 lb 12.8 oz (63.9 kg)  06/22/17 142 lb (64.4 kg)   Body mass index is 24.03 kg/m.   Physical Exam    GENERAL APPEARANCE: Appears stated age, well appearing, NAD EYES: conjunctiva clear, no icterus HEENT: bilateral tympanic membranes and ear canals normal, oropharynx with mild erythema, no thyromegaly, trachea midline, no cervical or supraclavicular lymphadenopathy LUNGS: Clear to auscultation without wheeze or crackles, unlabored breathing, good air entry bilaterally CARDIOVASCULAR: Normal S1,S2 without murmurs, no edema MSK:  Mild tenderness right cva-seems to have some point tenderness along lower ribs, no obvious psoas muscle tenderness SKIN: Warm, dry      Assessment & Plan:    See Problem List for Assessment and Plan of chronic medical problems.

## 2017-08-14 ENCOUNTER — Ambulatory Visit (INDEPENDENT_AMBULATORY_CARE_PROVIDER_SITE_OTHER): Payer: Medicare HMO | Admitting: Internal Medicine

## 2017-08-14 ENCOUNTER — Encounter: Payer: Self-pay | Admitting: Internal Medicine

## 2017-08-14 VITALS — BP 128/84 | HR 88 | Temp 98.8°F | Resp 16 | Wt 140.0 lb

## 2017-08-14 DIAGNOSIS — R059 Cough, unspecified: Secondary | ICD-10-CM

## 2017-08-14 DIAGNOSIS — K219 Gastro-esophageal reflux disease without esophagitis: Secondary | ICD-10-CM | POA: Diagnosis not present

## 2017-08-14 DIAGNOSIS — R05 Cough: Secondary | ICD-10-CM | POA: Diagnosis not present

## 2017-08-14 DIAGNOSIS — M549 Dorsalgia, unspecified: Secondary | ICD-10-CM

## 2017-08-14 NOTE — Assessment & Plan Note (Signed)
GERD is not adequately controlled She is only taking Zantac as needed Advised her to take 1 pill twice daily-can decrease dose in the future

## 2017-08-14 NOTE — Patient Instructions (Addendum)
Your cough is likely related to heartburn.    Start the zantac twice daily for at least two weeks.  If your cough does not improve please let me know.     If your right mid back pain persists let me know and we will check your urine and do an ultrasound.     Gastroesophageal Reflux Disease, Adult Normally, food travels down the esophagus and stays in the stomach to be digested. However, when a person has gastroesophageal reflux disease (GERD), food and stomach acid move back up into the esophagus. When this happens, the esophagus becomes sore and inflamed. Over time, GERD can create small holes (ulcers) in the lining of the esophagus. What are the causes? This condition is caused by a problem with the muscle between the esophagus and the stomach (lower esophageal sphincter, or LES). Normally, the LES muscle closes after food passes through the esophagus to the stomach. When the LES is weakened or abnormal, it does not close properly, and that allows food and stomach acid to go back up into the esophagus. The LES can be weakened by certain dietary substances, medicines, and medical conditions, including:  Tobacco use.  Pregnancy.  Having a hiatal hernia.  Heavy alcohol use.  Certain foods and beverages, such as coffee, chocolate, onions, and peppermint.  What increases the risk? This condition is more likely to develop in:  People who have an increased body weight.  People who have connective tissue disorders.  People who use NSAID medicines.  What are the signs or symptoms? Symptoms of this condition include:  Heartburn.  Difficult or painful swallowing.  The feeling of having a lump in the throat.  Abitter taste in the mouth.  Bad breath.  Having a large amount of saliva.  Having an upset or bloated stomach.  Belching.  Chest pain.  Shortness of breath or wheezing.  Ongoing (chronic) cough or a night-time cough.  Wearing away of tooth enamel.  Weight  loss.  Different conditions can cause chest pain. Make sure to see your health care provider if you experience chest pain. How is this diagnosed? Your health care provider will take a medical history and perform a physical exam. To determine if you have mild or severe GERD, your health care provider may also monitor how you respond to treatment. You may also have other tests, including:  An endoscopy toexamine your stomach and esophagus with a small camera.  A test thatmeasures the acidity level in your esophagus.  A test thatmeasures how much pressure is on your esophagus.  A barium swallow or modified barium swallow to show the shape, size, and functioning of your esophagus.  How is this treated? The goal of treatment is to help relieve your symptoms and to prevent complications. Treatment for this condition may vary depending on how severe your symptoms are. Your health care provider may recommend:  Changes to your diet.  Medicine.  Surgery.  Follow these instructions at home: Diet  Follow a diet as recommended by your health care provider. This may involve avoiding foods and drinks such as: ? Coffee and tea (with or without caffeine). ? Drinks that containalcohol. ? Energy drinks and sports drinks. ? Carbonated drinks or sodas. ? Chocolate and cocoa. ? Peppermint and mint flavorings. ? Garlic and onions. ? Horseradish. ? Spicy and acidic foods, including peppers, chili powder, curry powder, vinegar, hot sauces, and barbecue sauce. ? Citrus fruit juices and citrus fruits, such as oranges, lemons, and limes. ? Tomato-based  foods, such as red sauce, chili, salsa, and pizza with red sauce. ? Fried and fatty foods, such as donuts, french fries, potato chips, and high-fat dressings. ? High-fat meats, such as hot dogs and fatty cuts of red and white meats, such as rib eye steak, sausage, ham, and bacon. ? High-fat dairy items, such as whole milk, butter, and cream  cheese.  Eat small, frequent meals instead of large meals.  Avoid drinking large amounts of liquid with your meals.  Avoid eating meals during the 2-3 hours before bedtime.  Avoid lying down right after you eat.  Do not exercise right after you eat. General instructions  Pay attention to any changes in your symptoms.  Take over-the-counter and prescription medicines only as told by your health care provider. Do not take aspirin, ibuprofen, or other NSAIDs unless your health care provider told you to do so.  Do not use any tobacco products, including cigarettes, chewing tobacco, and e-cigarettes. If you need help quitting, ask your health care provider.  Wear loose-fitting clothing. Do not wear anything tight around your waist that causes pressure on your abdomen.  Raise (elevate) the head of your bed 6 inches (15cm).  Try to reduce your stress, such as with yoga or meditation. If you need help reducing stress, ask your health care provider.  If you are overweight, reduce your weight to an amount that is healthy for you. Ask your health care provider for guidance about a safe weight loss goal.  Keep all follow-up visits as told by your health care provider. This is important. Contact a health care provider if:  You have new symptoms.  You have unexplained weight loss.  You have difficulty swallowing, or it hurts to swallow.  You have wheezing or a persistent cough.  Your symptoms do not improve with treatment.  You have a hoarse voice. Get help right away if:  You have pain in your arms, neck, jaw, teeth, or back.  You feel sweaty, dizzy, or light-headed.  You have chest pain or shortness of breath.  You vomit and your vomit looks like blood or coffee grounds.  You faint.  Your stool is bloody or black.  You cannot swallow, drink, or eat. This information is not intended to replace advice given to you by your health care provider. Make sure you discuss any  questions you have with your health care provider. Document Released: 12/18/2004 Document Revised: 08/08/2015 Document Reviewed: 07/05/2014 Elsevier Interactive Patient Education  [employer].

## 2017-08-14 NOTE — Assessment & Plan Note (Signed)
Having a persistent cough for the past 1-2 months No other concerning symptoms She is experiencing some GERD and the cough is likely related to that Start Zantac 150 milligrams twice daily and take consistently for at least 2 weeks If symptoms improve this confirms that it is GERD Can decrease medication depending on symptoms If cough does not improve she will let me know

## 2017-08-14 NOTE — Assessment & Plan Note (Signed)
For some time she has had some right mid back pain-at the medial lower edge of her ribs There is mild tenderness to palpation ?  Psoas muscle, rib pain/cartilage inflammation, less likely kidney pain No urinary symptoms She is unsure if it is related to any certain activity or position Discussed kidney US and urine testing -- she will monitor for now and let me know if symptoms persist and will then do testing.

## 2017-09-02 ENCOUNTER — Encounter: Payer: Self-pay | Admitting: Internal Medicine

## 2017-09-02 ENCOUNTER — Ambulatory Visit (INDEPENDENT_AMBULATORY_CARE_PROVIDER_SITE_OTHER): Payer: Medicare HMO | Admitting: Internal Medicine

## 2017-09-02 VITALS — BP 130/76 | HR 70 | Temp 99.3°F | Resp 16 | Wt 137.0 lb

## 2017-09-02 DIAGNOSIS — K219 Gastro-esophageal reflux disease without esophagitis: Secondary | ICD-10-CM

## 2017-09-02 DIAGNOSIS — R05 Cough: Secondary | ICD-10-CM | POA: Diagnosis not present

## 2017-09-02 DIAGNOSIS — R059 Cough, unspecified: Secondary | ICD-10-CM

## 2017-09-02 NOTE — Assessment & Plan Note (Signed)
GERD controlled Continue daily medication  

## 2017-09-02 NOTE — Progress Notes (Signed)
Subjective:    Patient ID: Linda [name], female    DOB: 1951/01/17, 67 y.o.   MRN: [id number]  HPI The patient is here for an acute visit.   Cough:     She is staill has the cough - it started about 2/5 months ago.  She hsas been takig zantac - no gerd  Medications and allergies reviewed with patient and updated if appropriate.  Patient Active Problem List   Diagnosis Date Noted  . Mid back pain on right side 08/14/2017  . Gastroesophageal reflux disease 07/02/2017  . Cough 07/02/2016  . History of colonic polyps 05/22/2014  . Hyperglycemia 09/04/2013  . Allergic rhinitis 08/06/2013  . ANXIETY STATE, UNSPECIFIED 11/29/2009  . Vitamin D deficiency 04/10/2008  . Hyperlipidemia 10/04/2007  . Osteoporosis 10/04/2007    Current Outpatient Medications on File Prior to Visit  Medication Sig Dispense Refill  . Ascorbic Acid (VITAMIN C) 1000 MG tablet Take 1,000 mg by mouth daily.      . Calcium Carb-Cholecalciferol (CALCIUM 1000 + D PO) Take by mouth daily.      . cholecalciferol (VITAMIN D) 1000 units tablet Take 1.5 tablets (1,500 Units total) by mouth daily. 90 tablet 2  . ranitidine (ZANTAC) 150 MG tablet Take 1 tablet (150 mg total) by mouth 2 (two) times daily. (Patient taking differently: Take 150 mg by mouth 2 (two) times daily as needed. ) 60 tablet 5   No current facility-administered medications on file prior to visit.     Past Medical History:  Diagnosis Date  . Cellulitis of leg, right 08/27/2016  . Hyperlipidemia   . Osteopenia   . Vitamin D deficiency     Past Surgical History:  Procedure Laterality Date  . COLONOSCOPY  03/2011   Dr Brodie;diminuitive polyp  . G 2 P 2    . TONSILLECTOMY      Social History   Socioeconomic History  . Marital status: Married    Spouse name: Not on file  . Number of children: 2  . Years of education: Not on file  . Highest education level: Not on file  Occupational History  . Occupation: Science writer  Social Needs  .  Financial resource strain: Not on file  . Food insecurity:    Worry: Not on file    Inability: Not on file  . Transportation needs:    Medical: Not on file    Non-medical: Not on file  Tobacco Use  . Smoking status: Never Smoker  . Smokeless tobacco: Never Used  Substance and Sexual Activity  . Alcohol use: Yes    Alcohol/week: 8.4 oz    Types: 14 Glasses of wine per week    Comment: wine nightly  . Drug use: No  . Sexual activity: Not on file  Lifestyle  . Physical activity:    Days per week: Not on file    Minutes per session: Not on file  . Stress: Not on file  Relationships  . Social connections:    Talks on phone: Not on file    Gets together: Not on file    Attends religious service: Not on file    Active member of club or organization: Not on file    Attends meetings of clubs or organizations: Not on file    Relationship status: Not on file  Other Topics Concern  . Not on file  Social History Narrative   Exercise:  Yoga, weights, walking      Two  children, grandchildren    Family History  Problem Relation Age of Onset  . Melanoma Father   . Hyperlipidemia Mother   . Heart disease Mother        no MI  . Diabetes Paternal Grandmother        vision loss  . Diabetes Maternal Uncle   . Colon cancer Neg Hx   . Stomach cancer Neg Hx   . Esophageal cancer Neg Hx   . Rectal cancer Neg Hx   . Stroke Neg Hx     Review of Systems  Constitutional: Negative for chills and fever.  HENT: Positive for postnasal drip. Negative for congestion, ear pain and sore throat.   Respiratory: Positive for cough (sounds wet). Negative for chest tightness, shortness of breath and wheezing.   Gastrointestinal:       Denies gerd  Neurological: Negative for light-headedness and headaches.       Objective:   Vitals:   09/02/17 1306  BP: 130/76  Pulse: 70  Resp: 16  Temp: 99.3 F (37.4 C)  SpO2: 97%   BP Readings from Last 3 Encounters:  09/02/17 130/76  08/14/17  128/84  07/02/17 138/84   Wt Readings from Last 3 Encounters:  09/02/17 137 lb (62.1 kg)  08/14/17 140 lb (63.5 kg)  07/02/17 140 lb 12.8 oz (63.9 kg)   Body mass index is 23.52 kg/m.   Physical Exam    GENERAL APPEARANCE: Appears stated age, well appearing, NAD EYES: conjunctiva clear, no icterus HEENT: bilateral tympanic membranes and ear canals normal, oropharynx with no erythema, no thyromegaly, trachea midline, no cervical or supraclavicular lymphadenopathy LUNGS: Clear to auscultation without wheeze or crackles, unlabored breathing, good air entry bilaterally CARDIOVASCULAR: Normal S1,S2 without murmurs, no edema SKIN: Warm, dry      Assessment & Plan:    See Problem List for Assessment and Plan of chronic medical problems.

## 2017-09-02 NOTE — Patient Instructions (Addendum)
Take claritin every morning for 4 weeks.  Continue the zantac daily.     Call if no improvement     Cough, Adult Coughing is a reflex that clears your throat and your airways. Coughing helps to heal and protect your lungs. It is normal to cough occasionally, but a cough that happens with other symptoms or lasts a long time may be a sign of a condition that needs treatment. A cough may last only 2-3 weeks (acute), or it may last longer than 8 weeks (chronic). What are the causes? Coughing is commonly caused by:  Breathing in substances that irritate your lungs.  A viral or bacterial respiratory infection.  Allergies.  Asthma.  Postnasal drip.  Smoking.  Acid backing up from the stomach into the esophagus (gastroesophageal reflux).  Certain medicines.  Chronic lung problems, including COPD (or rarely, lung cancer).  Other medical conditions such as heart failure.  Follow these instructions at home: Pay attention to any changes in your symptoms. Take these actions to help with your discomfort:  Take medicines only as told by your health care provider. ? If you were prescribed an antibiotic medicine, take it as told by your health care provider. Do not stop taking the antibiotic even if you start to feel better. ? Talk with your health care provider before you take a cough suppressant medicine.  Drink enough fluid to keep your urine clear or pale yellow.  If the air is dry, use a cold steam vaporizer or humidifier in your bedroom or your home to help loosen secretions.  Avoid anything that causes you to cough at work or at home.  If your cough is worse at night, try sleeping in a semi-upright position.  Avoid cigarette smoke. If you smoke, quit smoking. If you need help quitting, ask your health care provider.  Avoid caffeine.  Avoid alcohol.  Rest as needed.  Contact a health care provider if:  You have new symptoms.  You cough up pus.  Your cough does not  get better after 2-3 weeks, or your cough gets worse.  You cannot control your cough with suppressant medicines and you are losing sleep.  You develop pain that is getting worse or pain that is not controlled with pain medicines.  You have a fever.  You have unexplained weight loss.  You have night sweats. Get help right away if:  You cough up blood.  You have difficulty breathing.  Your heartbeat is very fast. This information is not intended to replace advice given to you by your health care provider. Make sure you discuss any questions you have with your health care provider. Document Released: 09/06/2010 Document Revised: 08/16/2015 Document Reviewed: 05/17/2014 Elsevier Interactive Patient Education  [employer].

## 2017-09-02 NOTE — Assessment & Plan Note (Signed)
Persistent cough for over 2 months gerd now controlled - continue zantac 300 mg daily She has some PND - start claritin daily - take for at least 4 weeks No evidence of infection - no abx needed If cough is persistent will get a cxr and consider pulm referral

## 2017-09-17 ENCOUNTER — Encounter: Payer: Self-pay | Admitting: Internal Medicine

## 2017-10-11 ENCOUNTER — Encounter: Payer: Self-pay | Admitting: Internal Medicine

## 2017-10-27 ENCOUNTER — Telehealth: Payer: Self-pay | Admitting: Internal Medicine

## 2017-10-27 NOTE — Telephone Encounter (Signed)
I will see her 

## 2017-10-27 NOTE — Telephone Encounter (Signed)
Would you be willing to see this patient's mother? I called [name] to find out more info. Name:Linda [name]  Date of Birth: 01/04/1927  Daughter states that she takes medication for her heart but is over all pretty heathy and lives alone. She just moved here and is needing a primary care physician.

## 2017-10-27 NOTE — Telephone Encounter (Signed)
Copied from [place] [phone]. Topic: General - Other >> Oct 27, 2017  8:59 AM Judyann Munson wrote: Reason for CRM: Patient is calling to see if Dr. Quay Burow would take her mother care that has moved in town,for a  new patient. Please advise

## 2017-10-28 NOTE — Telephone Encounter (Signed)
Left message informing Linda [name] of Dr Quay Burow' response.  Appears that patient's mother already has a chart and is scheduled to be a new patient at [facility]. If they would like to see Dr Quay Burow, the other appointment would need to be canceled.

## 2017-11-04 ENCOUNTER — Encounter: Payer: Self-pay | Admitting: Internal Medicine

## 2017-11-04 DIAGNOSIS — Z0100 Encounter for examination of eyes and vision without abnormal findings: Secondary | ICD-10-CM | POA: Diagnosis not present

## 2017-11-04 DIAGNOSIS — R05 Cough: Secondary | ICD-10-CM

## 2017-11-04 DIAGNOSIS — R053 Chronic cough: Secondary | ICD-10-CM

## 2017-11-06 ENCOUNTER — Encounter: Payer: Self-pay | Admitting: Internal Medicine

## 2017-11-17 ENCOUNTER — Encounter: Payer: Self-pay | Admitting: Internal Medicine

## 2017-11-17 ENCOUNTER — Ambulatory Visit: Payer: Medicare HMO | Admitting: Internal Medicine

## 2017-11-17 VITALS — BP 116/72 | HR 72 | Ht 63.5 in | Wt 135.0 lb

## 2017-11-17 DIAGNOSIS — R05 Cough: Secondary | ICD-10-CM | POA: Diagnosis not present

## 2017-11-17 DIAGNOSIS — R058 Other specified cough: Secondary | ICD-10-CM | POA: Insufficient documentation

## 2017-11-17 LAB — NITRIC OXIDE: Nitric Oxide: 33

## 2017-11-17 MED ORDER — PANTOPRAZOLE SODIUM 40 MG PO TBEC: 40.0000 mg | DELAYED_RELEASE_TABLET | Freq: Every day | ORAL | 2 refills | Status: DC

## 2017-11-17 MED ORDER — PREDNISONE 10 MG PO TABS
ORAL_TABLET | ORAL | 0 refills | Status: DC
Start: 2017-11-17 — End: 2017-12-15

## 2017-11-17 MED ORDER — AMOXICILLIN-POT CLAVULANATE 875-125 MG PO TABS: 1.0000 | ORAL_TABLET | Freq: Two times a day (BID) | ORAL | 0 refills | Status: AC

## 2017-11-17 NOTE — Progress Notes (Signed)
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 Pulmonary Consult    Referred by Dr. Billey Gosling. Pt c/o cough x 6 months. She states cough is occ prod with clear sputum. Cough is esp worse at night and will occ wake her.   Dyspnea:  Not limited by breathing from desired activities   Cough: worse every night immedicately when  supine/ better once asleep Sleep: fine most nocts SABA use: no inhaler Better p rx with augmentin transiently   No obvious day to day or daytime variability or assoc excess/ purulent sputum or mucus plugs or hemoptysis or cp or chest tightness, subjective wheeze or overt sinus  symptoms.   Sleeping as above without nocturnal  or early am exacerbation  of respiratory  c/o's or need for noct saba. Also denies any obvious fluctuation of symptoms with weather or environmental changes or other aggravating or alleviating factors except as outlined above   No unusual exposure hx or h/o childhood pna/ asthma or knowledge of premature birth.  Current Allergies, Complete Past Medical History, Past Surgical History, Family History, and Social History were reviewed in [facility] record.  ROS  The following are not active complaints unless bolded Hoarseness, sore throat, dysphagia, dental problems, itching, sneezing,  nasal congestion or discharge of excess mucus or purulent secretions, ear ache,   fever, chills, sweats, unintended wt loss or wt gain, classically pleuritic or exertional cp,  orthopnea pnd or arm/hand swelling  or leg swelling, presyncope, palpitations, abdominal pain, anorexia, nausea, vomiting, diarrhea  or change in bowel habits or change in bladder habits, change in stools or change in urine, dysuria, hematuria,  rash, arthralgias, visual complaints, headache, numbness, weakness or ataxia or problems with walking or coordination,  change in mood or  memory.             Past Medical History:  Diagnosis Date  . Cellulitis of leg, right 08/27/2016  . Hyperlipidemia   . Osteopenia   . Vitamin D deficiency     Outpatient Medications Prior to Visit  Medication Sig Dispense Refill  . Ascorbic Acid (VITAMIN C) 1000 MG tablet Take 1,000 mg by mouth daily.      . Calcium Carb-Cholecalciferol (CALCIUM 1000 + D PO) Take by mouth daily.      . cholecalciferol (VITAMIN D) 1000 units tablet Take 1.5 tablets (1,500 Units total) by mouth daily. 90 tablet 2  . ranitidine (ZANTAC) 150 MG tablet Take 1 tablet (150 mg total) by mouth 2 (two) times daily. (Patient taking differently: Take 150 mg  by mouth 2 (two) times daily as needed. ) 60 tablet 5   No facility-administered medications prior to visit.            Objective:     BP 116/72 (BP Location: Left Arm, Cuff Size: Normal)   Pulse 72   Ht 5' 3.5" (1.613 m)   Wt 135 lb (61.2 kg)   SpO2 97%   BMI 23.54 kg/m   SpO2: 97 % RA   HEENT: nl dentition, turbinates bilaterally, and oropharynx. Nl external ear canals without cough reflex   NECK :  without JVD/Nodes/TM/ nl carotid upstrokes bilaterally   LUNGS: no acc muscle use,  Nl contour chest which is clear to A and P  bilaterally without cough on insp or exp maneuvers   CV:  RRR  no s3 or murmur or increase in P2, and no edema   ABD:  soft and nontender with nl inspiratory excursion in the supine position. No bruits or organomegaly appreciated, bowel sounds nl  MS:  Nl gait/ ext warm without deformities, calf tenderness, cyanosis or clubbing No obvious joint restrictions   SKIN: warm and dry without lesions    NEURO:  alert, approp, nl sensorium with  no motor or cerebellar deficits apparent.    CXR PA and Lateral:   11/17/2017 :    I personally reviewed images and agree with radiology impression as follows:    ordered/ not done       Assessment   Upper airway cough syndrome Onset early 06/22/17  - FENO 11/17/2017  =   67   The most common causes of chronic cough in immunocompetent adults include the following: upper airway cough syndrome (UACS), previously referred to as postnasal drip syndrome (PNDS), which is caused by variety of rhinosinus conditions; (2) asthma; (3) GERD; (4) chronic bronchitis from cigarette smoking or other inhaled environmental irritants; (5) nonasthmatic eosinophilic bronchitis; and (6) bronchiectasis.   These conditions, singly or in combination, have accounted for up to 94% of the causes of chronic cough in prospective studies.   Other conditions have constituted no >6% of the causes in prospective studies These have included bronchogenic carcinoma, chronic interstitial pneumonia, sarcoidosis, left ventricular failure, ACEI-induced cough, and aspiration from a condition associated with pharyngeal dysfunction.    Chronic cough is often simultaneously caused by more than one condition. A single cause has been found from 38 to 82% of the time, multiple causes from 18 to 62%. Multiply caused cough has been the result of three diseases up to 42% of the time.       Cough at hs that does not exac in early am with feno < 50 is less c/w asthma and more suggestive of Upper airway  cough syndrome (previously labeled PNDS),  is so named because it's frequently impossible to sort out how much is  CR/sinusitis with freq throat clearing (which can be related to primary GERD)   vs  causing  secondary (" extra esophageal")  GERD from wide swings in gastric pressure that occur with throat clearing, often  promoting self use of mint and menthol lozenges that reduce the lower esophageal sphincter tone and exacerbate the problem further in a cyclical fashion.   These are the same pts (now being labeled as having "irritable larynx syndrome" by some cough centers) who not infrequently have a history of having failed to tolerate ace inhibitors,  dry powder inhalers or biphosphonates or report having atypical/extraesophageal reflux symptoms that don't respond to standard doses  of PPI  and are easily confused as having aecopd or asthma flares by even experienced allergists/ pulmonologists (myself included).    Of the three most common causes of  Sub-acute / recurrent or chronic cough, only one (GERD)  can actually contribute to/ trigger  the other two (asthma and post nasal drip syndrome)  and perpetuate the cylce of cough.  While not intuitively obvious, many patients with chronic low grade reflux do not cough until there is a primary insult that disturbs the protective epithelial barrier and exposes sensitive nerve endings.   This is typically viral but can due to PNDS and  either may apply here.     The point is that once this occurs, it is difficult to eliminate the cycle  using anything but a maximally effective acid suppression regimen at least in the short run, accompanied by an appropriate diet to address non acid GERD and control / eliminate the cough itself for at least a week with mucinex dm 1200 mg bid and addition of tramadol if persists and addition of 1st gen H1 blockers per guidelines  If sinus ct is neg (which has yet to be determined.   She is reluctant to take prednisone and  I'm fine with that but will give another 10 days augmentin until returns and do sinus ct if not better along with allergy profile     Total time devoted to counseling  > 50 % of initial 60 min office visit:  review case with pt/ discussion of options/alternatives/ personally creating written customized instructions  in presence of pt  then going over those specific  Instructions directly with the pt including how to use all of the meds but in particular covering each new medication in detail and the difference between the maintenance= "automatic" meds and the prns using an action plan format for the latter (If this problem/symptom => do that organization reading Left to right).  Please see AVS from this visit for a full list of these instructions which I personally wrote for this pt and  are unique to this visit.       Christinia Gully, MD 11/17/2017

## 2017-11-17 NOTE — Assessment & Plan Note (Addendum)
Onset early 06/22/17  - FENO 11/17/2017  =   33   The most common causes of chronic cough in immunocompetent adults include the following: upper airway cough syndrome (UACS), previously referred to as postnasal drip syndrome (PNDS), which is caused by variety of rhinosinus conditions; (2) asthma; (3) GERD; (4) chronic bronchitis from cigarette smoking or other inhaled environmental irritants; (5) nonasthmatic eosinophilic bronchitis; and (6) bronchiectasis.   These conditions, singly or in combination, have accounted for up to 94% of the causes of chronic cough in prospective studies.   Other conditions have constituted no >6% of the causes in prospective studies These have included bronchogenic carcinoma, chronic interstitial pneumonia, sarcoidosis, left ventricular failure, ACEI-induced cough, and aspiration from a condition associated with pharyngeal dysfunction.    Chronic cough is often simultaneously caused by more than one condition. A single cause has been found from 38 to 82% of the time, multiple causes from 18 to 62%. Multiply caused cough has been the result of three diseases up to 42% of the time.       Cough at hs that does not exac in early am with feno < 50 is less c/w asthma and more suggestive of Upper airway cough syndrome (previously labeled PNDS),  is so named because it's frequently impossible to sort out how much is  CR/sinusitis with freq throat clearing (which can be related to primary GERD)   vs  causing  secondary (" extra esophageal")  GERD from wide swings in gastric pressure that occur with throat clearing, often  promoting self use of mint and menthol lozenges that reduce the lower esophageal sphincter tone and exacerbate the problem further in a cyclical fashion.   These are the same pts (now being labeled as having "irritable larynx syndrome" by some cough centers) who not infrequently have a history of having failed to tolerate ace inhibitors,  dry powder inhalers or  biphosphonates or report having atypical/extraesophageal reflux symptoms that don't respond to standard doses of PPI  and are easily confused as having aecopd or asthma flares by even experienced allergists/ pulmonologists (myself included).    Of the three most common causes of  Sub-acute / recurrent or chronic cough, only one (GERD)  can actually contribute to/ trigger  the other two (asthma and post nasal drip syndrome)  and perpetuate the cylce of cough.  While not intuitively obvious, many patients with chronic low grade reflux do not cough until there is a primary insult that disturbs the protective epithelial barrier and exposes sensitive nerve endings.   This is typically viral but can due to PNDS and  either may apply here.     The point is that once this occurs, it is difficult to eliminate the cycle  using anything but a maximally effective acid suppression regimen at least in the short run, accompanied by an appropriate diet to address non acid GERD and control / eliminate the cough itself for at least a week with mucinex dm 1200 mg bid and addition of tramadol if persists and addition of 1st gen H1 blockers per guidelines  If sinus ct is neg (which has yet to be determined.   She is reluctant to take prednisone and I'm fine with that but will give another 10 days augmentin until returns and do sinus ct if not better along with allergy profile     Total time devoted to counseling  > 50 % of initial 60 min office visit:  review case with pt/ discussion  of options/alternatives/ personally creating written customized instructions  in presence of pt  then going over those specific  Instructions directly with the pt including how to use all of the meds but in particular covering each new medication in detail and the difference between the maintenance= "automatic" meds and the prns using an action plan format for the latter (If this problem/symptom => do that organization reading Left to right).   Please see AVS from this visit for a full list of these instructions which I personally wrote for this pt and  are unique to this visit.

## 2017-11-17 NOTE — Patient Instructions (Addendum)
First take mucinex dm 1200 mg every 12 hours until no cough at all x 3 days     Prednisone 10 mg take  4 each am x 2 days,   2 each am x 2 days,  1 each am x 2 days and stop (this is to eliminate allergies and inflammation from contributing to coughing)  Protonix (pantoprazole) Take 30-60 min before first meal of the day and ranitidine 150 mg  One at  bedtime  Until return   GERD (REFLUX)  is an extremely common cause of respiratory symptoms, many times with no significant heartburn at all.    It can be treated with medication, but also with lifestyle changes including avoidance of late meals, excessive alcohol, smoking cessation, and avoid fatty foods, chocolate, peppermint, colas, red wine, and acidic juices such as orange juice.  NO MINT OR MENTHOL PRODUCTS SO NO COUGH DROPS   USE HARD CANDY INSTEAD (jolley ranchers or Stover's or Lifesavers (all available in sugarless versions) NO OIL BASED VITAMINS - use powdered substitutes.  Augmentin 875 mg take one pill twice daily  X 10 days - take at breakfast and supper with large glass of water.  It would help reduce the usual side effects (diarrhea and yeast infections) if you ate cultured yogurt at lunch.   Please remember to go to the  x-ray department downstairs in the basement  for your tests - we will call you with the results when they are available.      Please schedule a follow up office visit in 4 weeks, sooner if needed - call me prior to the office visit to set up a ct sinus - did not go to cxr, will notify pt needs to be done but if not convenient can do at f/u

## 2017-12-07 NOTE — Telephone Encounter (Signed)
I have not heard anyting about this but fine to leave off or take pepcid 20 mg otc in its place between now and ov

## 2017-12-07 NOTE — Telephone Encounter (Signed)
Dr [name],  Pt is asking if she should continue ranitidine She heard about adverse side effects on the news  Please advise thanks

## 2017-12-15 ENCOUNTER — Encounter: Payer: Self-pay | Admitting: Internal Medicine

## 2017-12-15 ENCOUNTER — Ambulatory Visit: Payer: Medicare HMO | Admitting: Internal Medicine

## 2017-12-15 ENCOUNTER — Ambulatory Visit (INDEPENDENT_AMBULATORY_CARE_PROVIDER_SITE_OTHER): Payer: Medicare HMO | Admitting: Internal Medicine

## 2017-12-15 VITALS — BP 118/72 | HR 62 | Temp 98.7°F | Resp 16 | Ht 63.5 in | Wt 136.0 lb

## 2017-12-15 DIAGNOSIS — S91312A Laceration without foreign body, left foot, initial encounter: Secondary | ICD-10-CM | POA: Diagnosis not present

## 2017-12-15 DIAGNOSIS — Z23 Encounter for immunization: Secondary | ICD-10-CM | POA: Diagnosis not present

## 2017-12-15 NOTE — Progress Notes (Signed)
Subjective:    Patient ID: Linda [name], female    DOB: 01-05-1951, 67 y.o.   MRN: [id number]  HPI The patient is here for an acute visit.  She broke a glass a few weeks ago and that she got up all the glass, but last night she was walking on the floor and had something on the bottom of her right foot.  She scratched it on top of her left foot to get what ever it was off and it was a piece of small glass.  She cut the dorsal aspect of her left foot and wanted to have it evaluated, which is why she is here today.  She states the cut bled a lot initially.  She does have pain.  She denies any redness, discharge or swelling.  She is not having any numbness or tingling.  She denies fevers.  She is not sure if she needs stitches or any other treatment.     Medications and allergies reviewed with patient and updated if appropriate.  Patient Active Problem List   Diagnosis Date Noted  . Upper airway cough syndrome 11/17/2017  . Mid back pain on right side 08/14/2017  . Gastroesophageal reflux disease 07/02/2017  . Cough 07/02/2016  . History of colonic polyps 05/22/2014  . Hyperglycemia 09/04/2013  . Allergic rhinitis 08/06/2013  . ANXIETY STATE, UNSPECIFIED 11/29/2009  . Vitamin D deficiency 04/10/2008  . Hyperlipidemia 10/04/2007  . Osteoporosis 10/04/2007    Current Outpatient Medications on File Prior to Visit  Medication Sig Dispense Refill  . Ascorbic Acid (VITAMIN C) 1000 MG tablet Take 1,000 mg by mouth daily.      . Calcium Carb-Cholecalciferol (CALCIUM 1000 + D PO) Take by mouth daily.      . cholecalciferol (VITAMIN D) 1000 units tablet Take 1.5 tablets (1,500 Units total) by mouth daily. 90 tablet 2  . pantoprazole (PROTONIX) 40 MG tablet Take 1 tablet (40 mg total) by mouth daily. Take 30-60 min before first meal of the day 30 tablet 2   No current facility-administered medications on file prior to visit.     Past Medical History:  Diagnosis Date  . Cellulitis of  leg, right 08/27/2016  . Hyperlipidemia   . Osteopenia   . Vitamin D deficiency     Past Surgical History:  Procedure Laterality Date  . COLONOSCOPY  03/2011   Dr Brodie;diminuitive polyp  . G 2 P 2    . TONSILLECTOMY      Social History   Socioeconomic History  . Marital status: Married    Spouse name: Not on file  . Number of children: 2  . Years of education: Not on file  . Highest education level: Not on file  Occupational History  . Occupation: Science writer  Social Needs  . Financial resource strain: Not on file  . Food insecurity:    Worry: Not on file    Inability: Not on file  . Transportation needs:    Medical: Not on file    Non-medical: Not on file  Tobacco Use  . Smoking status: Never Smoker  . Smokeless tobacco: Never Used  Substance and Sexual Activity  . Alcohol use: Yes    Alcohol/week: 14.0 standard drinks    Types: 14 Glasses of wine per week    Comment: wine nightly  . Drug use: No  . Sexual activity: Not on file  Lifestyle  . Physical activity:    Days per week: Not on file  Minutes per session: Not on file  . Stress: Not on file  Relationships  . Social connections:    Talks on phone: Not on file    Gets together: Not on file    Attends religious service: Not on file    Active member of club or organization: Not on file    Attends meetings of clubs or organizations: Not on file    Relationship status: Not on file  Other Topics Concern  . Not on file  Social History Narrative   Exercise:  Yoga, weights, walking      Two children, grandchildren    Family History  Problem Relation Age of Onset  . Melanoma Father   . Hyperlipidemia Mother   . Heart disease Mother        no MI  . Diabetes Paternal Grandmother        vision loss  . Diabetes Maternal Uncle   . Colon cancer Neg Hx   . Stomach cancer Neg Hx   . Esophageal cancer Neg Hx   . Rectal cancer Neg Hx   . Stroke Neg Hx     Review of Systems Per HPI    Objective:    Vitals:   12/15/17 1428  BP: 118/72  Pulse: 62  Resp: 16  Temp: 98.7 F (37.1 C)  SpO2: 97%   BP Readings from Last 3 Encounters:  12/15/17 118/72  11/17/17 116/72  09/02/17 130/76   Wt Readings from Last 3 Encounters:  12/15/17 136 lb (61.7 kg)  11/17/17 135 lb (61.2 kg)  09/02/17 137 lb (62.1 kg)   Body mass index is 23.71 kg/m.   Physical Exam  Constitutional: She appears well-developed and well-nourished. No distress.  Cardiovascular: Intact distal pulses.  Neurological: No sensory deficit.  Skin: Skin is warm and dry. She is not diaphoretic.  Dorsal aspect of left foot, distal first metatarsal 1 cm laceration with edges aligned, no discharge or bleeding, no swelling or surrounding erythema.  Mild tenderness with palpation, no fluctuance           Assessment & Plan:    See Problem List for Assessment and Plan of chronic medical problems.

## 2017-12-15 NOTE — Patient Instructions (Signed)
You are up to date with your tetanus.   No sutures are needed.  Monitor closely for infection and call if you see any redness or pus.    Continue applying an antibacterial ointment and keep a Band-Aid on it.

## 2017-12-15 NOTE — Assessment & Plan Note (Signed)
Small laceration to the dorsal aspect of the left foot secondary to glass Laceration healing-edges aligned and closed without open wound, bleeding or discharge Mildly tender, superficial without evidence of deeper injury No sutures needed Local wound care with antibiotic ointment and Band-Aid only Avoid rigorous exercise for the next couple of days as wound with likely open Monitor for infection and call with any questions or concerns Tetanus up-to-date

## 2017-12-16 ENCOUNTER — Ambulatory Visit: Payer: Medicare HMO | Admitting: Family Medicine

## 2017-12-16 ENCOUNTER — Ambulatory Visit: Payer: Self-pay

## 2017-12-16 ENCOUNTER — Encounter: Payer: Self-pay | Admitting: Family Medicine

## 2017-12-16 VITALS — BP 110/70 | HR 73 | Ht 63.5 in | Wt 136.0 lb

## 2017-12-16 DIAGNOSIS — M25512 Pain in left shoulder: Secondary | ICD-10-CM

## 2017-12-16 DIAGNOSIS — G8929 Other chronic pain: Secondary | ICD-10-CM

## 2017-12-16 DIAGNOSIS — M533 Sacrococcygeal disorders, not elsewhere classified: Secondary | ICD-10-CM | POA: Diagnosis not present

## 2017-12-16 DIAGNOSIS — M810 Age-related osteoporosis without current pathological fracture: Secondary | ICD-10-CM

## 2017-12-16 DIAGNOSIS — M7502 Adhesive capsulitis of left shoulder: Secondary | ICD-10-CM | POA: Diagnosis not present

## 2017-12-16 DIAGNOSIS — M7500 Adhesive capsulitis of unspecified shoulder: Secondary | ICD-10-CM | POA: Insufficient documentation

## 2017-12-16 MED ORDER — VITAMIN D (ERGOCALCIFEROL) 1.25 MG (50000 UNIT) PO CAPS: 50000.0000 [IU] | ORAL_CAPSULE | ORAL | 0 refills | Status: DC

## 2017-12-16 NOTE — Progress Notes (Signed)
[name] [address] [place], [place] [address] Phone: [phone] Subjective:    I Linda [name] am serving as a Education administrator for Dr. Hulan Saas.   I'm seeing this patient by the request  of:  Binnie Rail, MD   CC: Back pain, left shoulder pain  YBW:[id number]  Linda [name] is a 67 y.o. female coming in with complaint of lower back and left shoulder pain. Has been seen at [facility] and been to PT and exercises. 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 States she does yoga and believes this is the cause of the back pain. Has decreased yoga. Decrease in ROM of shoulder. Has osteoperosis. Back pain previously radiated to the glut. But she has no glut pain now. No numbness and tingling noted in toes or fingers. No neck pain noted. Neck has limited ROM.  Onset- Chronic  Location- lower back and shoulder  Duration-  Character- Dull, Achy  Aggravating factors- Reliving factors-  Therapies tried-  Severity-7 out of 10 on both   Patient has seen other providers for this previously.  Patient feels that they have not done a thorough work-up.  Having increasing discomfort and pain overall.  Affect daily activities.  Past Medical History:  Diagnosis Date  . Cellulitis of leg, right 08/27/2016  . Hyperlipidemia   . Osteopenia   . Vitamin D deficiency    Past Surgical History:  Procedure Laterality Date  . COLONOSCOPY  03/2011   Dr Brodie;diminuitive polyp  . G 2 P 2    . TONSILLECTOMY     Social History   Socioeconomic History  . Marital status: Married    Spouse name: Not on file  . Number of children: 2  . Years of education: Not on file  . Highest education level: Not on file  Occupational History  . Occupation: Science writer  Social Needs  . Financial resource strain: Not on file  . Food insecurity:    Worry: Not on file    Inability: Not on file  . Transportation needs:    Medical: Not on file    Non-medical: Not on file    Tobacco Use  . Smoking status: Never Smoker  . Smokeless tobacco: Never Used  Substance and Sexual Activity  . Alcohol use: Yes    Alcohol/week: 14.0 standard drinks    Types: 14 Glasses of wine per week    Comment: wine nightly  . Drug use: No  . Sexual activity: Not on file  Lifestyle  . Physical activity:    Days per week: Not on file    Minutes per session: Not on file  . Stress: Not on file  Relationships  . Social connections:    Talks on phone: Not on file    Gets together: Not on file    Attends religious service: Not on file    Active member of club or organization: Not on file    Attends meetings of clubs or organizations: Not on file    Relationship status: Not on file  Other Topics Concern  . Not on file  Social History Narrative   Exercise:  Yoga, weights, walking      Two children, grandchildren   No Known Allergies Family History  Problem Relation Age of Onset  . Melanoma Father   . Hyperlipidemia Mother   . Heart disease Mother        no MI  . Diabetes Paternal Grandmother  vision loss  . Diabetes Maternal Uncle   . Colon cancer Neg Hx   . Stomach cancer Neg Hx   . Esophageal cancer Neg Hx   . Rectal cancer Neg Hx   . Stroke Neg Hx          Current Outpatient Medications (Other):  Marland Kitchen  Ascorbic Acid (VITAMIN C) 1000 MG tablet, Take 1,000 mg by mouth daily.   .  Calcium Carb-Cholecalciferol (CALCIUM 1000 + D PO), Take by mouth daily.   .  cholecalciferol (VITAMIN D) 1000 units tablet, Take 1.5 tablets (1,500 Units total) by mouth daily. .  Vitamin D, Ergocalciferol, (DRISDOL) 50000 units CAPS capsule, Take 1 capsule (50,000 Units total) by mouth every 7 (seven) days.    Past medical history, social, surgical and family history all reviewed in electronic medical record.  No pertanent information unless stated regarding to the chief complaint.   Review of Systems:  No headache, visual changes, nausea, vomiting, diarrhea, constipation,  dizziness, abdominal pain, skin rash, fevers, chills, night sweats, weight loss, swollen lymph nodes, body aches, joint swelling,  chest pain, shortness of breath, mood changes.  Positive muscle aches  Objective  Blood pressure 110/70, pulse 73, height 5' 3.5" (1.613 m), weight 136 lb (61.7 kg), SpO2 98 %.    General: No apparent distress alert and oriented x3 mood and affect normal, dressed appropriately.  HEENT: Pupils equal, extraocular movements intact  Respiratory: Patient's speak in full sentences and does not appear short of breath  Cardiovascular: No lower extremity edema, non tender, no erythema  Skin: Warm dry intact with no signs of infection or rash on extremities or on axial skeleton.  Abdomen: Soft nontender  Neuro: Cranial nerves II through XII are intact, neurovascularly intact in all extremities with 2+ DTRs and 2+ pulses.  Lymph: No lymphadenopathy of posterior or anterior cervical chain or axillae bilaterally.  Gait normal with good balance and coordination.  MSK:  Non tender with full range of motion and good stability and symmetric strength and tone of  elbows, wrist, hip, knee and ankles bilaterally.  Mild arthritic changes of multiple joints  Shoulder: Right Inspection reveals no abnormalities, atrophy or asymmetry. Palpation is normal with no tenderness over AC joint or bicipital groove. Decreased with forward flexion of 140 degrees, external rotation at 10 degrees, internal rotation to lateral hip Rotator cuff strength normal throughout. Positive impingement Speeds and Yergason's tests normal. No labral pathology noted with negative Obrien's, negative clunk and good stability. Normal scapular function observed. No painful arc and no drop arm sign. No apprehension sign Contralateral shoulder unremarkable  Back Exam:  Inspection: Loss of lordosis Motion: Flexion 45 deg, Extension 25 deg, Side Bending to 45 deg bilaterally, Rotation to 35 deg bilaterally  SLR  laying: Negative  XSLR laying: Negative  Palpable tenderness: Tender to palpation in bilateral sacroiliac. FABER: Positive bilaterally. Sensory change: Gross sensation intact to all lumbar and sacral dermatomes.  Reflexes: 2+ at both patellar tendons, 2+ at achilles tendons, Babinski's downgoing.  Strength at foot  Plantar-flexion: 5/5 Dorsi-flexion: 5/5 Eversion: 5/5 Inversion: 5/5  Leg strength  Quad: 5/5 Hamstring: 5/5 Hip flexor: 5/5 Hip abductors: 4/5 but symmetric Gait unremarkable.  MSK US performed of: Shoulder This study was ordered, performed, and interpreted by Charlann Boxer D.O.  Shoulder:   Supraspinatus:  Appears normal on long and transverse views some scarring from previous injury, no bursal bulge seen with shoulder abduction on impingement view.  Thickening of the capsule noted. Marland Kitchen  AC joint: Severe arthritic changes. Glenohumeral Joint:  Appears normal without effusion.  Thickening of the capsule with some calcific changes Glenoid Labrum:  Intact without visualized tears.  Calcific changes Biceps Tendon:  Appears normal on long and transverse views, no fraying of tendon, tendon located in intertubercular groove, no subluxation with shoulder internal or external rotation. No increased power doppler signal. 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 The above documentation has been reviewed and is accurate and complete Lyndal Pulley, DO       Note: This dictation was prepared with Dragon dictation along with smaller phrase technology. Any transcriptional errors that result from this process are unintentional.

## 2017-12-16 NOTE — Assessment & Plan Note (Signed)
Left side.  Patient has had a history of this previously on the contralateral side.  Patient has had an injection but felt he was very painful.  Patient has not made significant improvement at the time.  Patient rates the severity of pain is 7 out of 10.  Patient's encouraged to consider physical therapy or injections.  One sister with home exercises.  We discussed different range of motion exercises that I think will be beneficial.  Discussed icing regimen.  Follow-up with me again in 4 weeks

## 2017-12-16 NOTE — Assessment & Plan Note (Signed)
Encourage patient to consider bisphosphonate

## 2017-12-16 NOTE — Assessment & Plan Note (Signed)
Bilateral.  Discussed x-rays with patient's history of osteoporosis rule out any insufficiency fracture that could be contributing.  Started patient on once weekly vitamin D.  Encouraged to take this regularly.  Discussed the possibility of Prolia.  Home exercise given for core strengthening.  Follow-up again in 4 to 6 weeks

## 2017-12-16 NOTE — Patient Instructions (Addendum)
Good to see you  Frozen shoulder and low back pain  Ice is your friend Ice 20 minutes 2 times daily. Usually after activity and before bed. Exercises 3 times a week.  Alternate the shoulder and the back  Once weekly vitamin D for 12 weeks\ Consider prolia  Biking or elliptical could be better See em again in 4-6 weeks

## 2017-12-17 ENCOUNTER — Encounter: Payer: Self-pay | Admitting: Family Medicine

## 2018-01-01 ENCOUNTER — Encounter: Payer: Self-pay | Admitting: Internal Medicine

## 2018-01-01 ENCOUNTER — Ambulatory Visit (INDEPENDENT_AMBULATORY_CARE_PROVIDER_SITE_OTHER): Payer: Medicare HMO | Admitting: Internal Medicine

## 2018-01-01 VITALS — BP 122/74 | HR 62 | Temp 98.3°F | Resp 16 | Ht 63.5 in | Wt 135.2 lb

## 2018-01-01 DIAGNOSIS — T300 Burn of unspecified body region, unspecified degree: Secondary | ICD-10-CM

## 2018-01-01 NOTE — Patient Instructions (Signed)
Try cool compresses on your eyelid.  You can try Vaseline or a moisturizing cream.     Call if no improvement

## 2018-01-01 NOTE — Progress Notes (Signed)
Subjective:    Patient ID: Linda [name], female    DOB: [birth year], 67 y.o.   MRN: [id number]  HPI The patient is here for an acute visit.   She had her eyebrows waxed yesterday.  She had a new girl doing it and she mentions to that girl that she had sensitive eyes.  The girl put something on her eyelids just below the eyebrows.  She started to experience a burning sensation under the left eyebrow before she even put the wax on.  The girl wiped off whatever cream she had put on there.  She had her eyebrows waxed and when she got home she said that there is a red streak underneath her left eyebrow.  The area Dornell Grasmick and there has not been much improvement in the past 24 hours.  She did go back to the shop and the girl was not working but Dealer was unsure what she had used, but thought it may have been any hydrocortisone or steroid cream.  She denies any obvious swelling or itching-the area just Cleo Villamizar where the redness is.  She denies any eye pain or irritation inside the eye.  Medications and allergies reviewed with patient and updated if appropriate.  Patient Active Problem List   Diagnosis Date Noted  . 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 Foot laceration, left, initial encounter 12/15/2017  . Upper airway cough syndrome 11/17/2017  . Mid back pain on right side 08/14/2017  . Gastroesophageal reflux disease 07/02/2017  . Cough 07/02/2016  . History of colonic polyps 05/22/2014  . Hyperglycemia 09/04/2013  . Allergic rhinitis 08/06/2013  . ANXIETY STATE, UNSPECIFIED 11/29/2009  . Vitamin D deficiency 04/10/2008  . Hyperlipidemia 10/04/2007  . Osteoporosis 10/04/2007    Current Outpatient Medications on File Prior to Visit  Medication Sig Dispense Refill  . Ascorbic Acid (VITAMIN C) 1000 MG tablet Take 1,000 mg by mouth daily.      . Calcium Carb-Cholecalciferol (CALCIUM 1000 + D PO) Take by mouth daily.      . cholecalciferol  (VITAMIN D) 1000 units tablet Take 1.5 tablets (1,500 Units total) by mouth daily. 90 tablet 2  . Vitamin D, Ergocalciferol, (DRISDOL) 50000 units CAPS capsule Take 1 capsule (50,000 Units total) by mouth every 7 (seven) days. 12 capsule 0   No current facility-administered medications on file prior to visit.     Past Medical History:  Diagnosis Date  . Cellulitis of leg, right 08/27/2016  . Hyperlipidemia   . Osteopenia   . Vitamin D deficiency     Past Surgical History:  Procedure Laterality Date  . COLONOSCOPY  03/2011   Dr Brodie;diminuitive polyp  . G 2 P 2    . TONSILLECTOMY      Social History   Socioeconomic History  . Marital status: Married    Spouse name: Not on file  . Number of children: 2  . Years of education: Not on file  . Highest education level: Not on file  Occupational History  . Occupation: Science writer  Social Needs  . Financial resource strain: Not on file  . Food insecurity:    Worry: Not on file    Inability: Not on file  . Transportation needs:    Medical: Not on file    Non-medical: Not on file  Tobacco Use  . Smoking status: Never Smoker  . Smokeless tobacco: Never Used  Substance and Sexual Activity  . Alcohol use:  Yes    Alcohol/week: 14.0 standard drinks    Types: 14 Glasses of wine per week    Comment: wine nightly  . Drug use: No  . Sexual activity: Not on file  Lifestyle  . Physical activity:    Days per week: Not on file    Minutes per session: Not on file  . Stress: Not on file  Relationships  . Social connections:    Talks on phone: Not on file    Gets together: Not on file    Attends religious service: Not on file    Active member of club or organization: Not on file    Attends meetings of clubs or organizations: Not on file    Relationship status: Not on file  Other Topics Concern  . Not on file  Social History Narrative   Exercise:  Yoga, weights, walking      Two children, grandchildren    Family History    Problem Relation Age of Onset  . Melanoma Father   . Hyperlipidemia Mother   . Heart disease Mother        no MI  . Diabetes Paternal Grandmother        vision loss  . Diabetes Maternal Uncle   . Colon cancer Neg Hx   . Stomach cancer Neg Hx   . Esophageal cancer Neg Hx   . Rectal cancer Neg Hx   . Stroke Neg Hx     Review of Systems Per HPI    Objective:   Vitals:   01/01/18 1513  BP: 122/74  Pulse: 62  Resp: 16  Temp: 98.3 F (36.8 C)  SpO2: 97%   BP Readings from Last 3 Encounters:  01/01/18 122/74  12/16/17 110/70  12/15/17 118/72   Wt Readings from Last 3 Encounters:  01/01/18 135 lb 3.2 oz (61.3 kg)  12/16/17 136 lb (61.7 kg)  12/15/17 136 lb (61.7 kg)   Body mass index is 23.57 kg/m.   Physical Exam  Constitutional: She appears well-developed and well-nourished. No distress.  HENT:  Head: Normocephalic and atraumatic.  Eyes: Conjunctivae are normal. Right eye exhibits no discharge. Left eye exhibits no discharge. No scleral icterus.  Skin: Skin is warm and dry. She is not diaphoretic. There is erythema (Linear streak of redness less than 0.5 cm in width below left eyebrow-overlying brow bone-looks like a burn, no ulcer, blisters, open wound; no swelling; no redness or irritation just below eyebrow).           Assessment & Plan:    See Problem List for Assessment and Plan of chronic medical problems.

## 2018-01-01 NOTE — Assessment & Plan Note (Signed)
The area of irritation under her left eyebrow looks like a mild skin burn She will try to find out what was applied to that area No evidence of infection Does not look like dermatitis She will apply cool compresses She can also try cocoa butter, Vaseline or moisturizing lotion She will let me know if there is no improvement or any worsening

## 2018-03-02 ENCOUNTER — Other Ambulatory Visit: Payer: Self-pay | Admitting: Family Medicine

## 2018-03-02 NOTE — Telephone Encounter (Signed)
Refill done.  

## 2018-03-11 ENCOUNTER — Ambulatory Visit (INDEPENDENT_AMBULATORY_CARE_PROVIDER_SITE_OTHER): Payer: Medicare HMO | Admitting: Family Medicine

## 2018-03-11 ENCOUNTER — Encounter: Payer: Self-pay | Admitting: Family Medicine

## 2018-03-11 VITALS — BP 114/82 | HR 86 | Temp 98.6°F | Ht 63.5 in | Wt 140.0 lb

## 2018-03-11 DIAGNOSIS — B349 Viral infection, unspecified: Secondary | ICD-10-CM

## 2018-03-11 DIAGNOSIS — R6889 Other general symptoms and signs: Secondary | ICD-10-CM | POA: Diagnosis not present

## 2018-03-11 LAB — POC INFLUENZA A&B (BINAX/QUICKVUE)
Influenza A, POC: NEGATIVE
Influenza B, POC: NEGATIVE

## 2018-03-11 NOTE — Addendum Note (Signed)
Addended by: Marcina Millard on: 03/11/2018 11:51 AM   Modules accepted: Orders

## 2018-03-11 NOTE — Progress Notes (Signed)
Patient ID: Linda [name], female   DOB: 18-Nov-1950, 67 y.o.   MRN: [id number]  PCP: Binnie Rail, MD  Subjective:  Linda [name] is a 67 y.o. year old very pleasant female patient who presents with flu like symptoms including fever with Tmax of 100.7 ,body aches that have improved.  -other symptoms: fever, mild fatigue, nasal congestion -started: one day ago -inside 48 hour treatment window if needed for tamiflu: yes -high risk condition (children <5, adults >65, chronic pulmonary or cardiac condition, immunosuppression, pregnancy, nursing home resident, morbid obesity) : Age 67 -symptoms are improving -previous treatments: ibuprofen has provided benefit - patient did receive flu shot this year.  - Recent sick contact with 58 month old grandchild; specifically influenza: no  ROS-denies SOB, NVD, sinus or dental pain  Pertinent Past Medical History-  Patient Active Problem List   Diagnosis Date Noted  . Skin burn 01/01/2018  . 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 Foot laceration, left, initial encounter 12/15/2017  . Upper airway cough syndrome 11/17/2017  . Mid back pain on right side 08/14/2017  . Gastroesophageal reflux disease 07/02/2017  . Cough 07/02/2016  . History of colonic polyps 05/22/2014  . Hyperglycemia 09/04/2013  . Allergic rhinitis 08/06/2013  . ANXIETY STATE, UNSPECIFIED 11/29/2009  . Vitamin D deficiency 04/10/2008  . Hyperlipidemia 10/04/2007  . Osteoporosis 10/04/2007    Medications- reviewed  Current Outpatient Medications  Medication Sig Dispense Refill  . Ascorbic Acid (VITAMIN C) 1000 MG tablet Take 1,000 mg by mouth daily.      . Calcium Carb-Cholecalciferol (CALCIUM 1000 + D PO) Take by mouth daily.      . cholecalciferol (VITAMIN D) 1000 units tablet Take 1.5 tablets (1,500 Units total) by mouth daily. 90 tablet 2  . Vitamin D, Ergocalciferol, (DRISDOL) 1.25 MG (50000 UT) CAPS capsule TAKE 1 CAPSULE BY  MOUTH EVERY 7 DAYS 12 capsule 0   No current facility-administered medications for this visit.     Objective: BP 114/82 (BP Location: Left Arm, Patient Position: Sitting, Cuff Size: Normal)   Pulse 86   Temp 98.6 F (37 C) (Oral)   Ht 5' 3.5" (1.613 m)   Wt 140 lb (63.5 kg)   SpO2 99%   BMI 24.41 kg/m  Gen: NAD,  HEENT: Turbinates mildly erythematous, TMs normal bilaterally, oropharynx is clear and moist; no exudate or edema, no sinus tenderness CV: RRR no murmurs rubs or gallops Lungs: CTAB no crackles, wheeze, rhonchi Abdomen: soft/nontender/nondistended/normal bowel sounds. Ext: no edema Skin: warm, dry, no rash  Assessment/Plan: 1. Viral illness POC influenza negative; symptoms are improving.  Advised patient on supportive measures:  Get rest, drink plenty of fluids, and use tylenol or ibuprofen as needed for pain. Mucinex and nasal saline rinses discussed and recommended.  Follow up if fever >101, if symptoms worsen or if symptoms are not improved in 3 days. Patient verbalizes understanding.    Finally, we reviewed reasons to return to care including if symptoms worsen or persist or new concerns arise.   Laurita Quint, FNP

## 2018-03-11 NOTE — Patient Instructions (Signed)
You can use Mucinex for symptoms and consider a nasal saline rinse that can be beneficial. Mucinex DM can be used for cough as needed.  Your symptoms are most likely related to a viral illness. Please drink plenty of water so that your urine is pale yellow or clear. Also, get plenty of rest, use tylenol or ibuprofen as needed for discomfort and follow up if symptoms do not improve in 3 to 4 days, worsen, or you develop a fever >101.  Viral Illness, Adult Viruses are tiny germs that can get into a person's body and cause illness. There are many different types of viruses, and they cause many types of illness. Viral illnesses can range from mild to severe. They can affect various parts of the body. Common illnesses that are caused by a virus include colds and the flu. Viral illnesses also include serious conditions such as HIV/AIDS (human immunodeficiency virus/acquired immunodeficiency syndrome). A few viruses have been linked to certain cancers. What are the causes? Many types of viruses can cause illness. Viruses invade cells in your body, multiply, and cause the infected cells to malfunction or die. When the cell dies, it releases more of the virus. When this happens, you develop symptoms of the illness, and the virus continues to spread to other cells. If the virus takes over the function of the cell, it can cause the cell to divide and grow out of control, as is the case when a virus causes cancer. Different viruses get into the body in different ways. You can get a virus by:  Swallowing food or water that is contaminated with the virus.  Breathing in droplets that have been coughed or sneezed into the air by an infected person.  Touching a surface that has been contaminated with the virus and then touching your eyes, nose, or mouth.  Being bitten by an insect or animal that carries the virus.  Having sexual contact with a person who is infected with the virus.  Being exposed to blood or  fluids that contain the virus, either through an open cut or during a transfusion. If a virus enters your body, your body's defense system (immune system) will try to fight the virus. You may be at higher risk for a viral illness if your immune system is weak. What are the signs or symptoms? Symptoms vary depending on the type of virus and the location of the cells that it invades. Common symptoms of the main types of viral illnesses include: Cold and flu viruses  Fever.  Headache.  Sore throat.  Muscle aches.  Nasal congestion.  Cough. Digestive system (gastrointestinal) viruses  Fever.  Abdominal pain.  Nausea.  Diarrhea. Liver viruses (hepatitis)  Loss of appetite.  Tiredness.  Yellowing of the skin (jaundice). Brain and spinal cord viruses  Fever.  Headache.  Stiff neck.  Nausea and vomiting.  Confusion or sleepiness. Skin viruses  Warts.  Itching.  Rash. Sexually transmitted viruses  Discharge.  Swelling.  Redness.  Rash. How is this treated? Viruses can be difficult to treat because they live within cells. Antibiotic medicines do not treat viruses because these drugs do not get inside cells. Treatment for a viral illness may include:  Resting and drinking plenty of fluids.  Medicines to relieve symptoms. These can include over-the-counter medicine for pain and fever, medicines for cough or congestion, and medicines to relieve diarrhea.  Antiviral medicines. These drugs are available only for certain types of viruses. They may help reduce flu symptoms  if taken early. There are also many antiviral medicines for hepatitis and HIV/AIDS. Some viral illnesses can be prevented with vaccinations. A common example is the flu shot. Follow these instructions at home: Medicines   Take over-the-counter and prescription medicines only as told by your health care provider.  If you were prescribed an antiviral medicine, take it as told by your health  care provider. Do not stop taking the medicine even if you start to feel better.  Be aware of when antibiotics are needed and when they are not needed. Antibiotics do not treat viruses. If your health care provider thinks that you may have a bacterial infection as well as a viral infection, you may get an antibiotic. ? Do not ask for an antibiotic prescription if you have been diagnosed with a viral illness. That will not make your illness go away faster. ? Frequently taking antibiotics when they are not needed can lead to antibiotic resistance. When this develops, the medicine no longer works against the bacteria that it normally fights. General instructions  Drink enough fluids to keep your urine clear or pale yellow.  Rest as much as possible.  Return to your normal activities as told by your health care provider. Ask your health care provider what activities are safe for you.  Keep all follow-up visits as told by your health care provider. This is important. How is this prevented? Take these actions to reduce your risk of viral infection:  Eat a healthy diet and get enough rest.  Wash your hands often with soap and water. This is especially important when you are in public places. If soap and water are not available, use hand sanitizer.  Avoid close contact with friends and family who have a viral illness.  If you travel to areas where viral gastrointestinal infection is common, avoid drinking water or eating raw food.  Keep your immunizations up to date. Get a flu shot every year as told by your health care provider.  Do not share toothbrushes, nail clippers, razors, or needles with other people.  Always practice safe sex.  Contact a health care provider if:  You have symptoms of a viral illness that do not go away.  Your symptoms come back after going away.  Your symptoms get worse. Get help right away if:  You have trouble breathing.  You have a severe headache or a  stiff neck.  You have severe vomiting or abdominal pain. This information is not intended to replace advice given to you by your health care provider. Make sure you discuss any questions you have with your health care provider. Document Released: 07/20/2015 Document Revised: 08/22/2015 Document Reviewed: 07/20/2015 Elsevier Interactive Patient Education  2019 [employer].

## 2018-03-25 ENCOUNTER — Ambulatory Visit (INDEPENDENT_AMBULATORY_CARE_PROVIDER_SITE_OTHER): Payer: Medicare HMO | Admitting: Family Medicine

## 2018-03-25 ENCOUNTER — Encounter: Payer: Self-pay | Admitting: Family Medicine

## 2018-03-25 VITALS — BP 120/78 | HR 68 | Temp 98.3°F | Ht 63.5 in | Wt 141.1 lb

## 2018-03-25 DIAGNOSIS — J989 Respiratory disorder, unspecified: Secondary | ICD-10-CM

## 2018-03-25 DIAGNOSIS — J329 Chronic sinusitis, unspecified: Secondary | ICD-10-CM

## 2018-03-25 MED ORDER — BENZONATATE 100 MG PO CAPS: 100.0000 mg | ORAL_CAPSULE | Freq: Three times a day (TID) | ORAL | 0 refills | Status: DC | PRN

## 2018-03-25 MED ORDER — DOXYCYCLINE HYCLATE 100 MG PO TABS: 100.0000 mg | ORAL_TABLET | Freq: Two times a day (BID) | ORAL | 0 refills | Status: DC

## 2018-03-25 NOTE — Patient Instructions (Signed)
Nasal saline flush 2-3 times daily for 3-5 days  Afrin nasal spray twice daily for 3 days only. DO NOT use longer then 3 days.  Start the doxycycline and take as instructed if any worsening or not improving over the next 1-2 days.  Tessalon as needed for cough per instructions. Keep out of reach of children.  Benzonatate capsules What is this medicine? BENZONATATE (ben ZOE na tate) is used to treat cough. This medicine may be used for other purposes; ask your health care provider or pharmacist if you have questions. COMMON BRAND NAME(S): Tessalon Perles, Zonatuss What should I tell my health care provider before I take this medicine? They need to know if you have any of these conditions: -kidney or liver disease -an unusual or allergic reaction to benzonatate, anesthetics, other medicines, foods, dyes, or preservatives -pregnant or trying to get pregnant -breast-feeding How should I use this medicine? Take this medicine by mouth with a glass of water. Follow the directions on the prescription label. Avoid breaking, chewing, or sucking the capsule, as this can cause serious side effects. Take your medicine at regular intervals. Do not take your medicine more often than directed. Talk to your pediatrician regarding the use of this medicine in children. While this drug may be prescribed for children as young as 19 years old for selected conditions, precautions do apply. Overdosage: If you think you have taken too much of this medicine contact a poison control center or emergency room at once. NOTE: This medicine is only for you. Do not share this medicine with others. What if I miss a dose? If you miss a dose, take it as soon as you can. If it is almost time for your next dose, take only that dose. Do not take double or extra doses. What may interact with this medicine? Do not take this medicine with any of the following medications: -MAOIs like Carbex, Eldepryl, Marplan, Nardil, and  Parnate This list may not describe all possible interactions. Give your health care provider a list of all the medicines, herbs, non-prescription drugs, or dietary supplements you use. Also tell them if you smoke, drink alcohol, or use illegal drugs. Some items may interact with your medicine. What should I watch for while using this medicine? Tell your doctor if your symptoms do not improve or if they get worse. If you have a high fever, skin rash, or headache, see your health care professional. You may get drowsy or dizzy. Do not drive, use machinery, or do anything that needs mental alertness until you know how this medicine affects you. Do not sit or stand up quickly, especially if you are an older patient. This reduces the risk of dizzy or fainting spells. What side effects may I notice from receiving this medicine? Side effects that you should report to your doctor or health care professional as soon as possible: -allergic reactions like skin rash, itching or hives, swelling of the face, lips, or tongue -breathing problems -chest pain -confusion or hallucinations -irregular heartbeat -numbness of mouth or throat -seizures Side effects that usually do not require medical attention (report to your doctor or health care professional if they continue or are bothersome): -burning feeling in the eyes -constipation -headache -nasal congestion -stomach upset This list may not describe all possible side effects. Call your doctor for medical advice about side effects. You may report side effects to FDA at 1-800-FDA-1088. Where should I keep my medicine? Keep out of the reach of children. Store at  room temperature between 15 and 30 degrees C (59 and 86 degrees F). Keep tightly closed. Protect from light and moisture. Throw away any unused medicine after the expiration date. NOTE: This sheet is a summary. It may not cover all possible information. If you have questions about this medicine, talk to  your doctor, pharmacist, or health care provider.  2019 Elsevier/Gold Standard (2007-06-09 14:52:56)   Doxycycline tablets or capsules What is this medicine? DOXYCYCLINE (dox i SYE kleen) is a tetracycline antibiotic. It kills certain bacteria or stops their growth. It is used to treat many kinds of infections, like dental, skin, respiratory, and urinary tract infections. It also treats acne, Lyme disease, malaria, and certain sexually transmitted infections. This medicine may be used for other purposes; ask your health care provider or pharmacist if you have questions. COMMON BRAND NAME(S): Acticlate, Adoxa, Adoxa CK, Adoxa Pak, Adoxa TT, Alodox, Avidoxy, Doxal, LYMEPAK, Mondoxyne NL, Monodox, Morgidox 1x, Morgidox 1x Kit, Morgidox 2x, Morgidox 2x Kit, NutriDox, Ocudox, TARGADOX, Vibra-Tabs, Vibramycin What should I tell my health care provider before I take this medicine? They need to know if you have any of these conditions: -liver disease -long exposure to sunlight like working outdoors -stomach problems like colitis -an unusual or allergic reaction to doxycycline, tetracycline antibiotics, other medicines, foods, dyes, or preservatives -pregnant or trying to get pregnant -breast-feeding How should I use this medicine? Take this medicine by mouth with a full glass of water. Follow the directions on the prescription label. It is best to take this medicine without food, but if it upsets your stomach take it with food. Take your medicine at regular intervals. Do not take your medicine more often than directed. Take all of your medicine as directed even if you think you are better. Do not skip doses or stop your medicine early. Talk to your pediatrician regarding the use of this medicine in children. While this drug may be prescribed for selected conditions, precautions do apply. Overdosage: If you think you have taken too much of this medicine contact a poison control center or emergency room  at once. NOTE: This medicine is only for you. Do not share this medicine with others. What if I miss a dose? If you miss a dose, take it as soon as you can. If it is almost time for your next dose, take only that dose. Do not take double or extra doses. What may interact with this medicine? -antacids -barbiturates -birth control pills -bismuth subsalicylate -carbamazepine -methoxyflurane -other antibiotics -phenytoin -vitamins that contain iron -warfarin This list may not describe all possible interactions. Give your health care provider a list of all the medicines, herbs, non-prescription drugs, or dietary supplements you use. Also tell them if you smoke, drink alcohol, or use illegal drugs. Some items may interact with your medicine. What should I watch for while using this medicine? Tell your doctor or health care professional if your symptoms do not improve. Do not treat diarrhea with over the counter products. Contact your doctor if you have diarrhea that lasts more than 2 days or if it is severe and watery. Do not take this medicine just before going to bed. It may not dissolve properly when you lay down and can cause pain in your throat. Drink plenty of fluids while taking this medicine to also help reduce irritation in your throat. This medicine can make you more sensitive to the sun. Keep out of the sun. If you cannot avoid being in the sun, wear protective  clothing and use sunscreen. Do not use sun lamps or tanning beds/booths. Birth control pills may not work properly while you are taking this medicine. Talk to your doctor about using an extra method of birth control. If you are being treated for a sexually transmitted infection, avoid sexual contact until you have finished your treatment. Your sexual partner may also need treatment. Avoid antacids, aluminum, calcium, magnesium, and iron products for 4 hours before and 2 hours after taking a dose of this medicine. If you are using  this medicine to prevent malaria, you should still protect yourself from contact with mosquitos. Stay in screened-in areas, use mosquito nets, keep your body covered, and use an insect repellent. What side effects may I notice from receiving this medicine? Side effects that you should report to your doctor or health care professional as soon as possible: -allergic reactions like skin rash, itching or hives, swelling of the face, lips, or tongue -difficulty breathing -fever -itching in the rectal or genital area -pain on swallowing -redness, blistering, peeling or loosening of the skin, including inside the mouth -severe stomach pain or cramps -unusual bleeding or bruising -unusually weak or tired -yellowing of the eyes or skin Side effects that usually do not require medical attention (report to your doctor or health care professional if they continue or are bothersome): -diarrhea -loss of appetite -nausea, vomiting This list may not describe all possible side effects. Call your doctor for medical advice about side effects. You may report side effects to FDA at 1-800-FDA-1088. Where should I keep my medicine? Keep out of the reach of children. Store at room temperature, below 30 degrees C (86 degrees F). Protect from light. Keep container tightly closed. Throw away any unused medicine after the expiration date. Taking this medicine after the expiration date can make you seriously ill. NOTE: This sheet is a summary. It may not cover all possible information. If you have questions about this medicine, talk to your doctor, pharmacist, or health care provider.  2019 Elsevier/Gold Standard (2015-04-11 17:11:22)

## 2018-03-25 NOTE — Progress Notes (Signed)
HPI:  Using dictation device. Unfortunately this device frequently misinterprets words/phrases.   Acute visit for respiratory illness: -started: influenzal like illness 12/19 - continued nasal congestion, pnd and cough since and now with frontal sinus pain today -denies:fever, SOB, NVD, tooth pain -has tried: no abx, occ saline -sick contacts/travel/risks: no reported flu, strep or tick exposure  ROS: See pertinent positives and negatives per HPI.  Past Medical History:  Diagnosis Date  . Cellulitis of leg, right 08/27/2016  . Hyperlipidemia   . Osteopenia   . Vitamin D deficiency     Past Surgical History:  Procedure Laterality Date  . COLONOSCOPY  03/2011   Dr Brodie;diminuitive polyp  . G 2 P 2    . TONSILLECTOMY      Family History  Problem Relation Age of Onset  . Melanoma Father   . Hyperlipidemia Mother   . Heart disease Mother        no MI  . Diabetes Paternal Grandmother        vision loss  . Diabetes Maternal Uncle   . Colon cancer Neg Hx   . Stomach cancer Neg Hx   . Esophageal cancer Neg Hx   . Rectal cancer Neg Hx   . Stroke Neg Hx     Social History   Socioeconomic History  . Marital status: Married    Spouse name: Not on file  . Number of children: 2  . Years of education: Not on file  . Highest education level: Not on file  Occupational History  . Occupation: Science writer  Social Needs  . Financial resource strain: Not on file  . Food insecurity:    Worry: Not on file    Inability: Not on file  . Transportation needs:    Medical: Not on file    Non-medical: Not on file  Tobacco Use  . Smoking status: Never Smoker  . Smokeless tobacco: Never Used  Substance and Sexual Activity  . Alcohol use: Yes    Alcohol/week: 14.0 standard drinks    Types: 14 Glasses of wine per week    Comment: wine nightly  . Drug use: No  . Sexual activity: Not on file  Lifestyle  . Physical activity:    Days per week: Not on file    Minutes per session:  Not on file  . Stress: Not on file  Relationships  . Social connections:    Talks on phone: Not on file    Gets together: Not on file    Attends religious service: Not on file    Active member of club or organization: Not on file    Attends meetings of clubs or organizations: Not on file    Relationship status: Not on file  Other Topics Concern  . Not on file  Social History Narrative   Exercise:  Yoga, weights, walking      Two children, grandchildren     Current Outpatient Medications:  .  Ascorbic Acid (VITAMIN C) 1000 MG tablet, Take 1,000 mg by mouth daily.  , Disp: , Rfl:  .  Calcium Carb-Cholecalciferol (CALCIUM 1000 + D PO), Take by mouth daily.  , Disp: , Rfl:  .  cholecalciferol (VITAMIN D) 1000 units tablet, Take 1.5 tablets (1,500 Units total) by mouth daily., Disp: 90 tablet, Rfl: 2 .  Vitamin D, Ergocalciferol, (DRISDOL) 1.25 MG (50000 UT) CAPS capsule, TAKE 1 CAPSULE BY MOUTH EVERY 7 DAYS, Disp: 12 capsule, Rfl: 0 .  benzonatate (TESSALON PERLES) 100  MG capsule, Take 1 capsule (100 mg total) by mouth 3 (three) times daily as needed., Disp: 20 capsule, Rfl: 0 .  doxycycline (VIBRA-TABS) 100 MG tablet, Take 1 tablet (100 mg total) by mouth 2 (two) times daily., Disp: 20 tablet, Rfl: 0  EXAM:  Vitals:   03/25/18 1341  BP: 120/78  Pulse: 68  Temp: 98.3 F (36.8 C)  SpO2: 96%    Body mass index is 24.6 kg/m.  GENERAL: vitals reviewed and listed above, alert, oriented, appears well hydrated and in no acute distress  HEENT: atraumatic, conjunttiva clear, no obvious abnormalities on inspection of external nose and ears, normal appearance of ear canals and TMs, thick nasal congestion, mild post oropharyngeal erythema with PND, no tonsillar edema or exudate, no sinus TTP  NECK: no obvious masses on inspection  LUNGS: clear to auscultation bilaterally, no wheezes, rales or rhonchi, good air movement  CV: HRRR, no peripheral edema  MS: moves all extremities  without noticeable abnormality  PSYCH: pleasant and cooperative, no obvious depression or anxiety  ASSESSMENT AND PLAN:  Discussed the following assessment and plan:  Respiratory illness  Rhinosinusitis  -We discussed potential etiologies, with VURI leading to possible sinusitis vs other. We discussed treatment side effects, likely course, antibiotic misuse, transmission, and signs of developing a serious illness. -opted for nasal saline, short course nasal decongestant, doxycycline if any worsening or not improving over the next 1-2 days -of course, we advised to return or notify a doctor immediately if symptoms worsen or persist or new concerns arise.    Patient Instructions  Nasal saline flush 2-3 times daily for 3-5 days  Afrin nasal spray twice daily for 3 days only. DO NOT use longer then 3 days.  Start the doxycycline and take as instructed if any worsening or not improving over the next 1-2 days.  Tessalon as needed for cough per instructions. Keep out of reach of children.  Benzonatate capsules What is this medicine? BENZONATATE (ben ZOE na tate) is used to treat cough. This medicine may be used for other purposes; ask your health care provider or pharmacist if you have questions. COMMON BRAND NAME(S): Tessalon Perles, Zonatuss What should I tell my health care provider before I take this medicine? They need to know if you have any of these conditions: -kidney or liver disease -an unusual or allergic reaction to benzonatate, anesthetics, other medicines, foods, dyes, or preservatives -pregnant or trying to get pregnant -breast-feeding How should I use this medicine? Take this medicine by mouth with a glass of water. Follow the directions on the prescription label. Avoid breaking, chewing, or sucking the capsule, as this can cause serious side effects. Take your medicine at regular intervals. Do not take your medicine more often than directed. Talk to your pediatrician  regarding the use of this medicine in children. While this drug may be prescribed for children as young as 80 years old for selected conditions, precautions do apply. Overdosage: If you think you have taken too much of this medicine contact a poison control center or emergency room at once. NOTE: This medicine is only for you. Do not share this medicine with others. What if I miss a dose? If you miss a dose, take it as soon as you can. If it is almost time for your next dose, take only that dose. Do not take double or extra doses. What may interact with this medicine? Do not take this medicine with any of the following medications: -MAOIs like Carbex,  Eldepryl, Marplan, Nardil, and Parnate This list may not describe all possible interactions. Give your health care provider a list of all the medicines, herbs, non-prescription drugs, or dietary supplements you use. Also tell them if you smoke, drink alcohol, or use illegal drugs. Some items may interact with your medicine. What should I watch for while using this medicine? Tell your doctor if your symptoms do not improve or if they get worse. If you have a high fever, skin rash, or headache, see your health care professional. You may get drowsy or dizzy. Do not drive, use machinery, or do anything that needs mental alertness until you know how this medicine affects you. Do not sit or stand up quickly, especially if you are an older patient. This reduces the risk of dizzy or fainting spells. What side effects may I notice from receiving this medicine? Side effects that you should report to your doctor or health care professional as soon as possible: -allergic reactions like skin rash, itching or hives, swelling of the face, lips, or tongue -breathing problems -chest pain -confusion or hallucinations -irregular heartbeat -numbness of mouth or throat -seizures Side effects that usually do not require medical attention (report to your doctor or health  care professional if they continue or are bothersome): -burning feeling in the eyes -constipation -headache -nasal congestion -stomach upset This list may not describe all possible side effects. Call your doctor for medical advice about side effects. You may report side effects to FDA at 1-800-FDA-1088. Where should I keep my medicine? Keep out of the reach of children. Store at room temperature between 15 and 30 degrees C (59 and 86 degrees F). Keep tightly closed. Protect from light and moisture. Throw away any unused medicine after the expiration date. NOTE: This sheet is a summary. It may not cover all possible information. If you have questions about this medicine, talk to your doctor, pharmacist, or health care provider.  2019 Elsevier/Gold Standard (2007-06-09 14:52:56)   Doxycycline tablets or capsules What is this medicine? DOXYCYCLINE (dox i SYE kleen) is a tetracycline antibiotic. It kills certain bacteria or stops their growth. It is used to treat many kinds of infections, like dental, skin, respiratory, and urinary tract infections. It also treats acne, Lyme disease, malaria, and certain sexually transmitted infections. This medicine may be used for other purposes; ask your health care provider or pharmacist if you have questions. COMMON BRAND NAME(S): Acticlate, Adoxa, Adoxa CK, Adoxa Pak, Adoxa TT, Alodox, Avidoxy, Doxal, LYMEPAK, Mondoxyne NL, Monodox, Morgidox 1x, Morgidox 1x Kit, Morgidox 2x, Morgidox 2x Kit, NutriDox, Ocudox, TARGADOX, Vibra-Tabs, Vibramycin What should I tell my health care provider before I take this medicine? They need to know if you have any of these conditions: -liver disease -long exposure to sunlight like working outdoors -stomach problems like colitis -an unusual or allergic reaction to doxycycline, tetracycline antibiotics, other medicines, foods, dyes, or preservatives -pregnant or trying to get pregnant -breast-feeding How should I use this  medicine? Take this medicine by mouth with a full glass of water. Follow the directions on the prescription label. It is best to take this medicine without food, but if it upsets your stomach take it with food. Take your medicine at regular intervals. Do not take your medicine more often than directed. Take all of your medicine as directed even if you think you are better. Do not skip doses or stop your medicine early. Talk to your pediatrician regarding the use of this medicine in children. While this  drug may be prescribed for selected conditions, precautions do apply. Overdosage: If you think you have taken too much of this medicine contact a poison control center or emergency room at once. NOTE: This medicine is only for you. Do not share this medicine with others. What if I miss a dose? If you miss a dose, take it as soon as you can. If it is almost time for your next dose, take only that dose. Do not take double or extra doses. What may interact with this medicine? -antacids -barbiturates -birth control pills -bismuth subsalicylate -carbamazepine -methoxyflurane -other antibiotics -phenytoin -vitamins that contain iron -warfarin This list may not describe all possible interactions. Give your health care provider a list of all the medicines, herbs, non-prescription drugs, or dietary supplements you use. Also tell them if you smoke, drink alcohol, or use illegal drugs. Some items may interact with your medicine. What should I watch for while using this medicine? Tell your doctor or health care professional if your symptoms do not improve. Do not treat diarrhea with over the counter products. Contact your doctor if you have diarrhea that lasts more than 2 days or if it is severe and watery. Do not take this medicine just before going to bed. It may not dissolve properly when you lay down and can cause pain in your throat. Drink plenty of fluids while taking this medicine to also help reduce  irritation in your throat. This medicine can make you more sensitive to the sun. Keep out of the sun. If you cannot avoid being in the sun, wear protective clothing and use sunscreen. Do not use sun lamps or tanning beds/booths. Birth control pills may not work properly while you are taking this medicine. Talk to your doctor about using an extra method of birth control. If you are being treated for a sexually transmitted infection, avoid sexual contact until you have finished your treatment. Your sexual partner may also need treatment. Avoid antacids, aluminum, calcium, magnesium, and iron products for 4 hours before and 2 hours after taking a dose of this medicine. If you are using this medicine to prevent malaria, you should still protect yourself from contact with mosquitos. Stay in screened-in areas, use mosquito nets, keep your body covered, and use an insect repellent. What side effects may I notice from receiving this medicine? Side effects that you should report to your doctor or health care professional as soon as possible: -allergic reactions like skin rash, itching or hives, swelling of the face, lips, or tongue -difficulty breathing -fever -itching in the rectal or genital area -pain on swallowing -redness, blistering, peeling or loosening of the skin, including inside the mouth -severe stomach pain or cramps -unusual bleeding or bruising -unusually weak or tired -yellowing of the eyes or skin Side effects that usually do not require medical attention (report to your doctor or health care professional if they continue or are bothersome): -diarrhea -loss of appetite -nausea, vomiting This list may not describe all possible side effects. Call your doctor for medical advice about side effects. You may report side effects to FDA at 1-800-FDA-1088. Where should I keep my medicine? Keep out of the reach of children. Store at room temperature, below 30 degrees C (86 degrees F). Protect  from light. Keep container tightly closed. Throw away any unused medicine after the expiration date. Taking this medicine after the expiration date can make you seriously ill. NOTE: This sheet is a summary. It may not cover all possible information. If  you have questions about this medicine, talk to your doctor, pharmacist, or health care provider.  2019 Elsevier/Gold Standard (2015-04-11 17:11:22)    Lucretia Kern, DO

## 2018-03-29 ENCOUNTER — Encounter: Payer: Self-pay | Admitting: Internal Medicine

## 2018-03-29 ENCOUNTER — Ambulatory Visit (INDEPENDENT_AMBULATORY_CARE_PROVIDER_SITE_OTHER): Payer: Medicare HMO | Admitting: Internal Medicine

## 2018-03-29 VITALS — BP 148/76 | HR 65 | Temp 99.1°F | Resp 16 | Ht 63.5 in | Wt 142.0 lb

## 2018-03-29 DIAGNOSIS — K219 Gastro-esophageal reflux disease without esophagitis: Secondary | ICD-10-CM

## 2018-03-29 DIAGNOSIS — J320 Chronic maxillary sinusitis: Secondary | ICD-10-CM | POA: Diagnosis not present

## 2018-03-29 MED ORDER — FAMOTIDINE 20 MG PO TABS: 20.0000 mg | ORAL_TABLET | Freq: Two times a day (BID) | ORAL | 1 refills | Status: DC

## 2018-03-29 NOTE — Assessment & Plan Note (Signed)
Symptoms have improved Continue and complete doxycycline Continue supportive measures Call if no improvement

## 2018-03-29 NOTE — Patient Instructions (Addendum)
Complete the doxycycline.  Continue the saline nasal spray.    Start pepcid 20 mg twice daily and take for a while to make sure the heartburn is controlled.    Call if no improvement

## 2018-03-29 NOTE — Assessment & Plan Note (Signed)
Still having some GERD symptoms - intermittently - ? Affecting some of her symptoms Start pepcid 20 mg BID
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
## 2018-04-28 ENCOUNTER — Ambulatory Visit (INDEPENDENT_AMBULATORY_CARE_PROVIDER_SITE_OTHER): Payer: Medicare HMO | Admitting: Internal Medicine

## 2018-04-28 ENCOUNTER — Encounter: Payer: Self-pay | Admitting: Internal Medicine

## 2018-04-28 VITALS — BP 116/72 | HR 51 | Temp 99.4°F | Resp 16 | Ht 63.5 in | Wt 142.8 lb

## 2018-04-28 DIAGNOSIS — J101 Influenza due to other identified influenza virus with other respiratory manifestations: Secondary | ICD-10-CM | POA: Diagnosis not present

## 2018-04-28 LAB — POCT INFLUENZA A/B: Influenza A, POC: POSITIVE — AB
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
## 2018-04-28 NOTE — Assessment & Plan Note (Signed)
Influenza A positive Out of the window for Tamiflu and given her mild case of do not think it will help Increase rest and fluids Symptomatic treatment with over-the-counter cold medications, ibuprofen as needed Call with any concerns

## 2018-04-28 NOTE — Patient Instructions (Signed)
Increase rest and fluids.  Take over the counter cold medications as needed.     Influenza, Adult Influenza, more commonly known as "the flu," is a viral infection that mainly affects the respiratory tract. The respiratory tract includes organs that help you breathe, such as the lungs, nose, and throat. The flu causes many symptoms similar to the common cold along with high fever and body aches. The flu spreads easily from person to person (is contagious). Getting a flu shot (influenza vaccination) every year is the best way to prevent the flu. What are the causes? This condition is caused by the influenza virus. You can get the virus by:  Breathing in droplets that are in the air from an infected person's cough or sneeze.  Touching something that has been exposed to the virus (has been contaminated) and then touching your mouth, nose, or eyes. What increases the risk? The following factors may make you more likely to get the flu:  Not washing or sanitizing your hands often.  Having close contact with many people during cold and flu season.  Touching your mouth, eyes, or nose without first washing or sanitizing your hands.  Not getting a yearly (annual) flu shot. You may have a higher risk for the flu, including serious problems such as a lung infection (pneumonia), if you:  Are older than 65.  Are pregnant.  Have a weakened disease-fighting system (immune system). You may have a weakened immune system if you: ? Have HIV or AIDS. ? Are undergoing chemotherapy. ? Are taking medicines that reduce (suppress) the activity of your immune system.  Have a long-term (chronic) illness, such as heart disease, kidney disease, diabetes, or lung disease.  Have a liver disorder.  Are severely overweight (morbidly obese).  Have anemia. This is a condition that affects your red blood cells.  Have asthma. What are the signs or symptoms? Symptoms of this condition usually begin suddenly and  last 4-14 days. They may include:  Fever and chills.  Headaches, body aches, or muscle aches.  Sore throat.  Cough.  Runny or stuffy (congested) nose.  Chest discomfort.  Poor appetite.  Weakness or fatigue.  Dizziness.  Nausea or vomiting. How is this diagnosed? This condition may be diagnosed based on:  Your symptoms and medical history.  A physical exam.  Swabbing your nose or throat and testing the fluid for the influenza virus. How is this treated? If the flu is diagnosed early, you can be treated with medicine that can help reduce how severe the illness is and how long it lasts (antiviral medicine). This may be given by mouth (orally) or through an IV. Taking care of yourself at home can help relieve symptoms. Your health care provider may recommend:  Taking over-the-counter medicines.  Drinking plenty of fluids. In many cases, the flu goes away on its own. If you have severe symptoms or complications, you may be treated in a hospital. Follow these instructions at home: Activity  Rest as needed and get plenty of sleep.  Stay home from work or school as told by your health care provider. Unless you are visiting your health care provider, avoid leaving home until your fever has been gone for 24 hours without taking medicine. Eating and drinking  Take an oral rehydration solution (ORS). This is a drink that is sold at pharmacies and retail stores.  Drink enough fluid to keep your urine pale yellow.  Drink clear fluids in small amounts as you are able. Clear  fluids include water, ice chips, diluted fruit juice, and low-calorie sports drinks.  Eat bland, easy-to-digest foods in small amounts as you are able. These foods include bananas, applesauce, rice, lean meats, toast, and crackers.  Avoid drinking fluids that contain a lot of sugar or caffeine, such as energy drinks, regular sports drinks, and soda.  Avoid alcohol.  Avoid spicy or fatty foods. General  instructions      Take over-the-counter and prescription medicines only as told by your health care provider.  Use a cool mist humidifier to add humidity to the air in your home. This can make it easier to breathe.  Cover your mouth and nose when you cough or sneeze.  Wash your hands with soap and water often, especially after you cough or sneeze. If soap and water are not available, use alcohol-based hand sanitizer.  Keep all follow-up visits as told by your health care provider. This is important. How is this prevented?   Get an annual flu shot. You may get the flu shot in late summer, fall, or winter. Ask your health care provider when you should get your flu shot.  Avoid contact with people who are sick during cold and flu season. This is generally fall and winter. Contact a health care provider if:  You develop new symptoms.  You have: ? Chest pain. ? Diarrhea. ? A fever.  Your cough gets worse.  You produce more mucus.  You feel nauseous or you vomit. Get help right away if:  You develop shortness of breath or difficulty breathing.  Your skin or nails turn a bluish color.  You have severe pain or stiffness in your neck.  You develop a sudden headache or sudden pain in your face or ear.  You cannot eat or drink without vomiting. Summary  Influenza, more commonly known as "the flu," is a viral infection that primarily affects your respiratory tract.  Symptoms of the flu usually begin suddenly and last 4-14 days.  Getting an annual flu shot is the best way to prevent getting the flu.  Stay home from work or school as told by your health care provider. Unless you are visiting your health care provider, avoid leaving home until your fever has been gone for 24 hours without taking medicine.  Keep all follow-up visits as told by your health care provider. This is important. This information is not intended to replace advice given to you by your health care  provider. Make sure you discuss any questions you have with your health care provider. Document Released: 03/07/2000 Document Revised: 08/26/2017 Document Reviewed: 08/26/2017 Elsevier Interactive Patient Education  2019 [employer].

## 2018-04-29 DIAGNOSIS — Z0100 Encounter for examination of eyes and vision without abnormal findings: Secondary | ICD-10-CM | POA: Diagnosis not present

## 2018-05-04 ENCOUNTER — Encounter: Payer: Self-pay | Admitting: Family

## 2018-05-04 ENCOUNTER — Ambulatory Visit (INDEPENDENT_AMBULATORY_CARE_PROVIDER_SITE_OTHER): Payer: Medicare HMO | Admitting: Family

## 2018-05-04 VITALS — BP 112/62 | HR 71 | Temp 98.5°F | Ht 63.5 in | Wt 142.0 lb

## 2018-05-04 DIAGNOSIS — J101 Influenza due to other identified influenza virus with other respiratory manifestations: Secondary | ICD-10-CM | POA: Diagnosis not present

## 2018-05-04 DIAGNOSIS — R05 Cough: Secondary | ICD-10-CM

## 2018-05-04 DIAGNOSIS — R058 Other specified cough: Secondary | ICD-10-CM

## 2018-05-04 MED ORDER — BENZONATATE 100 MG PO CAPS: 100.0000 mg | ORAL_CAPSULE | Freq: Three times a day (TID) | ORAL | 0 refills | Status: DC | PRN

## 2018-05-04 MED ORDER — FLUTICASONE PROPIONATE 50 MCG/ACT NA SUSP: 2.0000 | Freq: Every day | NASAL | 6 refills | Status: DC
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
## 2018-05-05 ENCOUNTER — Encounter: Payer: Self-pay | Admitting: Internal Medicine

## 2018-05-05 ENCOUNTER — Ambulatory Visit (INDEPENDENT_AMBULATORY_CARE_PROVIDER_SITE_OTHER): Payer: Medicare HMO | Admitting: Internal Medicine

## 2018-05-05 VITALS — BP 126/82 | HR 82 | Temp 98.8°F | Resp 16 | Ht 63.5 in | Wt 142.0 lb

## 2018-05-05 DIAGNOSIS — R059 Cough, unspecified: Secondary | ICD-10-CM

## 2018-05-05 DIAGNOSIS — R51 Headache: Secondary | ICD-10-CM | POA: Diagnosis not present

## 2018-05-05 DIAGNOSIS — R519 Headache, unspecified: Secondary | ICD-10-CM | POA: Insufficient documentation

## 2018-05-05 DIAGNOSIS — R05 Cough: Secondary | ICD-10-CM

## 2018-05-05 NOTE — Assessment & Plan Note (Signed)
Post viral No evidence of active infection Tessalon perles prn or other cough suppressant

## 2018-05-05 NOTE — Assessment & Plan Note (Signed)
No evidence of a bacterial infection flonase daily Saline spray ibuprofen prn

## 2018-05-05 NOTE — Patient Instructions (Addendum)
Continue the flonase nasal spray daily.  Use the saline spray.    Take the tessalon perles as needed for cough.    Continue increased rest and fluids.

## 2018-05-25 DIAGNOSIS — Z01419 Encounter for gynecological examination (general) (routine) without abnormal findings: Secondary | ICD-10-CM | POA: Diagnosis not present

## 2018-05-25 DIAGNOSIS — M810 Age-related osteoporosis without current pathological fracture: Secondary | ICD-10-CM | POA: Diagnosis not present

## 2018-05-28 ENCOUNTER — Encounter: Payer: Self-pay | Admitting: Internal Medicine

## 2018-05-30 ENCOUNTER — Encounter: Payer: Self-pay | Admitting: Internal Medicine

## 2018-06-09 ENCOUNTER — Encounter: Payer: Self-pay | Admitting: Internal Medicine

## 2018-06-13 ENCOUNTER — Encounter: Payer: Self-pay | Admitting: Internal Medicine

## 2018-06-14 ENCOUNTER — Encounter: Payer: Self-pay | Admitting: Internal Medicine

## 2018-06-22 ENCOUNTER — Encounter: Payer: Medicare HMO | Admitting: Internal Medicine

## 2018-07-07 ENCOUNTER — Encounter: Payer: Self-pay | Admitting: Internal Medicine

## 2018-07-19 ENCOUNTER — Other Ambulatory Visit: Payer: Self-pay | Admitting: Family Medicine

## 2018-07-19 ENCOUNTER — Other Ambulatory Visit: Payer: Self-pay | Admitting: Internal Medicine

## 2018-07-19 NOTE — Telephone Encounter (Signed)
Requested medication (s) are due for refill today: yes  Requested medication (s) are on the active medication list: yes  Last refill:  03/02/18  Future visit scheduled: yes  Notes to clinic:  50,000 IU strengths not delegated to NT to refill Pt due for Ca,Phosphate, vitamin D level   Requested Prescriptions  Pending Prescriptions Disp Refills   Vitamin D, Ergocalciferol, (DRISDOL) 1.25 MG (50000 UT) CAPS capsule 12 capsule 0     Endocrinology:  Vitamins - Vitamin D Supplementation Failed - 07/19/2018 10:21 AM      Failed - 50,000 IU strengths are not delegated      Failed - Ca in normal range and within 360 days    Calcium  Date Value Ref Range Status  01/27/2017 9.9 8.4 - 10.5 mg/dL Final         Failed - Phosphate in normal range and within 360 days    No results found for: PHOS       Failed - Vitamin D in normal range and within 360 days    VITD  Date Value Ref Range Status  08/28/2016 38.67 30.00 - 100.00 ng/mL Final         Passed - Valid encounter within last 12 months    Recent Outpatient Visits          2 months ago Cough   [place], Stacy J, MD   2 months ago Post-viral cough syndrome   [place], Marvis Repress, [place]   2 months ago [place], Claudina Lick, MD   3 months ago Maxillary sinusitis, unspecified chronicity   [place], Claudina Lick, MD   3 months ago Respiratory illness   Therapist, [facility]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

## 2018-07-20 ENCOUNTER — Encounter: Payer: Self-pay | Admitting: Family Medicine

## 2018-07-26 DIAGNOSIS — L814 Other melanin hyperpigmentation: Secondary | ICD-10-CM | POA: Diagnosis not present

## 2018-07-26 DIAGNOSIS — D2261 Melanocytic nevi of right upper limb, including shoulder: Secondary | ICD-10-CM | POA: Diagnosis not present

## 2018-07-26 DIAGNOSIS — D2272 Melanocytic nevi of left lower limb, including hip: Secondary | ICD-10-CM | POA: Diagnosis not present

## 2018-07-26 DIAGNOSIS — D225 Melanocytic nevi of trunk: Secondary | ICD-10-CM | POA: Diagnosis not present

## 2018-07-26 DIAGNOSIS — L720 Epidermal cyst: Secondary | ICD-10-CM | POA: Diagnosis not present

## 2018-07-26 DIAGNOSIS — D2271 Melanocytic nevi of right lower limb, including hip: Secondary | ICD-10-CM | POA: Diagnosis not present

## 2018-07-26 DIAGNOSIS — L821 Other seborrheic keratosis: Secondary | ICD-10-CM | POA: Diagnosis not present

## 2018-07-26 DIAGNOSIS — D2262 Melanocytic nevi of left upper limb, including shoulder: Secondary | ICD-10-CM | POA: Diagnosis not present

## 2018-07-29 DIAGNOSIS — Z20828 Contact with and (suspected) exposure to other viral communicable diseases: Secondary | ICD-10-CM | POA: Diagnosis not present

## 2018-07-30 ENCOUNTER — Encounter: Payer: Self-pay | Admitting: Internal Medicine

## 2018-07-30 ENCOUNTER — Telehealth: Payer: Self-pay

## 2018-07-30 DIAGNOSIS — E785 Hyperlipidemia, unspecified: Secondary | ICD-10-CM

## 2018-07-30 DIAGNOSIS — M810 Age-related osteoporosis without current pathological fracture: Secondary | ICD-10-CM

## 2018-07-30 DIAGNOSIS — Z0000 Encounter for general adult medical examination without abnormal findings: Secondary | ICD-10-CM

## 2018-07-30 DIAGNOSIS — R739 Hyperglycemia, unspecified: Secondary | ICD-10-CM

## 2018-07-30 NOTE — Addendum Note (Signed)
Addended by: Binnie Rail on: 07/30/2018 03:39 PM   Modules accepted: Orders

## 2018-07-30 NOTE — Telephone Encounter (Signed)
Patient would like labs put in to get done Monday morning before CPE at 2:00.

## 2018-07-30 NOTE — Telephone Encounter (Signed)
Blood work ordered.

## 2018-07-31 ENCOUNTER — Encounter: Payer: Self-pay | Admitting: Internal Medicine
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
## 2018-08-01 NOTE — Patient Instructions (Addendum)
All other Health Maintenance issues reviewed.   All recommended immunizations and age-appropriate screenings are up-to-date or discussed.  No immunizations administered today.   Medications reviewed and updated.  Changes include :   None.  Take 2000 units of vitamin d.     Please followup in one year.     Health Maintenance, Female Adopting a healthy lifestyle and getting preventive care can go a long way to promote health and wellness. Talk with your health care provider about what schedule of regular examinations is right for you. This is a good chance for you to check in with your provider about disease prevention and staying healthy. In between checkups, there are plenty of things you can do on your own. Experts have done a lot of research about which lifestyle changes and preventive measures are most likely to keep you healthy. Ask your health care provider for more information. Weight and diet Eat a healthy diet  Be sure to include plenty of vegetables, fruits, low-fat dairy products, and lean protein.  Do not eat a lot of foods high in solid fats, added sugars, or salt.  Get regular exercise. This is one of the most important things you can do for your health. ? Most adults should exercise for at least 150 minutes each week. The exercise should increase your heart rate and make you sweat (moderate-intensity exercise). ? Most adults should also do strengthening exercises at least twice a week. This is in addition to the moderate-intensity exercise. Maintain a healthy weight  Body mass index (BMI) is a measurement that can be used to identify possible weight problems. It estimates body fat based on height and weight. Your health care provider can help determine your BMI and help you achieve or maintain a healthy weight.  For females 67 years of age and older: ? A BMI below 18.5 is considered underweight. ? A BMI of 18.5 to 24.9 is normal. ? A BMI of 25 to 29.9 is considered  overweight. ? A BMI of 30 and above is considered obese. Watch levels of cholesterol and blood lipids  You should start having your blood tested for lipids and cholesterol at 68 years of age, then have this test every 5 years.  You may need to have your cholesterol levels checked more often if: ? Your lipid or cholesterol levels are high. ? You are older than 68 years of age. ? You are at high risk for heart disease. Cancer screening Lung Cancer  Lung cancer screening is recommended for adults 29-64 years old who are at high risk for lung cancer because of a history of smoking.  A yearly low-dose CT scan of the lungs is recommended for people who: ? Currently smoke. ? Have quit within the past 15 years. ? Have at least a 30-pack-year history of smoking. A pack year is smoking an average of one pack of cigarettes a day for 1 year.  Yearly screening should continue until it has been 15 years since you quit.  Yearly screening should stop if you develop a health problem that would prevent you from having lung cancer treatment. Breast Cancer  Practice breast self-awareness. This means understanding how your breasts normally appear and feel.  It also means doing regular breast self-exams. Let your health care provider know about any changes, no matter how small.  If you are in your 20s or 30s, you should have a clinical breast exam (CBE) by a health care provider every 1-3 years as part  of a regular health exam.  If you are 47 or older, have a CBE every year. Also consider having a breast X-ray (mammogram) every year.  If you have a family history of breast cancer, talk to your health care provider about genetic screening.  If you are at high risk for breast cancer, talk to your health care provider about having an MRI and a mammogram every year.  Breast cancer gene (BRCA) assessment is recommended for women who have family members with BRCA-related cancers. BRCA-related cancers  include: ? Breast. ? Ovarian. ? Tubal. ? Peritoneal cancers.  Results of the assessment will determine the need for genetic counseling and BRCA1 and BRCA2 testing. Cervical Cancer Your health care provider may recommend that you be screened regularly for cancer of the pelvic organs (ovaries, uterus, and vagina). This screening involves a pelvic examination, including checking for microscopic changes to the surface of your cervix (Pap test). You may be encouraged to have this screening done every 3 years, beginning at age 68.  For women ages 50-65, health care providers may recommend pelvic exams and Pap testing every 3 years, or they may recommend the Pap and pelvic exam, combined with testing for human papilloma virus (HPV), every 5 years. Some types of HPV increase your risk of cervical cancer. Testing for HPV may also be done on women of any age with unclear Pap test results.  Other health care providers may not recommend any screening for nonpregnant women who are considered low risk for pelvic cancer and who do not have symptoms. Ask your health care provider if a screening pelvic exam is right for you.  If you have had past treatment for cervical cancer or a condition that could lead to cancer, you need Pap tests and screening for cancer for at least 20 years after your treatment. If Pap tests have been discontinued, your risk factors (such as having a new sexual partner) need to be reassessed to determine if screening should resume. Some women have medical problems that increase the chance of getting cervical cancer. In these cases, your health care provider may recommend more frequent screening and Pap tests. Colorectal Cancer  This type of cancer can be detected and often prevented.  Routine colorectal cancer screening usually begins at 68 years of age and continues through 68 years of age.  Your health care provider may recommend screening at an earlier age if you have risk factors for  colon cancer.  Your health care provider may also recommend using home test kits to check for hidden blood in the stool.  A small camera at the end of a tube can be used to examine your colon directly (sigmoidoscopy or colonoscopy). This is done to check for the earliest forms of colorectal cancer.  Routine screening usually begins at age 70.  Direct examination of the colon should be repeated every 5-10 years through 68 years of age. However, you may need to be screened more often if early forms of precancerous polyps or small growths are found. Skin Cancer  Check your skin from head to toe regularly.  Tell your health care provider about any new moles or changes in moles, especially if there is a change in a mole's shape or color.  Also tell your health care provider if you have a mole that is larger than the size of a pencil eraser.  Always use sunscreen. Apply sunscreen liberally and repeatedly throughout the day.  Protect yourself by wearing long sleeves, pants, a  wide-brimmed hat, and sunglasses whenever you are outside. Heart disease, diabetes, and high blood pressure  High blood pressure causes heart disease and increases the risk of stroke. High blood pressure is more likely to develop in: ? People who have blood pressure in the high end of the normal range (130-139/85-89 mm Hg). ? People who are overweight or obese. ? People who are African American.  If you are 59-5 years of age, have your blood pressure checked every 3-5 years. If you are 46 years of age or older, have your blood pressure checked every year. You should have your blood pressure measured twice-once when you are at a hospital or clinic, and once when you are not at a hospital or clinic. Record the average of the two measurements. To check your blood pressure when you are not at a hospital or clinic, you can use: ? An automated blood pressure machine at a pharmacy. ? A home blood pressure monitor.  If you are  between 13 years and 19 years old, ask your health care provider if you should take aspirin to prevent strokes.  Have regular diabetes screenings. This involves taking a blood sample to check your fasting blood sugar level. ? If you are at a normal weight and have a low risk for diabetes, have this test once every three years after 68 years of age. ? If you are overweight and have a high risk for diabetes, consider being tested at a younger age or more often. Preventing infection Hepatitis B  If you have a higher risk for hepatitis B, you should be screened for this virus. You are considered at high risk for hepatitis B if: ? You were born in a country where hepatitis B is common. Ask your health care provider which countries are considered high risk. ? Your parents were born in a high-risk country, and you have not been immunized against hepatitis B (hepatitis B vaccine). ? You have HIV or AIDS. ? You use needles to inject street drugs. ? You live with someone who has hepatitis B. ? You have had sex with someone who has hepatitis B. ? You get hemodialysis treatment. ? You take certain medicines for conditions, including cancer, organ transplantation, and autoimmune conditions. Hepatitis C  Blood testing is recommended for: ? Everyone born from [birth year] through 1965. ? Anyone with known risk factors for hepatitis C. Sexually transmitted infections (STIs)  You should be screened for sexually transmitted infections (STIs) including gonorrhea and chlamydia if: ? You are sexually active and are younger than 68 years of age. ? You are older than 68 years of age and your health care provider tells you that you are at risk for this type of infection. ? Your sexual activity has changed since you were last screened and you are at an increased risk for chlamydia or gonorrhea. Ask your health care provider if you are at risk.  If you do not have HIV, but are at risk, it may be recommended that you take  a prescription medicine daily to prevent HIV infection. This is called pre-exposure prophylaxis (PrEP). You are considered at risk if: ? You are sexually active and do not regularly use condoms or know the HIV status of your partner(s). ? You take drugs by injection. ? You are sexually active with a partner who has HIV. Talk with your health care provider about whether you are at high risk of being infected with HIV. If you choose to begin PrEP, you should  first be tested for HIV. You should then be tested every 3 months for as long as you are taking PrEP. Pregnancy  If you are premenopausal and you may become pregnant, ask your health care provider about preconception counseling.  If you may become pregnant, take 400 to 800 micrograms (mcg) of folic acid every day.  If you want to prevent pregnancy, talk to your health care provider about birth control (contraception). Osteoporosis and menopause  Osteoporosis is a disease in which the bones lose minerals and strength with aging. This can result in serious bone fractures. Your risk for osteoporosis can be identified using a bone density scan.  If you are 41 years of age or older, or if you are at risk for osteoporosis and fractures, ask your health care provider if you should be screened.  Ask your health care provider whether you should take a calcium or vitamin D supplement to lower your risk for osteoporosis.  Menopause may have certain physical symptoms and risks.  Hormone replacement therapy may reduce some of these symptoms and risks. Talk to your health care provider about whether hormone replacement therapy is right for you. Follow these instructions at home:  Schedule regular health, dental, and eye exams.  Stay current with your immunizations.  Do not use any tobacco products including cigarettes, chewing tobacco, or electronic cigarettes.  If you are pregnant, do not drink alcohol.  If you are breastfeeding, limit how  much and how often you drink alcohol.  Limit alcohol intake to no more than 1 drink per day for nonpregnant women. One drink equals 12 ounces of beer, 5 ounces of wine, or 1 ounces of hard liquor.  Do not use street drugs.  Do not share needles.  Ask your health care provider for help if you need support or information about quitting drugs.  Tell your health care provider if you often feel depressed.  Tell your health care provider if you have ever been abused or do not feel safe at home. This information is not intended to replace advice given to you by your health care provider. Make sure you discuss any questions you have with your health care provider. Document Released: 09/23/2010 Document Revised: 08/16/2015 Document Reviewed: 12/12/2014 Elsevier Interactive Patient Education  2019 [employer].

## 2018-08-02 ENCOUNTER — Encounter: Payer: Self-pay | Admitting: Internal Medicine

## 2018-08-02 ENCOUNTER — Other Ambulatory Visit: Payer: Self-pay

## 2018-08-02 ENCOUNTER — Other Ambulatory Visit (INDEPENDENT_AMBULATORY_CARE_PROVIDER_SITE_OTHER): Payer: Medicare HMO

## 2018-08-02 ENCOUNTER — Ambulatory Visit (INDEPENDENT_AMBULATORY_CARE_PROVIDER_SITE_OTHER): Payer: Medicare HMO | Admitting: Internal Medicine

## 2018-08-02 VITALS — BP 132/78 | HR 73 | Temp 98.7°F | Resp 16 | Ht 63.5 in | Wt 146.1 lb

## 2018-08-02 DIAGNOSIS — M810 Age-related osteoporosis without current pathological fracture: Secondary | ICD-10-CM

## 2018-08-02 DIAGNOSIS — Z0000 Encounter for general adult medical examination without abnormal findings: Secondary | ICD-10-CM

## 2018-08-02 DIAGNOSIS — E7849 Other hyperlipidemia: Secondary | ICD-10-CM | POA: Diagnosis not present

## 2018-08-02 DIAGNOSIS — K219 Gastro-esophageal reflux disease without esophagitis: Secondary | ICD-10-CM

## 2018-08-02 DIAGNOSIS — E785 Hyperlipidemia, unspecified: Secondary | ICD-10-CM | POA: Diagnosis not present

## 2018-08-02 DIAGNOSIS — R739 Hyperglycemia, unspecified: Secondary | ICD-10-CM

## 2018-08-02 DIAGNOSIS — M791 Myalgia, unspecified site: Secondary | ICD-10-CM

## 2018-08-02 LAB — LIPID PANEL
Cholesterol: 213 mg/dL — ABNORMAL HIGH (ref 0–200)
HDL: 96.8 mg/dL (ref >39.00)
LDL Cholesterol: 104 mg/dL — ABNORMAL HIGH (ref 0–99)
NonHDL: 116.34
Total CHOL/HDL Ratio: 2
Triglycerides: 62 mg/dL (ref 0.0–149.0)
VLDL: 12.4 mg/dL (ref 0.0–40.0)

## 2018-08-02 LAB — CBC WITH DIFFERENTIAL/PLATELET
Basophils Absolute: 0 10*3/uL (ref 0.0–0.1)
Basophils Relative: 0.5 % (ref 0.0–3.0)
Eosinophils Absolute: 0.1 10*3/uL (ref 0.0–0.7)
Eosinophils Relative: 1.8 % (ref 0.0–5.0)
HCT: 37.9 % (ref 36.0–46.0)
Hemoglobin: 13.1 g/dL (ref 12.0–15.0)
Lymphocytes Relative: 37.5 % (ref 12.0–46.0)
Lymphs Abs: 1.3 10*3/uL (ref 0.7–4.0)
MCHC: 34.4 g/dL (ref 30.0–36.0)
MCV: 88.7 fl (ref 78.0–100.0)
Monocytes Absolute: 0.4 10*3/uL (ref 0.1–1.0)
Monocytes Relative: 10.6 % (ref 3.0–12.0)
Neutro Abs: 1.8 10*3/uL (ref 1.4–7.7)
Neutrophils Relative %: 49.6 % (ref 43.0–77.0)
Platelets: 211 10*3/uL (ref 150.0–400.0)
RBC: 4.28 Mil/uL (ref 3.87–5.11)
RDW: 13.6 % (ref 11.5–15.5)
WBC: 3.5 10*3/uL — ABNORMAL LOW (ref 4.0–10.5)

## 2018-08-02 LAB — COMPREHENSIVE METABOLIC PANEL
ALT: 11 U/L (ref 0–35)
AST: 14 U/L (ref 0–37)
Albumin: 4.3 g/dL (ref 3.5–5.2)
Alkaline Phosphatase: 56 U/L (ref 39–117)
BUN: 14 mg/dL (ref 6–23)
CO2: 29 mEq/L (ref 19–32)
Calcium: 9.1 mg/dL (ref 8.4–10.5)
Chloride: 103 mEq/L (ref 96–112)
Creatinine, Ser: 0.6 mg/dL (ref 0.40–1.20)
GFR: 99.42 mL/min (ref >60.00)
Glucose, Bld: 99 mg/dL (ref 70–99)
Potassium: 3.9 mEq/L (ref 3.5–5.1)
Sodium: 139 mEq/L (ref 135–145)
Total Bilirubin: 0.5 mg/dL (ref 0.2–1.2)
Total Protein: 6.9 g/dL (ref 6.0–8.3)

## 2018-08-02 LAB — HEMOGLOBIN A1C: Hgb A1c MFr Bld: 5.6 % (ref 4.6–6.5)

## 2018-08-02 LAB — TSH: TSH: 2.52 u[IU]/mL (ref 0.35–4.50)

## 2018-08-02 LAB — VITAMIN D 25 HYDROXY (VIT D DEFICIENCY, FRACTURES): VITD: 34.36 ng/mL (ref 30.00–100.00)

## 2018-08-02 NOTE — Assessment & Plan Note (Signed)
Controlled with diet 

## 2018-08-02 NOTE — Assessment & Plan Note (Signed)
DEXA up-to-date Taking calcium and vitamin D Recommended lifting weights Next DEXA due 05/2019 Would prefer to avoid medication-we will reevaluate after her next DEXA

## 2018-08-02 NOTE — Assessment & Plan Note (Signed)
Lifestyle controlled Continue regular exercise and healthy diet

## 2018-08-02 NOTE — Assessment & Plan Note (Signed)
Mild soreness right psoas Not kidney-reassured her Treat symptomatically

## 2018-08-02 NOTE — Assessment & Plan Note (Signed)
A1c 5.6% Continue regular exercise and avoidance of too many sugars We will monitor annually

## 2018-08-03 ENCOUNTER — Encounter: Payer: Self-pay | Admitting: Internal Medicine

## 2018-08-20 DIAGNOSIS — Z1231 Encounter for screening mammogram for malignant neoplasm of breast: Secondary | ICD-10-CM | POA: Diagnosis not present

## 2018-08-20 LAB — HM MAMMOGRAPHY

## 2018-08-21 ENCOUNTER — Encounter: Payer: Self-pay | Admitting: Internal Medicine

## 2018-08-24 ENCOUNTER — Encounter: Payer: Self-pay | Admitting: Internal Medicine

## 2018-08-27 ENCOUNTER — Encounter: Payer: Self-pay | Admitting: Internal Medicine

## 2018-09-17 ENCOUNTER — Encounter: Payer: Medicare HMO | Admitting: Internal Medicine

## 2018-10-16 ENCOUNTER — Encounter: Payer: Self-pay | Admitting: Family Medicine

## 2018-10-19 ENCOUNTER — Ambulatory Visit: Payer: Medicare HMO | Admitting: Family Medicine

## 2018-10-19 ENCOUNTER — Other Ambulatory Visit: Payer: Self-pay

## 2018-10-19 ENCOUNTER — Ambulatory Visit: Payer: Self-pay

## 2018-10-19 ENCOUNTER — Encounter: Payer: Self-pay | Admitting: Family Medicine

## 2018-10-19 VITALS — BP 112/70 | HR 77 | Ht 63.5 in | Wt 147.0 lb

## 2018-10-19 DIAGNOSIS — D3613 Benign neoplasm of peripheral nerves and autonomic nervous system of lower limb, including hip: Secondary | ICD-10-CM | POA: Insufficient documentation

## 2018-10-19 DIAGNOSIS — M79671 Pain in right foot: Secondary | ICD-10-CM

## 2018-10-19 DIAGNOSIS — M216X9 Other acquired deformities of unspecified foot: Secondary | ICD-10-CM

## 2018-10-19 MED ORDER — GABAPENTIN 100 MG PO CAPS: 200.0000 mg | ORAL_CAPSULE | Freq: Every day | ORAL | 0 refills | Status: DC

## 2018-10-19 MED ORDER — MELOXICAM 15 MG PO TABS: 15.0000 mg | ORAL_TABLET | Freq: Every day | ORAL | 0 refills | Status: DC

## 2018-10-19 NOTE — Patient Instructions (Signed)
Gabapentin at night Shoes: Jonelle Sidle, On cloud Meloxicam daily for 10 days then as needed Neuroma Voltaren OTC Avoid barefoot in the house See me again in 6 weeks

## 2018-10-19 NOTE — Progress Notes (Signed)
[name] [address] [place], [place] [address] Phone: [phone] Subjective:   [name], am serving as a scribe for Dr. Hulan Saas.  I'm seeing this patient by the request  of:    CC: foot pain   DTO:[id number]  Patient states that is having bilateral foot pain. Has been walking more since Covid. R>L. Pain improves with walking but the initial steps bother her. Pain over lateral aspect of foot near the 5th toe. Use IBU prn.    Past Medical History:  Diagnosis Date  . Cellulitis of leg, right 08/27/2016  . Hyperlipidemia   . Osteopenia   . Vitamin D deficiency    Past Surgical History:  Procedure Laterality Date  . COLONOSCOPY  03/2011   Dr Brodie;diminuitive polyp  . G 2 P 2    . TONSILLECTOMY     Social History   Socioeconomic History  . Marital status: Married    Spouse name: Not on file  . Number of children: 2  . Years of education: Not on file  . Highest education level: Not on file  Occupational History  . Occupation: Science writer  Social Needs  . Financial resource strain: Not on file  . Food insecurity    Worry: Not on file    Inability: Not on file  . Transportation needs    Medical: Not on file    Non-medical: Not on file  Tobacco Use  . Smoking status: Never Smoker  . Smokeless tobacco: Never Used  Substance and Sexual Activity  . Alcohol use: Yes    Alcohol/week: 14.0 standard drinks    Types: 14 Glasses of wine per week    Comment:  2 wine or beer nightly  . Drug use: No  . Sexual activity: Not on file  Lifestyle  . Physical activity    Days per week: Not on file    Minutes per session: Not on file  . Stress: Not on file  Relationships  . Social Herbalist on phone: Not on file    Gets together: Not on file    Attends religious service: Not on file    Active member of club or organization: Not on file    Attends meetings of clubs or organizations: Not on file    Relationship status: Not  on file  Other Topics Concern  . Not on file  Social History Narrative   Exercise:  Yoga, weights, walking      Two children, grandchildren   Allergies  Allergen Reactions  . Fosamax [Alendronate] Other (See Comments)    GERD   Family History  Problem Relation Age of Onset  . Melanoma Father   . Hyperlipidemia Mother   . Heart disease Mother        no MI  . Diabetes Paternal Grandmother        vision loss  . Diabetes Maternal Uncle   . Colon cancer Neg Hx   . Stomach cancer Neg Hx   . Esophageal cancer Neg Hx   . Rectal cancer Neg Hx   . Stroke Neg Hx          Current Outpatient Medications (Other):  Marland Kitchen  Ascorbic Acid (VITAMIN C) 1000 MG tablet, Take 1,000 mg by mouth daily.   .  Calcium Carb-Cholecalciferol (CALCIUM 1000 + D PO), Take by mouth daily.   .  cholecalciferol (VITAMIN D) 1000 units tablet, Take 1.5 tablets (1,500 Units total) by  mouth daily. Marland Kitchen  tretinoin (RETIN-A) 0.05 % cream, APPLY A PEASIZE AMOUNT ONTO THE FACE NIGHTLY    Past medical history, social, surgical and family history all reviewed in electronic medical record.  No pertanent information unless stated regarding to the chief complaint.   Review of Systems:  No headache, visual changes, nausea, vomiting, diarrhea, constipation, dizziness, abdominal pain, skin rash, fevers, chills, night sweats, weight loss, swollen lymph nodes, body aches, joint swelling, muscle aches, chest pain, shortness of breath, mood changes.   Objective  There were no vitals taken for this visit. Systems examined below as of    General: No apparent distress alert and oriented x3 mood and affect normal, dressed appropriately.  HEENT: Pupils equal, extraocular movements intact  Respiratory: Patient's speak in full sentences and does not appear short of breath  Cardiovascular: No lower extremity edema, non tender, no erythema  Skin: Warm dry intact with no signs of infection or rash on extremities or on axial skeleton.   Abdomen: Soft nontender  Neuro: Cranial nerves II through XII are intact, neurovascularly intact in all extremities with 2+ DTRs and 2+ pulses.  Lymph: No lymphadenopathy of posterior or anterior cervical chain or axillae bilaterally.  Gait normal with good balance and coordination.  MSK:  Non tender with full range of motion and good stability and symmetric strength and tone of shoulders, elbows, wrist, hip, knee and ankles bilaterally.  Foot exam shows the patient does have significant breakdown of the transverse arch.  Positive squeeze test.  Patient is tender to palpation over the intermetatarsal areas between the fourth and fifth.  Patient's ankle does have near full range of motion.  No swelling truly noted but patient does have some mild displacement of the plantar aspect of the fifth metatarsal laterally.  Limited musculoskeletal ultrasound was performed and interpreted by Lyndal Pulley  Limited ultrasound shows the patient does have neuromas noted in the fourth and fifth intermetatarsal space bilaterally.  No true cortical defect noted of any problems.  Otherwise unremarkable. 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  This included exercises focusing on stretching, strengthening, with significant focus on eccentric aspects.   Long term goals include an improvement in range of motion, strength, endurance as well as avoiding reinjury. Patient's frequency would include in 1-2 times a day, 3-5 times a week for a duration of 6-12 weeks. Exercises for the foot include:  Stretches to help lengthen the lower leg and plantar fascia areas Theraband exercises for the lower leg and ankle to help strengthen the surrounding area- dorsiflexion, plantarflexion, inversion, eversion Massage rolling on the plantar surface of the foot with a frozen bottle, tennis ball or golf ball Towel or marble pick-ups to strengthen the plantar surface  of the foot Weight bearing exercises to increase balance and overall stability  Proper technique shown and discussed handout in great detail with ATC.  All questions were discussed and answered.     Impression and Recommendations:     This case required medical decision making of moderate complexity. The above documentation has been reviewed and is accurate and complete Lyndal Pulley, DO       Note: This dictation was prepared with Dragon dictation along with smaller phrase technology. Any transcriptional errors that result from this process are unintentional.

## 2018-10-19 NOTE — Assessment & Plan Note (Signed)
Breakdown the transverse arch bilaterally.  Discussed over-the-counter orthotics, proper shoes, home exercise will work with Product/process development scientist.  Discussed which activities to doing which wants to avoid.  Increase activity slowly over the course of next several weeks.  Follow-up again in 4 to 8 weeks

## 2018-10-19 NOTE — Assessment & Plan Note (Signed)
Bilateral secondary to poor shoes.  We discussed proper fitting, rigid soled shoes, discussed home exercises to strengthen the transverse arch.  Follow-up again in 4 to 8 weeks worsening symptoms future consideration includes injections also formal physical therapy

## 2018-10-24 ENCOUNTER — Encounter: Payer: Self-pay | Admitting: Internal Medicine

## 2018-10-25 NOTE — Progress Notes (Signed)
Subjective:    Patient ID: Linda [name], female    DOB: 31-Oct-1950, 68 y.o.   MRN: [id number]  HPI The patient is here for an acute visit.   Dizziness:  It is more like a lightheadedness/spinning sensation.  It started last mid-week.  She has had cold symptoms for about two weeks.  She states congestion, sinus pain, sore throat, cough, headaches, lightheadedness/dizziness.  She denies fever/chills, ear pain, SOB.    She did try taking an over the counter allergy medication.     Medications and allergies reviewed with patient and updated if appropriate.  Patient Active Problem List   Diagnosis Date Noted  . 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 Muscle soreness 08/02/2018  . Skin burn 01/01/2018  . 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 Upper airway cough syndrome 11/17/2017  . Mid back pain on right side 08/14/2017  . Gastroesophageal reflux disease 07/02/2017  . History of colonic polyps 05/22/2014  . Hyperglycemia 09/04/2013  . Allergic rhinitis 08/06/2013  . ANXIETY STATE, UNSPECIFIED 11/29/2009  . Vitamin D deficiency 04/10/2008  . Hyperlipidemia 10/04/2007  . Osteoporosis 10/04/2007    Current Outpatient Medications on File Prior to Visit  Medication Sig Dispense Refill  . Ascorbic Acid (VITAMIN C) 1000 MG tablet Take 1,000 mg by mouth daily.      . Calcium Carb-Cholecalciferol (CALCIUM 1000 + D PO) Take by mouth daily.      . cholecalciferol (VITAMIN D) 1000 units tablet Take 1.5 tablets (1,500 Units total) by mouth daily. 90 tablet 2  . tretinoin (RETIN-A) 0.05 % cream APPLY A PEASIZE AMOUNT ONTO THE FACE NIGHTLY     No current facility-administered medications on file prior to visit.     Past Medical History:  Diagnosis Date  . Cellulitis of leg, right 08/27/2016  . Hyperlipidemia   . Osteopenia   . Vitamin D deficiency     Past Surgical History:  Procedure Laterality Date   . COLONOSCOPY  03/2011   Dr Brodie;diminuitive polyp  . G 2 P 2    . TONSILLECTOMY      Social History   Socioeconomic History  . Marital status: Married    Spouse name: Not on file  . Number of children: 2  . Years of education: Not on file  . Highest education level: Not on file  Occupational History  . Occupation: Science writer  Social Needs  . Financial resource strain: Not on file  . Food insecurity    Worry: Not on file    Inability: Not on file  . Transportation needs    Medical: Not on file    Non-medical: Not on file  Tobacco Use  . Smoking status: Never Smoker  . Smokeless tobacco: Never Used  Substance and Sexual Activity  . Alcohol use: Yes    Alcohol/week: 14.0 standard drinks    Types: 14 Glasses of wine per week    Comment:  2 wine or beer nightly  . Drug use: No  . Sexual activity: Not on file  Lifestyle  . Physical activity    Days per week: Not on file    Minutes per session: Not on file  . Stress: Not on file  Relationships  . Social Herbalist on phone: Not on file    Gets together: Not on file    Attends religious service: Not on file  Active member of club or organization: Not on file    Attends meetings of clubs or organizations: Not on file    Relationship status: Not on file  Other Topics Concern  . Not on file  Social History Narrative   Exercise:  Yoga, weights, walking      Two children, grandchildren    Family History  Problem Relation Age of Onset  . Melanoma Father   . Hyperlipidemia Mother   . Heart disease Mother        no MI  . Diabetes Paternal Grandmother        vision loss  . Diabetes Maternal Uncle   . Colon cancer Neg Hx   . Stomach cancer Neg Hx   . Esophageal cancer Neg Hx   . Rectal cancer Neg Hx   . Stroke Neg Hx     Review of Systems  Constitutional: Negative for chills and fever.  HENT: Positive for congestion, sinus pain and sore throat. Negative for ear pain.   Respiratory: Positive for  cough (productive). Negative for shortness of breath and wheezing.   Gastrointestinal: Negative for diarrhea and nausea.  Musculoskeletal: Negative for myalgias.  Neurological: Positive for dizziness, light-headedness and headaches.       Objective:   Vitals:   10/26/18 0744  BP: 122/72  Pulse: 85  Resp: 16  Temp: 98.7 F (37.1 C)  SpO2: 97%   BP Readings from Last 3 Encounters:  10/26/18 122/72  10/19/18 112/70  08/02/18 132/78   Wt Readings from Last 3 Encounters:  10/26/18 148 lb (67.1 kg)  10/19/18 147 lb (66.7 kg)  08/02/18 146 lb 1.9 oz (66.3 kg)   Body mass index is 25.81 kg/m.   Physical Exam    GENERAL APPEARANCE: Appears stated age, well appearing, NAD EYES: conjunctiva clear, no icterus HEENT: bilateral tympanic membranes and ear canals normal, oropharynx with no erythema, no thyromegaly, trachea midline, no cervical or supraclavicular lymphadenopathy LUNGS: Clear to auscultation without wheeze or crackles, unlabored breathing, good air entry bilaterally CARDIOVASCULAR: Normal S1,S2 without murmurs, no edema NEURO:  CN intact, normal sensation and strength in all extremities SKIN: Warm, dry      Assessment & Plan:    See Problem List for Assessment and Plan of chronic medical problems.

## 2018-10-25 NOTE — Patient Instructions (Addendum)
Medications reviewed and updated.  Changes include :   Augmentin for your sinus infection and the over the counter anti-histamine.  Use the meclizine as needed.    Your prescription(s) have been submitted to your pharmacy. Please take as directed and contact our office if you believe you are having problem(s) with the medication(s).     Sinusitis, Adult Sinusitis is inflammation of your sinuses. Sinuses are hollow spaces in the bones around your face. Your sinuses are located:  Around your eyes.  In the middle of your forehead.  Behind your nose.  In your cheekbones. Mucus normally drains out of your sinuses. When your nasal tissues become inflamed or swollen, mucus can become trapped or blocked. This allows bacteria, viruses, and fungi to grow, which leads to infection. Most infections of the sinuses are caused by a virus. Sinusitis can develop quickly. It can last for up to 4 weeks (acute) or for more than 12 weeks (chronic). Sinusitis often develops after a cold. What are the causes? This condition is caused by anything that creates swelling in the sinuses or stops mucus from draining. This includes:  Allergies.  Asthma.  Infection from bacteria or viruses.  Deformities or blockages in your nose or sinuses.  Abnormal growths in the nose (nasal polyps).  Pollutants, such as chemicals or irritants in the air.  Infection from fungi (rare). What increases the risk? You are more likely to develop this condition if you:  Have a weak body defense system (immune system).  Do a lot of swimming or diving.  Overuse nasal sprays.  Smoke. What are the signs or symptoms? The main symptoms of this condition are pain and a feeling of pressure around the affected sinuses. Other symptoms include:  Stuffy nose or congestion.  Thick drainage from your nose.  Swelling and warmth over the affected sinuses.  Headache.  Upper toothache.  A cough that may get worse at  night.  Extra mucus that collects in the throat or the back of the nose (postnasal drip).  Decreased sense of smell and taste.  Fatigue.  A fever.  Sore throat.  Bad breath. How is this diagnosed? This condition is diagnosed based on:  Your symptoms.  Your medical history.  A physical exam.  Tests to find out if your condition is acute or chronic. This may include: ? Checking your nose for nasal polyps. ? Viewing your sinuses using a device that has a light (endoscope). ? Testing for allergies or bacteria. ? Imaging tests, such as an MRI or CT scan. In rare cases, a bone biopsy may be done to rule out more serious types of fungal sinus disease. How is this treated? Treatment for sinusitis depends on the cause and whether your condition is chronic or acute.  If caused by a virus, your symptoms should go away on their own within 10 days. You may be given medicines to relieve symptoms. They include: ? Medicines that shrink swollen nasal passages (topical intranasal decongestants). ? Medicines that treat allergies (antihistamines). ? A spray that eases inflammation of the nostrils (topical intranasal corticosteroids). ? Rinses that help get rid of thick mucus in your nose (nasal saline washes).  If caused by bacteria, your health care provider may recommend waiting to see if your symptoms improve. Most bacterial infections will get better without antibiotic medicine. You may be given antibiotics if you have: ? A severe infection. ? A weak immune system.  If caused by narrow nasal passages or nasal polyps, you  may need to have surgery. Follow these instructions at home: Medicines  Take, use, or apply over-the-counter and prescription medicines only as told by your health care provider. These may include nasal sprays.  If you were prescribed an antibiotic medicine, take it as told by your health care provider. Do not stop taking the antibiotic even if you start to feel  better. Hydrate and humidify   Drink enough fluid to keep your urine pale yellow. Staying hydrated will help to thin your mucus.  Use a cool mist humidifier to keep the humidity level in your home above 50%.  Inhale steam for 10-15 minutes, 3-4 times a day, or as told by your health care provider. You can do this in the bathroom while a hot shower is running.  Limit your exposure to cool or dry air. Rest  Rest as much as possible.  Sleep with your head raised (elevated).  Make sure you get enough sleep each night. General instructions   Apply a warm, moist washcloth to your face 3-4 times a day or as told by your health care provider. This will help with discomfort.  Wash your hands often with soap and water to reduce your exposure to germs. If soap and water are not available, use hand sanitizer.  Do not smoke. Avoid being around people who are smoking (secondhand smoke).  Keep all follow-up visits as told by your health care provider. This is important. Contact a health care provider if:  You have a fever.  Your symptoms get worse.  Your symptoms do not improve within 10 days. Get help right away if:  You have a severe headache.  You have persistent vomiting.  You have severe pain or swelling around your face or eyes.  You have vision problems.  You develop confusion.  Your neck is stiff.  You have trouble breathing. Summary  Sinusitis is soreness and inflammation of your sinuses. Sinuses are hollow spaces in the bones around your face.  This condition is caused by nasal tissues that become inflamed or swollen. The swelling traps or blocks the flow of mucus. This allows bacteria, viruses, and fungi to grow, which leads to infection.  If you were prescribed an antibiotic medicine, take it as told by your health care provider. Do not stop taking the antibiotic even if you start to feel better.  Keep all follow-up visits as told by your health care provider.  This is important. This information is not intended to replace advice given to you by your health care provider. Make sure you discuss any questions you have with your health care provider. Document Released: 03/10/2005 Document Revised: 08/10/2017 Document Reviewed: 08/10/2017 Elsevier Patient Education  2020 [employer].

## 2018-10-26 ENCOUNTER — Ambulatory Visit (INDEPENDENT_AMBULATORY_CARE_PROVIDER_SITE_OTHER): Payer: Medicare HMO | Admitting: Internal Medicine

## 2018-10-26 ENCOUNTER — Encounter: Payer: Self-pay | Admitting: Internal Medicine

## 2018-10-26 ENCOUNTER — Other Ambulatory Visit: Payer: Self-pay

## 2018-10-26 VITALS — BP 122/72 | HR 85 | Temp 98.7°F | Resp 16 | Ht 63.5 in | Wt 148.0 lb

## 2018-10-26 DIAGNOSIS — R42 Dizziness and giddiness: Secondary | ICD-10-CM | POA: Insufficient documentation

## 2018-10-26 DIAGNOSIS — J0100 Acute maxillary sinusitis, unspecified: Secondary | ICD-10-CM

## 2018-10-26 MED ORDER — AMOXICILLIN-POT CLAVULANATE 875-125 MG PO TABS: 1.0000 | ORAL_TABLET | Freq: Two times a day (BID) | ORAL | 0 refills | Status: DC

## 2018-10-26 MED ORDER — MECLIZINE HCL 12.5 MG PO TABS: 12.5000 mg | ORAL_TABLET | Freq: Three times a day (TID) | ORAL | 0 refills | Status: DC | PRN

## 2018-10-26 NOTE — Assessment & Plan Note (Addendum)
Likely related to sinus infection Treat sinus infection Take meclizine prn Call if no improvement

## 2018-10-26 NOTE — Assessment & Plan Note (Signed)
Likely bacterial  Start augmentin Allergy medication otc cold medications prn Rest, fluid Call if no improvement

## 2018-11-01 ENCOUNTER — Encounter: Payer: Self-pay | Admitting: Internal Medicine

## 2018-11-01 DIAGNOSIS — R42 Dizziness and giddiness: Secondary | ICD-10-CM

## 2018-11-03 ENCOUNTER — Encounter: Payer: Self-pay | Admitting: Internal Medicine

## 2018-11-05 ENCOUNTER — Other Ambulatory Visit: Payer: Self-pay

## 2018-11-05 ENCOUNTER — Ambulatory Visit: Payer: Medicare HMO | Attending: Internal Medicine | Admitting: Rehabilitative and Restorative Service Providers"

## 2018-11-05 ENCOUNTER — Encounter: Payer: Self-pay | Admitting: Rehabilitative and Restorative Service Providers"

## 2018-11-05 DIAGNOSIS — R42 Dizziness and giddiness: Secondary | ICD-10-CM

## 2018-11-05 DIAGNOSIS — H8111 Benign paroxysmal vertigo, right ear: Secondary | ICD-10-CM

## 2018-11-05 DIAGNOSIS — R2681 Unsteadiness on feet: Secondary | ICD-10-CM

## 2018-11-05 NOTE — Therapy (Signed)
[facility] [address] [place], [place], [address] Phone: [phone]   Fax:  [phone]  Physical Therapy Evaluation  Patient Details  Name: Linda [name] MRN: [id number] Date of Birth: 03-14-1951 Referring Provider (PT): Billey Gosling, MD  CLINIC OPERATION CHANGES: [facility]patient [facility] is open at lower capacity following universal masking, social distancing, and patient screening.  The patient's COVID risk of complications score is 1.  Encounter Date: 11/05/2018  PT End of Session - 11/05/18 1620    Visit Number  1    Number of Visits  6    Date for PT Re-Evaluation  12/20/18    Authorization Type  [employer]    PT Start Time  1230    PT Stop Time  1310    PT Time Calculation (min)  40 min    Activity Tolerance  Patient tolerated treatment well    Behavior During Therapy  WFL for tasks assessed/performed       Past Medical History:  Diagnosis Date  . Cellulitis of leg, right 08/27/2016  . Hyperlipidemia   . Osteopenia   . Vitamin D deficiency     Past Surgical History:  Procedure Laterality Date  . COLONOSCOPY  03/2011   Dr Brodie;diminuitive polyp  . G 2 P 2    . TONSILLECTOMY      There were no vitals filed for this visit.   Subjective Assessment - 11/05/18 1236    Subjective  The patient awoke with dizziness approximately 2 weeks ago.  She had been sick with a bad cold 2-3 weeks prior to onset.  She saw MD early August and was prescribed antibiotic and meclizine (taking prn).    She is not continuing with dizziness worse when she first gets up, and when rolling in bed.  She denies nausea.  She does have frequent headache.  She denies vision changes, hearing changes.    Patient Stated Goals  Get rid of dizziness.    Currently in Pain?  No/denies         [facility] PT Assessment - 11/05/18 1239      Assessment   Medical Diagnosis  vertigo    Referring Provider (PT)  Billey Gosling, MD    Onset  Date/Surgical Date  10/25/18    Prior Therapy  none      Precautions   Precautions  None      Restrictions   Weight Bearing Restrictions  No      Balance Screen   Has the patient fallen in the past 6 months  No    Has the patient had a decrease in activity level because of a fear of falling?   No    Is the patient reluctant to leave their home because of a fear of falling?   No      Home Environment   Living Environment  Private residence           Vestibular Assessment - 11/05/18 1239      Vestibular Assessment   General Observation  The patient walks independently without device.      Symptom Behavior   Subjective history of current problem  Initially symptoms were constant, now more motion provoked    Type of Dizziness   Spinning;Lightheadedness    Frequency of Dizziness  daily, has to walk slowly    Duration of Dizziness  seconds to minutes    Symptom Nature  Motion provoked;Positional    Aggravating Factors  Spontaneous onset;Activity in general    Relieving Factors  Head stationary    Progression of Symptoms  No change since onset      Oculomotor Exam   Oculomotor Alignment  Normal    Ocular ROM  WFLs    Spontaneous  Absent    Gaze-induced   Absent    Smooth Pursuits  Intact    Saccades  Intact      Vestibulo-Ocular Reflex   Comment  Head impulse test=positive for refixation saccade bilaterally indicating bilateral vestibular hypofunction.      Positional Testing   Dix-Hallpike  Dix-Hallpike Right;Dix-Hallpike Left    Horizontal Canal Testing  Horizontal Canal Right;Horizontal Canal Left      Dix-Hallpike Right   Dix-Hallpike Right Duration  20 seconds    Dix-Hallpike Right Symptoms  Upbeat, right rotatory nystagmus      Dix-Hallpike Left   Dix-Hallpike Left Duration  0    Dix-Hallpike Left Symptoms  No nystagmus      Horizontal Canal Right   Horizontal Canal Right Duration  0    Horizontal Canal Right Symptoms  Normal      Horizontal Canal Left    Horizontal Canal Left Duration  0    Horizontal Canal Left Symptoms  Normal          Objective measurements completed on examination: See above findings.       Vestibular Treatment/Exercise - 11/05/18 1255      Vestibular Treatment/Exercise   Vestibular Treatment Provided  Canalith Repositioning    Canalith Repositioning  Epley Manuever Right    Gaze Exercises  X1 Viewing Horizontal       EPLEY MANUEVER RIGHT   Number of Reps   2    Overall Response  Symptoms Resolved      X1 Viewing Horizontal   Foot Position  standing    Comments  30 seconds without c/o difficulty today; PT to revisit this exercise at next session to determine if it is indicated            PT Education - 11/05/18 1619    Education Details  nature of BPPV and dec'd use of VOR    Person(s) Educated  Patient    Methods  Explanation;Demonstration;Handout    Comprehension  Verbalized understanding;Returned demonstration          PT Long Term Goals - 11/05/18 1623      PT LONG TERM GOAL #1   Title  The patient will have negative R dix hallpike indicating resolution of BPPv.    Time  6    Period  Weeks    Target Date  12/20/18      PT LONG TERM GOAL #2   Title  The patient will tolerate VOR x 1 viewing x 60 seconds without c/o dizziness or visual blurring.    Time  6    Period  Weeks    Target Date  12/20/18      PT LONG TERM GOAL #3   Title  The patient will report no dizziness with bed mobility.    Time  6    Period  Weeks    Target Date  12/20/18             Plan - 11/05/18 1625    Clinical Impression Statement  The patient is a 68 yo female presenting to OP therapy for vertigo.  She has positive positional testing for R posterior canalithiasis and also demonstrated refixation saccade during head impulse  testing, which may indicate nerve weakness.  Onset was associated with sinus/cold episode.  PT cleared R BPPV today with negative R dix hallpike after epley's maneuver.   Will reassess next visit to determine if further HEP is needed to address vestibular hypofunction/weakness.    Examination-Activity Limitations  Bed Mobility    Stability/Clinical Decision Making  Stable/Uncomplicated    Clinical Decision Making  Low    Rehab Potential  Good    PT Frequency  1x / week    PT Duration  6 weeks    PT Treatment/Interventions  ADLs/Self Care Home Management;Neuromuscular re-education;Cognitive remediation;Canalith Repostioning;Gait training;Stair training;Functional mobility training;Therapeutic activities;Therapeutic exercise;Balance training;Patient/family education;Vestibular    PT Next Visit Plan  check BPPV, add VOR if needed, treat BPPV if indicated.    Consulted and Agree with Plan of Care  Patient       Patient will benefit from skilled therapeutic intervention in order to improve the following deficits and impairments:  Dizziness, Decreased balance  Visit Diagnosis: 1. BPPV (benign paroxysmal positional vertigo), right   2. Unsteadiness on feet   3. Dizziness and giddiness        Problem List Patient Active Problem List   Diagnosis Date Noted  . Dizziness 10/26/2018  . 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 Muscle soreness 08/02/2018  . Skin burn 01/01/2018  . 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 Upper airway cough syndrome 11/17/2017  . Mid back pain on right side 08/14/2017  . Acute non-recurrent maxillary sinusitis 07/02/2017  . Gastroesophageal reflux disease 07/02/2017  . History of colonic polyps 05/22/2014  . Hyperglycemia 09/04/2013  . Allergic rhinitis 08/06/2013  . ANXIETY STATE, UNSPECIFIED 11/29/2009  . Vitamin D deficiency 04/10/2008  . Hyperlipidemia 10/04/2007  . Osteoporosis 10/04/2007    Iona Stay, PT 11/05/2018, 4:27 PM  [facility] [address] [place], [place],  [address] Phone: [phone]   Fax:  [phone]  Name: Linda [name] MRN: [id number] Date of Birth: [birth year]

## 2018-11-06 ENCOUNTER — Encounter: Payer: Self-pay | Admitting: Rehabilitative and Restorative Service Providers"

## 2018-11-08 ENCOUNTER — Encounter: Payer: Self-pay | Admitting: Rehabilitative and Restorative Service Providers"

## 2018-11-10 ENCOUNTER — Encounter: Payer: Self-pay | Admitting: Rehabilitative and Restorative Service Providers"

## 2018-11-10 ENCOUNTER — Other Ambulatory Visit: Payer: Self-pay

## 2018-11-10 ENCOUNTER — Ambulatory Visit: Payer: Medicare HMO | Admitting: Rehabilitative and Restorative Service Providers"

## 2018-11-10 DIAGNOSIS — H8111 Benign paroxysmal vertigo, right ear: Secondary | ICD-10-CM | POA: Diagnosis not present

## 2018-11-10 DIAGNOSIS — R2681 Unsteadiness on feet: Secondary | ICD-10-CM

## 2018-11-10 DIAGNOSIS — R42 Dizziness and giddiness: Secondary | ICD-10-CM

## 2018-11-10 NOTE — Patient Instructions (Signed)
Access Code: [email or web address]  URL: [email or web address]  Date: 11/10/2018  Prepared by: Rudell Cobb   Exercises Brandt-Daroff Vestibular Exercise - 5 reps - 1 sets - 2x daily - 7x weekly Standing Gaze Stabilization with Head Rotation - 2 reps - 1 sets - 30 seconds hold - 2x daily - 7x weekly Gentle Levator Scapulae Stretch - 3 reps - 1 sets - 20-30 seconds hold - 2x daily - 7x weekly Standing Shoulder Shrug Circles AROM Backward - 10 reps - 1 sets - 2x daily - 7x weekly
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
## 2018-11-11 ENCOUNTER — Other Ambulatory Visit: Payer: Self-pay | Admitting: Family Medicine

## 2018-11-17 ENCOUNTER — Other Ambulatory Visit: Payer: Self-pay

## 2018-11-17 ENCOUNTER — Ambulatory Visit: Payer: Medicare HMO | Admitting: Rehabilitative and Restorative Service Providers"

## 2018-11-17 ENCOUNTER — Encounter: Payer: Self-pay | Admitting: Rehabilitative and Restorative Service Providers"

## 2018-11-17 DIAGNOSIS — R42 Dizziness and giddiness: Secondary | ICD-10-CM | POA: Diagnosis not present

## 2018-11-17 DIAGNOSIS — R2681 Unsteadiness on feet: Secondary | ICD-10-CM

## 2018-11-17 DIAGNOSIS — H8111 Benign paroxysmal vertigo, right ear: Secondary | ICD-10-CM | POA: Diagnosis not present

## 2018-11-17 NOTE — Therapy (Signed)
[facility] [address] [place], [place], [address] Phone: [phone]   Fax:  [phone]  Physical Therapy Treatment and Discharge Summary  Patient Details  Name: Linda [name] MRN: [id number] Date of Birth: Mar 21, 1951 Referring Provider (PT): Billey Gosling, MD   Encounter Date: 11/17/2018  PT End of Session - 11/17/18 0825    Visit Number  3    Number of Visits  6    Date for PT Re-Evaluation  12/20/18    Authorization Type  [employer]    PT Start Time  0807    PT Stop Time  0840    PT Time Calculation (min)  33 min    Activity Tolerance  Patient tolerated treatment well    Behavior During Therapy  [facility] for tasks assessed/performed       Past Medical History:  Diagnosis Date  . Cellulitis of leg, right 08/27/2016  . Hyperlipidemia   . Osteopenia   . Vitamin D deficiency     Past Surgical History:  Procedure Laterality Date  . COLONOSCOPY  03/2011   Dr Brodie;diminuitive polyp  . G 2 P 2    . TONSILLECTOMY      There were no vitals filed for this visit.  Subjective Assessment - 11/17/18 0808    Subjective  The patient reports symptoms are better, but not resolved.  She notes some feeling of dizziness when she gets up.    Patient Stated Goals  Get rid of dizziness.    Currently in Pain?  No/denies             Vestibular Assessment - 11/17/18 0812      Positional Testing   Dix-Hallpike  Dix-Hallpike Right;Dix-Hallpike Left    Sidelying Test  Sidelying Right;Sidelying Left    Horizontal Canal Testing  Horizontal Canal Right;Horizontal Canal Left      Dix-Hallpike Right   Dix-Hallpike Right Duration  0    Dix-Hallpike Right Symptoms  No nystagmus      Dix-Hallpike Left   Dix-Hallpike Left Duration  0    Dix-Hallpike Left Symptoms  No nystagmus      Sidelying Right   Sidelying Right Duration  0    Sidelying Right Symptoms  No nystagmus      Sidelying Left   Sidelying Left  Duration  0    Sidelying Left Symptoms  No nystagmus      Horizontal Canal Right   Horizontal Canal Right Duration  0    Horizontal Canal Right Symptoms  Normal      Horizontal Canal Left   Horizontal Canal Left Duration  0    Horizontal Canal Left Symptoms  Normal      Orthostatics   BP supine (x 5 minutes)  147/85    HR supine (x 5 minutes)  66    BP standing (after 1 minute)  130/88    HR standing (after 1 minute)  72    BP standing (after 3 minutes)  150/90    HR standing (after 3 minutes)  72    Orthostatics Comment  Patient noting mild lightheadedness with position changes, no nystagmus with positional changes today therefore checked for orthostatic changes.                 Vestibular Treatment/Exercise - 11/17/18 1159      Vestibular Treatment/Exercise   Vestibular Treatment Provided  Habituation    Habituation Exercises  Horizontal Roll      Horizontal  Roll   Number of Reps   4    Symptom Description   no dizziness with repeated rolling today                 PT Long Term Goals - 11/17/18 0815      PT LONG TERM GOAL #1   Title  The patient will have negative R dix hallpike indicating resolution of BPPv.    Time  6    Period  Weeks    Status  Achieved      PT LONG TERM GOAL #2   Title  The patient will tolerate VOR x 1 viewing x 60 seconds without c/o dizziness or visual blurring.    Baseline  Notes a brief sensation of lightheadedness, no dizziness.    Time  6    Period  Weeks    Status  Achieved      PT LONG TERM GOAL #3   Title  The patient will report no dizziness with bed mobility.    Baseline  Patient continues with mild lightheaded sensation.    Time  6    Period  Weeks    Status  Partially Met            Plan - 11/17/18 0837    Clinical Impression Statement  The patient does not have evidence today of positional vertigo.  She is continuing with tenderness to palpation of mastoid bones and notes a general sensation of  pain behind both ears.  She has some limitation in full return to walking program due to neuroma bilatral feet for which she is seeing Dr. Tamala Julian.  Patient has partially met long term goals.  She continues with lightheadedness intermittently with position changes.  True vertigo resolved and therefore PT d/c. today.    PT Treatment/Interventions  ADLs/Self Care Home Management;Neuromuscular re-education;Cognitive remediation;Canalith Repostioning;Gait training;Stair training;Functional mobility training;Therapeutic activities;Therapeutic exercise;Balance training;Patient/family education;Vestibular    PT Next Visit Plan  discharge today.    Consulted and Agree with Plan of Care  Patient       Patient will benefit from skilled therapeutic intervention in order to improve the following deficits and impairments:  Dizziness, Decreased balance  Visit Diagnosis: BPPV (benign paroxysmal positional vertigo), right  Dizziness and giddiness  Unsteadiness on feet    PHYSICAL THERAPY DISCHARGE SUMMARY  Visits from Start of Care: 3  Current functional level related to goals / functional outcomes: See above   Remaining deficits: Foot pain limiting regular walking (from neuroma-being followed by Dr. Tamala Julian) Mastoid pain (recommended she f/u with MD)   Education / Equipment: Has HEP for habituation.  Plan: Patient agrees to discharge.  Patient goals were partially met. Patient is being discharged due to meeting the stated rehab goals.  ?????      Thank you for the referral of this patient. Rudell Cobb, MPT  [place], PT 11/17/2018, 11:59 AM  [facility] [address] [place], [place], [address] Phone: [phone]   Fax:  [phone]  Name: Linda [name] MRN: [id number] Date of Birth: 07/23/1950

## 2018-11-18 ENCOUNTER — Ambulatory Visit (INDEPENDENT_AMBULATORY_CARE_PROVIDER_SITE_OTHER): Payer: Medicare HMO

## 2018-11-18 DIAGNOSIS — Z23 Encounter for immunization: Secondary | ICD-10-CM

## 2018-11-25 ENCOUNTER — Encounter: Payer: Self-pay | Admitting: Family Medicine

## 2018-11-29 NOTE — Progress Notes (Signed)
[name] [place], [place] [address] Phone: [phone] Subjective:    Linda [name], am serving as a scribe for Dr. Hulan Saas.  CC: Foot pain follow-up  [id number]   10/19/2018 Bilateral secondary to poor shoes.  We discussed proper fitting, rigid soled shoes, discussed home exercises to strengthen the transverse arch.  Follow-up again in 4 to 8 weeks worsening symptoms future consideration includes injections also formal physical therapy  Breakdown the transverse arch bilaterally.  Discussed over-the-counter orthotics, proper shoes, home exercise will work with Product/process development scientist.  Discussed which activities to doing which wants to avoid.  Increase activity slowly over the course of next several weeks.  Follow-up again in 4 to 8 weeks  Update 11/30/2018 Linda [name] is a 68 y.o. female coming in with complaint of bilateral foot pain. Patient states that her feet are not painful but are uncomfortable. Patient bought new shoes which has decreased her pain. Has not been walking as much. Has used bike and elliptical. Is using IBU instead of gabapentin.    Had started gabapentin last visit but patient did have some dizziness  Past Medical History:  Diagnosis Date  . Cellulitis of leg, right 08/27/2016  . Hyperlipidemia   . Osteopenia   . Vitamin D deficiency    Past Surgical History:  Procedure Laterality Date  . COLONOSCOPY  03/2011   Dr Brodie;diminuitive polyp  . G 2 P 2    . TONSILLECTOMY     Social History   Socioeconomic History  . Marital status: Married    Spouse name: Not on file  . Number of children: 2  . Years of education: Not on file  . Highest education level: Not on file  Occupational History  . Occupation: Science writer  Social Needs  . Financial resource strain: Not on file  . Food insecurity    Worry: Not on file    Inability: Not on file  . Transportation needs    Medical: Not on file    Non-medical: Not  on file  Tobacco Use  . Smoking status: Never Smoker  . Smokeless tobacco: Never Used  Substance and Sexual Activity  . Alcohol use: Yes    Alcohol/week: 14.0 standard drinks    Types: 14 Glasses of wine per week    Comment:  2 wine or beer nightly  . Drug use: [name]  . Sexual activity: Not on file  Lifestyle  . Physical activity    Days per week: Not on file    Minutes per session: Not on file  . Stress: Not on file  Relationships  . Social Herbalist on phone: Not on file    Gets together: Not on file    Attends religious service: Not on file    Active member of club or organization: Not on file    Attends meetings of clubs or organizations: Not on file    Relationship status: Not on file  Other Topics Concern  . Not on file  Social History Narrative   Exercise:  Yoga, weights, walking      Two children, grandchildren   Allergies  Allergen Reactions  . Fosamax [Alendronate] Other (See Comments)    GERD   Family History  Problem Relation Age of Onset  . Melanoma Father   . Hyperlipidemia Mother   . Heart disease Mother        [name] MI  . Diabetes Paternal Grandmother  vision loss  . Diabetes Maternal Uncle   . Colon cancer Neg Hx   . Stomach cancer Neg Hx   . Esophageal cancer Neg Hx   . Rectal cancer Neg Hx   . Stroke Neg Hx        Current Outpatient Medications (Analgesics):  .  meloxicam (MOBIC) 15 MG tablet, TAKE 1 TABLET BY MOUTH EVERY DAY   Current Outpatient Medications (Other):  .  amoxicillin-clavulanate (AUGMENTIN) 875-125 MG tablet, Take 1 tablet by mouth 2 (two) times daily. .  Ascorbic Acid (VITAMIN C) 1000 MG tablet, Take 1,000 mg by mouth daily.   .  Calcium Carb-Cholecalciferol (CALCIUM 1000 + D PO), Take by mouth daily.   .  cholecalciferol (VITAMIN D) 1000 units tablet, Take 1.5 tablets (1,500 Units total) by mouth daily. .  meclizine (ANTIVERT) 12.5 MG tablet, Take 1-2 tablets (12.5-25 mg total) by mouth 3 (three) times  daily as needed for dizziness. .  tretinoin (RETIN-A) 0.05 % cream, APPLY A PEASIZE AMOUNT ONTO THE FACE NIGHTLY    Past medical history, social, surgical and family history all reviewed in electronic medical record.  [name] pertanent information unless stated regarding to the chief complaint.   Review of Systems:  [name] headache, visual changes, nausea, vomiting, diarrhea, constipation, dizziness, abdominal pain, skin rash, fevers, chills, night sweats, weight loss, swollen lymph nodes, body aches, joint swelling, muscle aches, chest pain, shortness of breath, mood changes.   Objective  There were [name] vitals taken for this visit. Systems examined below as of    General: [name] apparent distress alert and oriented x3 mood and affect normal, dressed appropriately.  HEENT: Pupils equal, extraocular movements intact  Respiratory: Patient's speak in full sentences and does not appear short of breath  Cardiovascular: [name] lower extremity edema, non tender, [name] erythema  Skin: Warm dry intact with [name] signs of infection or rash on extremities or on axial skeleton.  Abdomen: Soft nontender  Neuro: Cranial nerves II through XII are intact, neurovascularly intact in all extremities with 2+ DTRs and 2+ pulses.  Lymph: [name] lymphadenopathy of posterior or anterior cervical chain or axillae bilaterally.  Gait normal with good balance and coordination.  MSK:  Non tender with full range of motion and good stability and symmetric strength and tone of shoulders, elbows, wrist, hip, knee and ankles bilaterally.  Foot exam shows the patient does have severe breakdown of the transverse arch bilaterally.  Patient does have bilirubinuria formation right greater than left.  Positive compression with radicular symptoms of the forefoot.  Patient does have hammering of the second and third toes   Impression and Recommendations:      The above documentation has been reviewed and is accurate and complete Lyndal Pulley, DO        Note: This dictation was prepared with Dragon dictation along with smaller phrase technology. Any transcriptional errors that result from this process are unintentional.

## 2018-11-30 ENCOUNTER — Other Ambulatory Visit: Payer: Self-pay

## 2018-11-30 ENCOUNTER — Encounter: Payer: Self-pay | Admitting: Family Medicine

## 2018-11-30 ENCOUNTER — Ambulatory Visit (INDEPENDENT_AMBULATORY_CARE_PROVIDER_SITE_OTHER): Payer: Medicare HMO | Admitting: Family Medicine

## 2018-11-30 DIAGNOSIS — M216X9 Other acquired deformities of unspecified foot: Secondary | ICD-10-CM | POA: Diagnosis not present

## 2018-11-30 NOTE — Patient Instructions (Signed)
Do not lace last eye Continue exercises Can take up to 3 IBU 2-3x a day if needed See me again in 3 months

## 2018-11-30 NOTE — Assessment & Plan Note (Signed)
Still contributing, in the orthotics, patient to get new shoes and assured patient wear pattern until he may need to be adjusted or replaced.  Patient will continue with conservative therapy otherwise.  Patient will follow-up with me again in 2 to 3 months.  If worsening symptoms consider injections

## 2018-12-02 DIAGNOSIS — H5210 Myopia, unspecified eye: Secondary | ICD-10-CM | POA: Diagnosis not present

## 2018-12-23 ENCOUNTER — Ambulatory Visit: Payer: Self-pay | Admitting: *Deleted

## 2018-12-23 NOTE — Progress Notes (Signed)
Subjective:    Patient ID: Linda [name], female    DOB: 03/01/1951, 68 y.o.   MRN: [id number]  HPI The patient is here for an acute visit.   Dizziness:   She first noticed dizziness the end of July.  It is a lightheadedness/dizziness.  We did treat her for a sinus infection early August.  She did vestibular PT with Rudell Cobb in August.  PT helped but it never went away. Now she is experiencing weird sensations, maybe pressure, in both ears intermittently.  She has lightheadedness/ off balanced feeling after she gets up from laying down and sometimes sitting.    This sensation is mild and it is not all the time.  She denies any headaches.  She denies cold symptoms, fevers.  There have been no numbness or weakness.  She denies blurry vision or nausea.   She was stung by a wasp two weeks ago on her left forehead.  It does not hurt.  She still has the bite there and was wondering it that was normal.    Soreness in right mid-lower back.  It comes and goes, maybe she feels it once a day. It is been present for a while.  She was unsure if it was related to her exercising and doing weights. Nothing aggravates it.  It is a soreness.  It is not bad enough to take anything for it.      Soreness of her scalp just on the top of her.  It is not worse after getting her hair done/colored.  She denies any flaking of the skin    Medications and allergies reviewed with patient and updated if appropriate.  Patient Active Problem List   Diagnosis Date Noted  . Dizziness 10/26/2018  . 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 Muscle soreness 08/02/2018  . Skin burn 01/01/2018  . 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 Upper airway cough syndrome 11/17/2017  . Mid back pain on right side 08/14/2017  . Acute non-recurrent maxillary sinusitis 07/02/2017  . Gastroesophageal reflux disease 07/02/2017  . History of colonic  polyps 05/22/2014  . Hyperglycemia 09/04/2013  . Allergic rhinitis 08/06/2013  . ANXIETY STATE, UNSPECIFIED 11/29/2009  . Vitamin D deficiency 04/10/2008  . Hyperlipidemia 10/04/2007  . Osteoporosis 10/04/2007    Current Outpatient Medications on File Prior to Visit  Medication Sig Dispense Refill  . Ascorbic Acid (VITAMIN C) 1000 MG tablet Take 1,000 mg by mouth daily.      . Calcium Carb-Cholecalciferol (CALCIUM 1000 + D PO) Take by mouth daily.      . cholecalciferol (VITAMIN D) 1000 units tablet Take 1.5 tablets (1,500 Units total) by mouth daily. 90 tablet 2  . tretinoin (RETIN-A) 0.05 % cream APPLY A PEASIZE AMOUNT ONTO THE FACE NIGHTLY     No current facility-administered medications on file prior to visit.     Past Medical History:  Diagnosis Date  . Cellulitis of leg, right 08/27/2016  . Hyperlipidemia   . Osteopenia   . Vitamin D deficiency     Past Surgical History:  Procedure Laterality Date  . COLONOSCOPY  03/2011   Dr Brodie;diminuitive polyp  . G 2 P 2    . TONSILLECTOMY      Social History   Socioeconomic History  . Marital status: Married    Spouse name: Not on file  . Number of children: 2  . Years  of education: Not on file  . Highest education level: Not on file  Occupational History  . Occupation: Science writer  Social Needs  . Financial resource strain: Not on file  . Food insecurity    Worry: Not on file    Inability: Not on file  . Transportation needs    Medical: Not on file    Non-medical: Not on file  Tobacco Use  . Smoking status: Never Smoker  . Smokeless tobacco: Never Used  Substance and Sexual Activity  . Alcohol use: Yes    Alcohol/week: 14.0 standard drinks    Types: 14 Glasses of wine per week    Comment:  2 wine or beer nightly  . Drug use: No  . Sexual activity: Not on file  Lifestyle  . Physical activity    Days per week: Not on file    Minutes per session: Not on file  . Stress: Not on file  Relationships  . Social  Herbalist on phone: Not on file    Gets together: Not on file    Attends religious service: Not on file    Active member of club or organization: Not on file    Attends meetings of clubs or organizations: Not on file    Relationship status: Not on file  Other Topics Concern  . Not on file  Social History Narrative   Exercise:  Yoga, weights, walking      Two children, grandchildren    Family History  Problem Relation Age of Onset  . Melanoma Father   . Hyperlipidemia Mother   . Heart disease Mother        no MI  . Diabetes Paternal Grandmother        vision loss  . Diabetes Maternal Uncle   . Colon cancer Neg Hx   . Stomach cancer Neg Hx   . Esophageal cancer Neg Hx   . Rectal cancer Neg Hx   . Stroke Neg Hx     Review of Systems  Constitutional: Negative for chills and fever.  HENT: Negative for congestion, ear pain (pressure at times), hearing loss, sinus pain and tinnitus.   Eyes: Negative for visual disturbance.  Gastrointestinal: Negative for nausea.  Genitourinary: Negative for dysuria, frequency and hematuria.  Neurological: Positive for dizziness, light-headedness and headaches (occ, normal for her - allergies). Negative for weakness and numbness.       Objective:   Vitals:   12/24/18 0751  BP: 122/74  Pulse: 77  Resp: 16  Temp: 99 F (37.2 C)  SpO2: 99%   BP Readings from Last 3 Encounters:  12/24/18 122/74  11/30/18 118/80  10/26/18 122/72   Wt Readings from Last 3 Encounters:  12/24/18 146 lb 12.8 oz (66.6 kg)  11/30/18 148 lb (67.1 kg)  10/26/18 148 lb (67.1 kg)   Body mass index is 25.6 kg/m.   Physical Exam Constitutional:      General: She is not in acute distress.    Appearance: Normal appearance. She is not ill-appearing.  HENT:     Head: Normocephalic and atraumatic.     Right Ear: Tympanic membrane, ear canal and external ear normal. There is no impacted cerumen.     Left Ear: Tympanic membrane, ear canal and  external ear normal. There is no impacted cerumen.     Nose: Nose normal.     Mouth/Throat:     Mouth: Mucous membranes are moist.     Pharynx: No posterior  oropharyngeal erythema.  Neck:     Musculoskeletal: Neck supple. No muscular tenderness.  Abdominal:     Tenderness: There is no right CVA tenderness or left CVA tenderness.  Musculoskeletal:        General: Tenderness (Mild tenderness right psoas muscle) present.  Lymphadenopathy:     Cervical: No cervical adenopathy.  Skin:    Comments: Small red papule from wasp bite left temple-nontender.  Scalp normal-appearing-no erythema or flaking  Neurological:     General: No focal deficit present.     Mental Status: She is alert.     Sensory: No sensory deficit.     Motor: No weakness.            Assessment & Plan:    See Problem List for Assessment and Plan of chronic medical problems.

## 2018-12-23 NOTE — Telephone Encounter (Signed)
Reports dizziness with vertigo that is intermittent and has been occurring about one month. She has seen Dr.Burns for this 10/26/18.  Completed PT for the vertigo but it continues. Complains of a tender scalp-not new. No N/V/CP/SOB/weakness. Occasional headache. Continues to go about her daily activities. No fever/no covid contacts/no travels. Reviewed urgent symptoms should they arise seek immediate treatment. Stated she understood. Warm transferred for appointment. Reason for Disposition . [1] MILD dizziness (e.g., vertigo; walking normally) AND [2] has NOT been evaluated by physician for this  Answer Assessment - Initial Assessment Questions 1. DESCRIPTION: "Describe your dizziness."     When she gets up and starts to move around 2. VERTIGO: "Do you feel like either you or the room is spinning or tilting?"      yes 3. LIGHTHEADED: "Do you feel lightheaded?" (e.g., somewhat faint, woozy, weak upon standing)     yes 4. SEVERITY: "How bad is it?"  "Can you walk?"   - MILD - Feels unsteady but walking normally.   - MODERATE - Feels very unsteady when walking, but not falling; interferes with normal activities (e.g., school, work) .   - SEVERE - Unable to walk without falling (requires assistance).     mild 5. ONSET:  "When did the dizziness begin?"    Dizziness with vertigo occurring some better. 6. AGGRAVATING FACTORS: "Does anything make it worse?" (e.g., standing, change in head position)      Nothing in particular. 7. CAUSE: "What do you think is causing the dizziness?"     unsure 8. RECURRENT SYMPTOM: "Have you had dizziness before?" If so, ask: "When was the last time?" "What happened that time?"     no 9. OTHER SYMPTOMS: "Do you have any other symptoms?" (e.g., headache, weakness, numbness, vomiting, earache)     Ear pain, sometimes a pain in her flank area on right, occasionally 10. PREGNANCY: "Is there any chance you are pregnant?" "When was your last menstrual period?"        no  Protocols used: DIZZINESS - VERTIGO-A-AH

## 2018-12-24 ENCOUNTER — Other Ambulatory Visit: Payer: Self-pay

## 2018-12-24 ENCOUNTER — Ambulatory Visit (INDEPENDENT_AMBULATORY_CARE_PROVIDER_SITE_OTHER): Payer: Medicare HMO | Admitting: Internal Medicine

## 2018-12-24 ENCOUNTER — Encounter: Payer: Self-pay | Admitting: Internal Medicine

## 2018-12-24 VITALS — BP 122/74 | HR 77 | Temp 99.0°F | Resp 16 | Ht 63.5 in | Wt 146.8 lb

## 2018-12-24 DIAGNOSIS — R42 Dizziness and giddiness: Secondary | ICD-10-CM | POA: Diagnosis not present

## 2018-12-24 DIAGNOSIS — R238 Other skin changes: Secondary | ICD-10-CM | POA: Diagnosis not present

## 2018-12-24 DIAGNOSIS — M791 Myalgia, unspecified site: Secondary | ICD-10-CM | POA: Diagnosis not present

## 2018-12-24 NOTE — Assessment & Plan Note (Signed)
She continues to have some mild lightheadedness/dizziness that typically occurs upon standing after laying down sometimes sitting Symptoms mild throughout the day and intermittent She did do vestibular therapy and that did help, but she still has mild symptoms that are residual that are bothersome She is hydrating throughout the day and orthostatic hypotension seems less likely Likely mild BPPV Discussed referral to an ENT, trying an allergy medication daily like Claritin and doing more PT She would like a referral for additional PT-ordered She will start taking Claritin daily to see if that helps

## 2018-12-24 NOTE — Assessment & Plan Note (Signed)
Soreness in the right psoas muscle-not kidney Reassured Can discuss with Dr. Tamala Julian

## 2018-12-24 NOTE — Patient Instructions (Signed)
Take Claritin for a couple of weeks to see if it helps.    A referral was ordered for PT - Rudell Cobb.

## 2018-12-24 NOTE — Assessment & Plan Note (Signed)
Experiencing soreness of her scalp-just on the top of her head No obvious skin issues ?  Cause I do not think it is related to her hair coloring Can see dermatology if needed

## 2019-01-13 DIAGNOSIS — H04123 Dry eye syndrome of bilateral lacrimal glands: Secondary | ICD-10-CM | POA: Diagnosis not present

## 2019-01-13 DIAGNOSIS — H2513 Age-related nuclear cataract, bilateral: Secondary | ICD-10-CM | POA: Diagnosis not present

## 2019-01-13 DIAGNOSIS — H00021 Hordeolum internum right upper eyelid: Secondary | ICD-10-CM | POA: Diagnosis not present

## 2019-02-16 ENCOUNTER — Other Ambulatory Visit: Payer: Self-pay

## 2019-02-16 ENCOUNTER — Ambulatory Visit: Payer: Self-pay

## 2019-02-16 ENCOUNTER — Encounter: Payer: Self-pay | Admitting: Family Medicine

## 2019-02-16 ENCOUNTER — Ambulatory Visit: Payer: Medicare HMO | Admitting: Family Medicine

## 2019-02-16 ENCOUNTER — Telehealth: Payer: Self-pay | Admitting: *Deleted

## 2019-02-16 VITALS — BP 126/92 | HR 75 | Ht 63.5 in | Wt 149.0 lb

## 2019-02-16 DIAGNOSIS — G8929 Other chronic pain: Secondary | ICD-10-CM

## 2019-02-16 DIAGNOSIS — S83242A Other tear of medial meniscus, current injury, left knee, initial encounter: Secondary | ICD-10-CM | POA: Insufficient documentation

## 2019-02-16 DIAGNOSIS — M25562 Pain in left knee: Secondary | ICD-10-CM

## 2019-02-16 NOTE — Patient Instructions (Signed)
Good to see you 3 Ibuprofen 3 times a day for 3 days Ice 20 mins 2 times daily  Brace with walking See me again in 4-6 weeks

## 2019-02-16 NOTE — Assessment & Plan Note (Signed)
Left medial meniscal tear noted.  Discussed with patient in great length about icing regimen and home exercises.  Patient given a hinged brace for some stability.  Patient did have a knee effusion and we discussed the possibility of injection and aspiration with patient declined.  If worsening symptoms may see patient back earlier.  X-ray should be pending.  Follow-up with me again in 4 to 6 weeks.

## 2019-02-16 NOTE — Telephone Encounter (Signed)
Pt left msg stating she would like to be seen ASAP. She stated that her leg is swollen & it is difficult for her to bear weight.

## 2019-02-16 NOTE — Telephone Encounter (Signed)
Spoke with patient. Scheduled this morning.

## 2019-02-16 NOTE — Progress Notes (Signed)
[name] [place], [place] [address] Phone: [phone] Subjective:   I Linda [name] am serving as a Education administrator for Dr. Hulan Saas.  This visit occurred during the SARS-CoV-2 public health emergency.  Safety protocols were in place, including screening questions prior to the visit, additional usage of staff PPE, and extensive cleaning of exam room while observing appropriate contact time as indicated for disinfecting solutions.    CC: Left knee pain   [id number]   11/30/2018 Still contributing, in the orthotics, patient to get new shoes and assured patient wear pattern until he may need to be adjusted or replaced.  Patient will continue with conservative therapy otherwise.  Patient will follow-up with me again in 2 to 3 months.  If worsening symptoms consider injections  02/16/2019 Linda [name] is a 68 y.o. female coming in with complaint of left knee swelling. Patient states that she was working out and having to do some exercises with bearing weight on her knee. Since then patient has had pain and swelling in the posterior aspect of knee making is hard for her to ambulate. Patient states she is still in pain in the entire leg. Some swelling. Taking Ibuprofen for pain.        Past Medical History:  Diagnosis Date  . Cellulitis of leg, right 08/27/2016  . Hyperlipidemia   . Osteopenia   . Vitamin D deficiency    Past Surgical History:  Procedure Laterality Date  . COLONOSCOPY  03/2011   Dr Brodie;diminuitive polyp  . G 2 P 2    . TONSILLECTOMY     Social History   Socioeconomic History  . Marital status: Married    Spouse name: Not on file  . Number of children: 2  . Years of education: Not on file  . Highest education level: Not on file  Occupational History  . Occupation: Science writer  Social Needs  . Financial resource strain: Not on file  . Food insecurity    Worry: Not on file    Inability: Not on file  .  Transportation needs    Medical: Not on file    Non-medical: Not on file  Tobacco Use  . Smoking status: Never Smoker  . Smokeless tobacco: Never Used  Substance and Sexual Activity  . Alcohol use: Yes    Alcohol/week: 14.0 standard drinks    Types: 14 Glasses of wine per week    Comment:  2 wine or beer nightly  . Drug use: No  . Sexual activity: Not on file  Lifestyle  . Physical activity    Days per week: Not on file    Minutes per session: Not on file  . Stress: Not on file  Relationships  . Social Herbalist on phone: Not on file    Gets together: Not on file    Attends religious service: Not on file    Active member of club or organization: Not on file    Attends meetings of clubs or organizations: Not on file    Relationship status: Not on file  Other Topics Concern  . Not on file  Social History Narrative   Exercise:  Yoga, weights, walking      Two children, grandchildren   Allergies  Allergen Reactions  . Fosamax [Alendronate] Other (See Comments)    GERD   Family History  Problem Relation Age of Onset  . Melanoma Father   . Hyperlipidemia Mother   .  Heart disease Mother        no MI  . Diabetes Paternal Grandmother        vision loss  . Diabetes Maternal Uncle   . Colon cancer Neg Hx   . Stomach cancer Neg Hx   . Esophageal cancer Neg Hx   . Rectal cancer Neg Hx   . Stroke Neg Hx          Current Outpatient Medications (Other):  Marland Kitchen  Ascorbic Acid (VITAMIN C) 1000 MG tablet, Take 1,000 mg by mouth daily.   .  Calcium Carb-Cholecalciferol (CALCIUM 1000 + D PO), Take by mouth daily.   .  cholecalciferol (VITAMIN D) 1000 units tablet, Take 1.5 tablets (1,500 Units total) by mouth daily. Marland Kitchen  tretinoin (RETIN-A) 0.05 % cream, APPLY A PEASIZE AMOUNT ONTO THE FACE NIGHTLY    Past medical history, social, surgical and family history all reviewed in electronic medical record.  No pertanent information unless stated regarding to the chief  complaint.   Review of Systems:  No headache, visual changes, nausea, vomiting, diarrhea, constipation, dizziness, abdominal pain, skin rash, fevers, chills, night sweats, weight loss, swollen lymph nodes, body aches, joint swelling,  chest pain, shortness of breath, mood changes.  Positive muscle aches  Objective  Blood pressure (!) 126/92, pulse 75, height 5' 3.5" (1.613 m), weight 149 lb (67.6 kg), SpO2 98 %.    General: No apparent distress alert and oriented x3 mood and affect normal, dressed appropriately.  HEENT: Pupils equal, extraocular movements intact  Respiratory: Patient's speak in full sentences and does not appear short of breath  Cardiovascular: No lower extremity edema, non tender, no erythema  Skin: Warm dry intact with no signs of infection or rash on extremities or on axial skeleton.  Abdomen: Soft nontender  Neuro: Cranial nerves II through XII are intact, neurovascularly intact in all extremities with 2+ DTRs and 2+ pulses.  Lymph: No lymphadenopathy of posterior or anterior cervical chain or axillae bilaterally.  Gait antalgic MSK:  tender with full range of motion and good stability and symmetric strength and tone of shoulders, elbows, wrist, hip, and ankles bilaterally.  Left knee exam does have an effusion noted.  Lacks the last 10 degrees of flexion in the last 2 degrees of extension.  Tender to palpation over the medial joint space.  Positive McMurray's noted.  Negative anterior drawer  Limited musculoskeletal ultrasound was performed and interpreted by Lyndal Pulley  Limited ultrasound of patient's left knee shows the patient does have a medial meniscal tear noted with mild displacement noted.  Patient does have effusion noted.  Patient also has small joint effusion noted. Impression: Medial meniscal tear with mild displacement effusion noted      Impression and Recommendations:     This case required medical decision making of moderate complexity. The  above documentation has been reviewed and is accurate and complete Lyndal Pulley, DO       Note: This dictation was prepared with Dragon dictation along with smaller phrase technology. Any transcriptional errors that result from this process are unintentional.

## 2019-03-01 ENCOUNTER — Ambulatory Visit: Payer: Medicare HMO | Admitting: Family Medicine

## 2019-03-16 ENCOUNTER — Other Ambulatory Visit: Payer: Self-pay

## 2019-03-16 ENCOUNTER — Ambulatory Visit (INDEPENDENT_AMBULATORY_CARE_PROVIDER_SITE_OTHER): Payer: Medicare HMO | Admitting: Family Medicine

## 2019-03-16 ENCOUNTER — Encounter: Payer: Self-pay | Admitting: Family Medicine

## 2019-03-16 DIAGNOSIS — M216X9 Other acquired deformities of unspecified foot: Secondary | ICD-10-CM

## 2019-03-16 DIAGNOSIS — S83242A Other tear of medial meniscus, current injury, left knee, initial encounter: Secondary | ICD-10-CM

## 2019-03-16 NOTE — Assessment & Plan Note (Signed)
Once again discussed over-the-counter shoes, we discussed recovery sandals, discussed which activities to potentially avoiding walking barefoot is one of them.  Worsening pain will consider injection for neuroma.

## 2019-03-16 NOTE — Patient Instructions (Addendum)
[employer] orthotics "total support" online would be great  oofos or hoka recovery sandal.  Keep being active but for cardio more bike or elliptical  See me again in 8 weeks

## 2019-03-16 NOTE — Progress Notes (Signed)
[name] [address] [place], [place] [address] Phone: [phone] Subjective:   Linda [name], am serving as a scribe for Dr. Hulan Saas.  This visit occurred during the SARS-CoV-2 public health emergency.  Safety protocols were in place, including screening questions prior to the visit, additional usage of staff PPE, and extensive cleaning of exam room while observing appropriate contact time as indicated for disinfecting solutions.  I'm seeing this patient by the request  of:    CC: Knee pain follow-up  [id number]    Left medial meniscal tear noted.  Discussed with patient in great length about icing regimen and home exercises.  Patient given a hinged brace for some stability.  Patient did have a knee effusion and we discussed the possibility of injection and aspiration with patient declined.  If worsening symptoms may see patient back earlier.  X-ray should be pending.  Follow-up with me again in 4 to 6 weeks.  Linda [name] is a 68 y.o. female coming in with complaint of L knee and foot pain. States it is getting better, being on knees hurt worse and states both legs have been aching. Been using brace, ice and taking ibuprofen. States brace is a "nightmare" can only wear at night because it feels really tight.       Past Medical History:  Diagnosis Date  . Cellulitis of leg, right 08/27/2016  . Hyperlipidemia   . Osteopenia   . Vitamin D deficiency    Past Surgical History:  Procedure Laterality Date  . COLONOSCOPY  03/2011   Dr Brodie;diminuitive polyp  . G 2 P 2    . TONSILLECTOMY     Social History   Socioeconomic History  . Marital status: Married    Spouse name: Not on file  . Number of children: 2  . Years of education: Not on file  . Highest education level: Not on file  Occupational History  . Occupation: banking  Tobacco Use  . Smoking status: Never Smoker  . Smokeless tobacco: Never Used  Substance and Sexual  Activity  . Alcohol use: Yes    Alcohol/week: 14.0 standard drinks    Types: 14 Glasses of wine per week    Comment:  2 wine or beer nightly  . Drug use: No  . Sexual activity: Not on file  Other Topics Concern  . Not on file  Social History Narrative   Exercise:  Yoga, weights, walking      Two children, grandchildren   Social Determinants of Health   Financial Resource Strain:   . Difficulty of Paying Living Expenses: Not on file  Food Insecurity:   . Worried About Charity fundraiser in the Last Year: Not on file  . Ran Out of Food in the Last Year: Not on file  Transportation Needs:   . Lack of Transportation (Medical): Not on file  . Lack of Transportation (Non-Medical): Not on file  Physical Activity:   . Days of Exercise per Week: Not on file  . Minutes of Exercise per Session: Not on file  Stress:   . Feeling of Stress : Not on file  Social Connections:   . Frequency of Communication with Friends and Family: Not on file  . Frequency of Social Gatherings with Friends and Family: Not on file  . Attends Religious Services: Not on file  . Active Member of Clubs or Organizations: Not on file  . Attends Archivist Meetings:  Not on file  . Marital Status: Not on file   Allergies  Allergen Reactions  . Fosamax [Alendronate] Other (See Comments)    GERD   Family History  Problem Relation Age of Onset  . Melanoma Father   . Hyperlipidemia Mother   . Heart disease Mother        no MI  . Diabetes Paternal Grandmother        vision loss  . Diabetes Maternal Uncle   . Colon cancer Neg Hx   . Stomach cancer Neg Hx   . Esophageal cancer Neg Hx   . Rectal cancer Neg Hx   . Stroke Neg Hx          Current Outpatient Medications (Other):  Marland Kitchen  Ascorbic Acid (VITAMIN C) 1000 MG tablet, Take 1,000 mg by mouth daily.   .  Calcium Carb-Cholecalciferol (CALCIUM 1000 + D PO), Take by mouth daily.   .  cholecalciferol (VITAMIN D) 1000 units tablet, Take 1.5  tablets (1,500 Units total) by mouth daily. Marland Kitchen  tretinoin (RETIN-A) 0.05 % cream, APPLY A PEASIZE AMOUNT ONTO THE FACE NIGHTLY    Past medical history, social, surgical and family history all reviewed in electronic medical record.  No pertanent information unless stated regarding to the chief complaint.   Review of Systems:  No headache, visual changes, nausea, vomiting, diarrhea, constipation, dizziness, abdominal pain, skin rash, fevers, chills, night sweats, weight loss, swollen lymph nodes, body aches, joint swelling, muscle aches, chest pain, shortness of breath, mood changes.   Objective  Blood pressure 120/80, pulse 72, height 5' 3.5" (1.613 m), weight 146 lb (66.2 kg), SpO2 97 %.    General: No apparent distress alert and oriented x3 mood and affect normal, dressed appropriately.  HEENT: Pupils equal, extraocular movements intact  Respiratory: Patient's speak in full sentences and does not appear short of breath  Cardiovascular: No lower extremity edema, non tender, no erythema  Skin: Warm dry intact with no signs of infection or rash on extremities or on axial skeleton.  Abdomen: Soft nontender  Neuro: Cranial nerves II through XII are intact, neurovascularly intact in all extremities with 2+ DTRs and 2+ pulses.  Lymph: No lymphadenopathy of posterior or anterior cervical chain or axillae bilaterally.  Gait normal with good balance and coordination.  MSK:  Non tender with full range of motion and good stability and symmetric strength and tone of shoulders, elbows, wrist, hip, knee and ankles bilaterally.  Knee:left  Normal to inspection with no erythema or effusion or obvious bony abnormalities. Palpation normal with no warmth, joint line tenderness, patellar tenderness, or condyle tenderness. ROM full in flexion and extension and lower leg rotation. Ligaments with solid consistent endpoints including ACL, PCL, LCL, MCL. Positive Mcmurray's, Apley's, and Thessalonian tests. Non  painful patellar compression. Patellar glide with mild crepitus. Patellar and quadriceps tendons unremarkable. Hamstring and quadriceps strength is normal.  Foot exam shows the patient does have more tenderness to palpation over the transverse arch.  Patient does have breakdown noted.  Patient has positive squeeze test mostly between the third and fourth metatarsal heads on the right side.   Impression and Recommendations:      The above documentation has been reviewed and is accurate and complete Linda Pulley, DO       Note: This dictation was prepared with Dragon dictation along with smaller phrase technology. Any transcriptional errors that result from this process are unintentional.

## 2019-03-16 NOTE — Assessment & Plan Note (Signed)
Overall seems to be doing relatively well, no locking, trace effusion still noted, discussed the possibility of injections but patient feels like she is done relatively well.  We will not change anything else significantly at the moment.  Patient will follow up again in 4 to 8 weeks.

## 2019-04-12 ENCOUNTER — Ambulatory Visit: Payer: Medicare HMO

## 2019-04-12 NOTE — Progress Notes (Unsigned)
   Covid-19 Vaccination Clinic  Name:  Linda [name]    MRN: [id number] DOB: 1950-11-13  04/12/2019  [name] was observed post Covid-19 immunization for 61mins without incidence. She was provided with Vaccine Information Sheet and instruction to access the V-Safe system.   [name] was instructed to call 911 with any severe reactions post vaccine: Marland Kitchen Difficulty breathing  . Swelling of your face and throat  . A fast heartbeat  . A bad rash all over your body  . Dizziness and weakness

## 2019-04-13 ENCOUNTER — Ambulatory Visit: Payer: Medicare HMO

## 2019-04-21 ENCOUNTER — Ambulatory Visit: Payer: Medicare HMO

## 2019-04-30 DIAGNOSIS — Z0100 Encounter for examination of eyes and vision without abnormal findings: Secondary | ICD-10-CM | POA: Diagnosis not present

## 2019-05-02 ENCOUNTER — Ambulatory Visit: Payer: Medicare HMO | Attending: Internal Medicine

## 2019-05-02 DIAGNOSIS — Z23 Encounter for immunization: Secondary | ICD-10-CM | POA: Insufficient documentation

## 2019-05-02 NOTE — Progress Notes (Signed)
   Covid-19 Vaccination Clinic  Name:  Linda [name]    MRN: [id number] DOB: [birth year]  05/02/2019  Linda [name] was observed post Covid-19 immunization for 15 minutes without incidence. She was provided with Vaccine Information Sheet and instruction to access the V-Safe system.   Linda [name] was instructed to call 911 with any severe reactions post vaccine: Marland Kitchen Difficulty breathing  . Swelling of your face and throat  . A fast heartbeat  . A bad rash all over your body  . Dizziness and weakness    Immunizations Administered    Name Date Dose VIS Date Route   Pfizer COVID-19 Vaccine 05/02/2019  9:47 AM 0.3 mL 03/04/2019 Intramuscular   Manufacturer: [place]   Lot: [id number]   [place]: [id number]

## 2019-05-11 ENCOUNTER — Ambulatory Visit: Payer: Medicare HMO | Admitting: Family Medicine

## 2019-05-30 ENCOUNTER — Encounter: Payer: Self-pay | Admitting: Obstetrics

## 2019-05-30 DIAGNOSIS — M810 Age-related osteoporosis without current pathological fracture: Secondary | ICD-10-CM | POA: Diagnosis not present

## 2019-05-30 DIAGNOSIS — M8588 Other specified disorders of bone density and structure, other site: Secondary | ICD-10-CM | POA: Diagnosis not present

## 2019-06-02 DIAGNOSIS — Z01419 Encounter for gynecological examination (general) (routine) without abnormal findings: Secondary | ICD-10-CM | POA: Diagnosis not present

## 2019-06-16 DIAGNOSIS — H1013 Acute atopic conjunctivitis, bilateral: Secondary | ICD-10-CM | POA: Diagnosis not present

## 2019-06-16 DIAGNOSIS — H2513 Age-related nuclear cataract, bilateral: Secondary | ICD-10-CM | POA: Diagnosis not present

## 2019-06-16 DIAGNOSIS — H04123 Dry eye syndrome of bilateral lacrimal glands: Secondary | ICD-10-CM | POA: Diagnosis not present

## 2019-08-02 NOTE — Progress Notes (Signed)
Subjective:    Patient ID: Linda [name], female    DOB: 18-Jan-1951, 69 y.o.   MRN: [id number]  HPI She is here for a physical exam.    She thinks her BP is elevated due to watching her granddaughter for the past 10 days.  Her BP is typically much lower.    She has lower back issues and her legs ache when she lays. Getting up is difficult from a chair - her back and legs.    Medications and allergies reviewed with patient and updated if appropriate.  Patient Active Problem List   Diagnosis Date Noted  . Acute medial meniscal tear, left, initial encounter 02/16/2019  . Scalp irritation 12/24/2018  . Dizziness 10/26/2018  . 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 Upper airway cough syndrome 11/17/2017  . Mid back pain on right side 08/14/2017  . Gastroesophageal reflux disease 07/02/2017  . History of colonic polyps 05/22/2014  . Hyperglycemia 09/04/2013  . Allergic rhinitis 08/06/2013  . ANXIETY STATE, UNSPECIFIED 11/29/2009  . Vitamin D deficiency 04/10/2008  . Osteoporosis 10/04/2007    Current Outpatient Medications on File Prior to Visit  Medication Sig Dispense Refill  . Ascorbic Acid (VITAMIN C) 1000 MG tablet Take 1,000 mg by mouth daily.      . Calcium Carb-Cholecalciferol (CALCIUM 1000 + D PO) Take by mouth daily.      . cholecalciferol (VITAMIN D) 1000 units tablet Take 1.5 tablets (1,500 Units total) by mouth daily. 90 tablet 2  . tretinoin (RETIN-A) 0.05 % cream APPLY A PEASIZE AMOUNT ONTO THE FACE NIGHTLY     No current facility-administered medications on file prior to visit.    Past Medical History:  Diagnosis Date  . Cellulitis of leg, right 08/27/2016  . Hyperlipidemia   . Osteopenia   . Vitamin D deficiency     Past Surgical History:  Procedure Laterality Date  . COLONOSCOPY  03/2011   Dr Brodie;diminuitive polyp  . G 2 P 2      . TONSILLECTOMY      Social History   Socioeconomic History  . Marital status: Married    Spouse name: Not on file  . Number of children: 2  . Years of education: Not on file  . Highest education level: Not on file  Occupational History  . Occupation: banking  Tobacco Use  . Smoking status: Never Smoker  . Smokeless tobacco: Never Used  Substance and Sexual Activity  . Alcohol use: Yes    Alcohol/week: 14.0 standard drinks    Types: 14 Glasses of wine per week    Comment:  2 wine or beer nightly  . Drug use: No  . Sexual activity: Not on file  Other Topics Concern  . Not on file  Social History Narrative   Exercise:  Yoga, weights, walking      Two children, grandchildren   Social Determinants of Health   Financial Resource Strain:   . Difficulty of Paying Living Expenses:   Food Insecurity:   . Worried About Charity fundraiser in the Last Year:   . Arboriculturist in the Last Year:   Transportation Needs:   . Film/video editor (Medical):   Marland Kitchen Lack of Transportation (Non-Medical):   Physical Activity:   . Days of Exercise per Week:   . Minutes of Exercise per Session:  Stress:   . Feeling of Stress :   Social Connections:   . Frequency of Communication with Friends and Family:   . Frequency of Social Gatherings with Friends and Family:   . Attends Religious Services:   . Active Member of Clubs or Organizations:   . Attends Archivist Meetings:   Marland Kitchen Marital Status:     Family History  Problem Relation Age of Onset  . Melanoma Father   . Hyperlipidemia Mother   . Heart disease Mother        no MI  . Diabetes Paternal Grandmother        vision loss  . Diabetes Maternal Uncle   . Colon cancer Neg Hx   . Stomach cancer Neg Hx   . Esophageal cancer Neg Hx   . Rectal cancer Neg Hx   . Stroke Neg Hx     Review of Systems  Constitutional: Negative for chills and fever.  Eyes: Negative for visual disturbance.  Respiratory: Negative for  cough, shortness of breath and wheezing.   Cardiovascular: Negative for chest pain, palpitations and leg swelling.  Gastrointestinal: Negative for abdominal pain, blood in stool, constipation, diarrhea and nausea.       No gerd  Genitourinary: Negative for dysuria and hematuria.  Musculoskeletal: Positive for back pain. Negative for arthralgias.  Skin: Negative for color change and rash.  Neurological: Negative for dizziness, light-headedness, numbness and headaches.  Psychiatric/Behavioral: Positive for dysphoric mood. The patient is not nervous/anxious.        Objective:   Vitals:   08/03/19 0854  BP: (!) 146/84  Pulse: 78  Resp: 16  Temp: 98.8 F (37.1 C)  SpO2: 99%   Filed Weights   08/03/19 0854  Weight: 144 lb 6.4 oz (65.5 kg)   Body mass index is 25.18 kg/m.  BP Readings from Last 3 Encounters:  08/03/19 (!) 146/84  03/16/19 120/80  02/16/19 (!) 126/92    Wt Readings from Last 3 Encounters:  08/03/19 144 lb 6.4 oz (65.5 kg)  03/16/19 146 lb (66.2 kg)  02/16/19 149 lb (67.6 kg)     Physical Exam Constitutional: She appears well-developed and well-nourished. No distress.  HENT:  Head: Normocephalic and atraumatic.  Right Ear: External ear normal. Normal ear canal and TM Left Ear: External ear normal.  Normal ear canal and TM Mouth/Throat: Oropharynx is clear and moist.  Eyes: Conjunctivae and EOM are normal.  Neck: Neck supple. No tracheal deviation present. No thyromegaly present.  No carotid bruit  Cardiovascular: Normal rate, regular rhythm and normal heart sounds.   No murmur heard.  No edema. Pulmonary/Chest: Effort normal and breath sounds normal. No respiratory distress. She has no wheezes. She has no rales.  Breast: deferred   Abdominal: Soft. She exhibits no distension. There is no tenderness.  Lymphadenopathy: She has no cervical adenopathy.  Skin: Skin is warm and dry. She is not diaphoretic.  Psychiatric: She has a normal mood and affect.  Her behavior is normal.        Assessment & Plan:   Physical exam: Screening blood work    ordered Immunizations  Had covid vaccine, discussed shingrix Colonoscopy  Up to date  Mammogram  Done last month Gyn  Up to date Dexa   Done last month Eye exams  Up to date  Exercise  [facility]  Normal BMI Substance abuse  none Sees derm tomorrow   See Problem List for Assessment and Plan  of chronic medical problems.     This visit occurred during the SARS-CoV-2 public health emergency.  Safety protocols were in place, including screening questions prior to the visit, additional usage of staff PPE, and extensive cleaning of exam room while observing appropriate contact time as indicated for disinfecting solutions.

## 2019-08-02 NOTE — Patient Instructions (Addendum)
Dr Layne Benton - [facility] - specializes in osteoporosis   Blood work was ordered.    All other Health Maintenance issues reviewed.   All recommended immunizations and age-appropriate screenings are up-to-date or discussed.  No immunization administered today.   Medications reviewed and updated.  Changes include :  none    Please followup in 1 year    Health Maintenance, Female Adopting a healthy lifestyle and getting preventive care are important in promoting health and wellness. Ask your health care provider about:  The right schedule for you to have regular tests and exams.  Things you can do on your own to prevent diseases and keep yourself healthy. What should I know about diet, weight, and exercise? Eat a healthy diet   Eat a diet that includes plenty of vegetables, fruits, low-fat dairy products, and lean protein.  Do not eat a lot of foods that are high in solid fats, added sugars, or sodium. Maintain a healthy weight Body mass index (BMI) is used to identify weight problems. It estimates body fat based on height and weight. Your health care provider can help determine your BMI and help you achieve or maintain a healthy weight. Get regular exercise Get regular exercise. This is one of the most important things you can do for your health. Most adults should:  Exercise for at least 150 minutes each week. The exercise should increase your heart rate and make you sweat (moderate-intensity exercise).  Do strengthening exercises at least twice a week. This is in addition to the moderate-intensity exercise.  Spend less time sitting. Even light physical activity can be beneficial. Watch cholesterol and blood lipids Have your blood tested for lipids and cholesterol at 69 years of age, then have this test every 5 years. Have your cholesterol levels checked more often if:  Your lipid or cholesterol levels are high.  You are older than 69 years of  age.  You are at high risk for heart disease. What should I know about cancer screening? Depending on your health history and family history, you may need to have cancer screening at various ages. This may include screening for:  Breast cancer.  Cervical cancer.  Colorectal cancer.  Skin cancer.  Lung cancer. What should I know about heart disease, diabetes, and high blood pressure? Blood pressure and heart disease  High blood pressure causes heart disease and increases the risk of stroke. This is more likely to develop in people who have high blood pressure readings, are of African descent, or are overweight.  Have your blood pressure checked: ? Every 3-5 years if you are 66-91 years of age. ? Every year if you are 51 years old or older. Diabetes Have regular diabetes screenings. This checks your fasting blood sugar level. Have the screening done:  Once every three years after age 54 if you are at a normal weight and have a low risk for diabetes.  More often and at a younger age if you are overweight or have a high risk for diabetes. What should I know about preventing infection? Hepatitis B If you have a higher risk for hepatitis B, you should be screened for this virus. Talk with your health care provider to find out if you are at risk for hepatitis B infection. Hepatitis C Testing is recommended for:  Everyone born from [birth year] through 1965.  Anyone with known risk factors for hepatitis C. Sexually transmitted infections (STIs)  Get screened for STIs, including gonorrhea and chlamydia, if: ?  You are sexually active and are younger than 69 years of age. ? You are older than 69 years of age and your health care provider tells you that you are at risk for this type of infection. ? Your sexual activity has changed since you were last screened, and you are at increased risk for chlamydia or gonorrhea. Ask your health care provider if you are at risk.  Ask your health care  provider about whether you are at high risk for HIV. Your health care provider may recommend a prescription medicine to help prevent HIV infection. If you choose to take medicine to prevent HIV, you should first get tested for HIV. You should then be tested every 3 months for as long as you are taking the medicine. Pregnancy  If you are about to stop having your period (premenopausal) and you may become pregnant, seek counseling before you get pregnant.  Take 400 to 800 micrograms (mcg) of folic acid every day if you become pregnant.  Ask for birth control (contraception) if you want to prevent pregnancy. Osteoporosis and menopause Osteoporosis is a disease in which the bones lose minerals and strength with aging. This can result in bone fractures. If you are 67 years old or older, or if you are at risk for osteoporosis and fractures, ask your health care provider if you should:  Be screened for bone loss.  Take a calcium or vitamin D supplement to lower your risk of fractures.  Be given hormone replacement therapy (HRT) to treat symptoms of menopause. Follow these instructions at home: Lifestyle  Do not use any products that contain nicotine or tobacco, such as cigarettes, e-cigarettes, and chewing tobacco. If you need help quitting, ask your health care provider.  Do not use street drugs.  Do not share needles.  Ask your health care provider for help if you need support or information about quitting drugs. Alcohol use  Do not drink alcohol if: ? Your health care provider tells you not to drink. ? You are pregnant, may be pregnant, or are planning to become pregnant.  If you drink alcohol: ? Limit how much you use to 0-1 drink a day. ? Limit intake if you are breastfeeding.  Be aware of how much alcohol is in your drink. In the [place] one drink equals one 12 oz bottle of beer (355 mL), one 5 oz glass of wine (148 mL), or one 1 oz glass of hard liquor (44 mL). General  instructions  Schedule regular health, dental, and eye exams.  Stay current with your vaccines.  Tell your health care provider if: ? You often feel depressed. ? You have ever been abused or do not feel safe at home. Summary  Adopting a healthy lifestyle and getting preventive care are important in promoting health and wellness.  Follow your health care provider's instructions about healthy diet, exercising, and getting tested or screened for diseases.  Follow your health care provider's instructions on monitoring your cholesterol and blood pressure. This information is not intended to replace advice given to you by your health care provider. Make sure you discuss any questions you have with your health care provider. Document Revised: 03/03/2018 Document Reviewed: 03/03/2018 Elsevier Patient Education  2020 [employer].

## 2019-08-03 ENCOUNTER — Other Ambulatory Visit: Payer: Self-pay

## 2019-08-03 ENCOUNTER — Encounter: Payer: Self-pay | Admitting: Internal Medicine

## 2019-08-03 ENCOUNTER — Ambulatory Visit (INDEPENDENT_AMBULATORY_CARE_PROVIDER_SITE_OTHER): Payer: Medicare HMO | Admitting: Internal Medicine

## 2019-08-03 ENCOUNTER — Ambulatory Visit (INDEPENDENT_AMBULATORY_CARE_PROVIDER_SITE_OTHER): Payer: Medicare HMO

## 2019-08-03 VITALS — BP 146/84 | HR 78 | Temp 98.8°F | Ht 64.0 in | Wt 144.0 lb

## 2019-08-03 VITALS — BP 146/84 | HR 78 | Temp 98.8°F | Resp 16 | Ht 63.5 in | Wt 144.4 lb

## 2019-08-03 DIAGNOSIS — E559 Vitamin D deficiency, unspecified: Secondary | ICD-10-CM

## 2019-08-03 DIAGNOSIS — M810 Age-related osteoporosis without current pathological fracture: Secondary | ICD-10-CM

## 2019-08-03 DIAGNOSIS — R739 Hyperglycemia, unspecified: Secondary | ICD-10-CM

## 2019-08-03 DIAGNOSIS — Z0000 Encounter for general adult medical examination without abnormal findings: Secondary | ICD-10-CM

## 2019-08-03 LAB — COMPREHENSIVE METABOLIC PANEL
ALT: 11 U/L (ref 0–35)
AST: 17 U/L (ref 0–37)
Albumin: 4.6 g/dL (ref 3.5–5.2)
Alkaline Phosphatase: 52 U/L (ref 39–117)
BUN: 15 mg/dL (ref 6–23)
CO2: 31 mEq/L (ref 19–32)
Calcium: 10 mg/dL (ref 8.4–10.5)
Chloride: 101 mEq/L (ref 96–112)
Creatinine, Ser: 0.57 mg/dL (ref 0.40–1.20)
GFR: 105.17 mL/min (ref >60.00)
Glucose, Bld: 127 mg/dL — ABNORMAL HIGH (ref 70–99)
Potassium: 4.7 mEq/L (ref 3.5–5.1)
Sodium: 138 mEq/L (ref 135–145)
Total Bilirubin: 0.6 mg/dL (ref 0.2–1.2)
Total Protein: 7.3 g/dL (ref 6.0–8.3)

## 2019-08-03 LAB — CBC WITH DIFFERENTIAL/PLATELET
Basophils Absolute: 0 10*3/uL (ref 0.0–0.1)
Basophils Relative: 0.3 % (ref 0.0–3.0)
Eosinophils Absolute: 0.1 10*3/uL (ref 0.0–0.7)
Eosinophils Relative: 1.6 % (ref 0.0–5.0)
HCT: 40.5 % (ref 36.0–46.0)
Hemoglobin: 13.9 g/dL (ref 12.0–15.0)
Lymphocytes Relative: 29.3 % (ref 12.0–46.0)
Lymphs Abs: 1.4 10*3/uL (ref 0.7–4.0)
MCHC: 34.3 g/dL (ref 30.0–36.0)
MCV: 87.7 fl (ref 78.0–100.0)
Monocytes Absolute: 0.4 10*3/uL (ref 0.1–1.0)
Monocytes Relative: 8.5 % (ref 3.0–12.0)
Neutro Abs: 2.9 10*3/uL (ref 1.4–7.7)
Neutrophils Relative %: 60.3 % (ref 43.0–77.0)
Platelets: 239 10*3/uL (ref 150.0–400.0)
RBC: 4.62 Mil/uL (ref 3.87–5.11)
RDW: 13.3 % (ref 11.5–15.5)
WBC: 4.8 10*3/uL (ref 4.0–10.5)

## 2019-08-03 LAB — LIPID PANEL
Cholesterol: 224 mg/dL — ABNORMAL HIGH (ref 0–200)
HDL: 84.4 mg/dL (ref >39.00)
LDL Cholesterol: 124 mg/dL — ABNORMAL HIGH (ref 0–99)
NonHDL: 139.68
Total CHOL/HDL Ratio: 3
Triglycerides: 79 mg/dL (ref 0.0–149.0)
VLDL: 15.8 mg/dL (ref 0.0–40.0)

## 2019-08-03 LAB — VITAMIN D 25 HYDROXY (VIT D DEFICIENCY, FRACTURES): VITD: 31.35 ng/mL (ref 30.00–100.00)

## 2019-08-03 LAB — HEMOGLOBIN A1C: Hgb A1c MFr Bld: 5.5 % (ref 4.6–6.5)

## 2019-08-03 LAB — TSH: TSH: 1.77 u[IU]/mL (ref 0.35–4.50)

## 2019-08-03 NOTE — Assessment & Plan Note (Signed)
Taking vitamin d daily, has OP Check level

## 2019-08-03 NOTE — Progress Notes (Signed)
Subjective:   Linda [name] is a 69 y.o. female who presents for an Initial Medicare Annual Wellness Visit.  Review of Systems    No ROS. Medicare Wellness Visit. Additional risk factors are reflected in social history. Cardiac Risk Factors include: dyslipidemia;advanced age (>48men, >44 women)  Sleep Patterns: Issues falling asleep;  feels rested on waking and sleeps 6 hours nightly. Home Safety/Smoke Alarms: Feels safe in home; uses home alarm. Smoke alarms in place. Living environment: 2-story home. Lives with her husband; no needs for DME, good support system. Seat Belt Safety/Bike Helmet: Wears seat belt.    Objective:    Today's Vitals   08/03/19 0953 08/03/19 0954  BP: (!) 146/84   Pulse: 78   Temp: 98.8 F (37.1 C)   SpO2: 99%   Weight: 144 lb (65.3 kg)   Height: 5\' 4"  (1.626 m)   PainSc: 0-No pain 0-No pain   Body mass index is 24.72 kg/m.  Advanced Directives 08/03/2019  Does Patient Have a Medical Advance Directive? No  Would patient like information on creating a medical advance directive? No - Patient declined    Current Medications (verified) Outpatient Encounter Medications as of 08/03/2019  Medication Sig  . Ascorbic Acid (VITAMIN C) 1000 MG tablet Take 1,000 mg by mouth daily.    . Calcium Carb-Cholecalciferol (CALCIUM 1000 + D PO) Take by mouth daily.    . cholecalciferol (VITAMIN D) 1000 units tablet Take 1.5 tablets (1,500 Units total) by mouth daily.  Marland Kitchen tretinoin (RETIN-A) 0.05 % cream APPLY A PEASIZE AMOUNT ONTO THE FACE NIGHTLY   No facility-administered encounter medications on file as of 08/03/2019.    Allergies (verified) Fosamax [alendronate]   History: Past Medical History:  Diagnosis Date  . Cellulitis of leg, right 08/27/2016  . Hyperlipidemia   . Osteopenia   . Vitamin D deficiency    Past Surgical History:  Procedure Laterality Date  . COLONOSCOPY  03/2011   Dr Brodie;diminuitive polyp  . G 2 P 2    . TONSILLECTOMY      Family History  Problem Relation Age of Onset  . Melanoma Father   . Hyperlipidemia Mother   . Heart disease Mother        no MI  . Diabetes Paternal Grandmother        vision loss  . Diabetes Maternal Uncle   . Colon cancer Neg Hx   . Stomach cancer Neg Hx   . Esophageal cancer Neg Hx   . Rectal cancer Neg Hx   . Stroke Neg Hx    Social History   Socioeconomic History  . Marital status: Married    Spouse name: Not on file  . Number of children: 2  . Years of education: Not on file  . Highest education level: Not on file  Occupational History  . Occupation: banking  Tobacco Use  . Smoking status: Never Smoker  . Smokeless tobacco: Never Used  Substance and Sexual Activity  . Alcohol use: Yes    Alcohol/week: 14.0 standard drinks    Types: 14 Glasses of wine per week    Comment:  2 wine or beer nightly  . Drug use: No  . Sexual activity: Not on file  Other Topics Concern  . Not on file  Social History Narrative   Exercise:  Yoga, weights, walking      Two children, grandchildren   Social Determinants of Health   Financial Resource Strain:   . Difficulty of  Paying Living Expenses:   Food Insecurity:   . Worried About Charity fundraiser in the Last Year:   . Arboriculturist in the Last Year:   Transportation Needs:   . Film/video editor (Medical):   Marland Kitchen Lack of Transportation (Non-Medical):   Physical Activity:   . Days of Exercise per Week:   . Minutes of Exercise per Session:   Stress:   . Feeling of Stress :   Social Connections:   . Frequency of Communication with Friends and Family:   . Frequency of Social Gatherings with Friends and Family:   . Attends Religious Services:   . Active Member of Clubs or Organizations:   . Attends Archivist Meetings:   Marland Kitchen Marital Status:     Tobacco Counseling Counseling given: No   Clinical Intake:  Pre-visit preparation completed: Yes  Pain : No/denies pain Pain Score: 0-No pain      BMI - recorded: 25.17 Nutritional Status: BMI 25 -29 Overweight Nutritional Risks: None Diabetes: No  How often do you need to have someone help you when you read instructions, pamphlets, or other written materials from your doctor or pharmacy?: 1 - Never What is the last grade level you completed in school?: 2 years of college  Interpreter Needed?: No  Information entered by :: Charmane Protzman N. Tekeya Geffert, LPN   Activities of Daily Living In your present state of health, do you have any difficulty performing the following activities: 08/03/2019  Hearing? N  Vision? N  Difficulty concentrating or making decisions? N  Walking or climbing stairs? N  Dressing or bathing? N  Doing errands, shopping? N  Preparing Food and eating ? N  Using the Toilet? N  In the past six months, have you accidently leaked urine? N  Do you have problems with loss of bowel control? N  Managing your Medications? N  Managing your Finances? N  Housekeeping or managing your Housekeeping? N  Some recent data might be hidden     Immunizations and Health Maintenance Immunization History  Administered Date(s) Administered  . Fluad Quad(high Dose 65+) 11/18/2018  . Influenza, High Dose Seasonal PF 04/30/2016, 12/06/2016, 12/15/2017  . Influenza,inj,Quad PF,6+ Mos 01/19/2015  . Influenza-Unspecified 12/22/2013, 12/06/2016  . PFIZER SARS-COV-2 Vaccination 04/12/2019, 05/02/2019  . Pneumococcal Conjugate-13 10/17/2015  . Pneumococcal Polysaccharide-23 01/27/2017  . Td 11/22/2004  . Tdap 10/03/2015  . Zoster 10/03/2015   Health Maintenance Due  Topic Date Due  . DEXA SCAN  05/27/2019    Patient Care Team: Binnie Rail, MD as PCP - General (Internal Medicine)  Indicate any recent Medical Services you may have received from other than [facility] providers in the past year (date may be approximate).     Assessment:   This is a routine wellness examination for Linda [name].  Hearing/Vision screen No exam data  present  Dietary issues and exercise activities discussed: Current Exercise Habits: Home exercise routine(home exercise class), Type of exercise: walking;Other - see comments(TV exercises), Time (Minutes): 50, Frequency (Times/Week): 3, Weekly Exercise (Minutes/Week): 150, Intensity: Moderate, Exercise limited by: None identified  Goals    . Client understands the importance of follow-up with providers by attending scheduled visits    . Lose weight and continue to stay active.      Depression Screen PHQ 2/9 Scores 08/03/2019 01/27/2017 03/26/2016  PHQ - 2 Score 0 0 0    Fall Risk Fall Risk  08/03/2019 08/03/2019 01/27/2017 03/26/2016  Falls in the past year?  0 0 No No  Number falls in past yr: 0 0 - -  Injury with Fall? 0 0 - -  Risk for fall due to : No Fall Risks - - -  Follow up Falls evaluation completed;Education provided;Falls prevention discussed Falls evaluation completed - -    Is the patient's home free of loose throw rugs in walkways, pet beds, electrical cords, etc?   yes      Grab bars in the bathroom? no      Handrails on the stairs?   yes      Adequate lighting?   yes   Cognitive Function:     6CIT Screen 08/03/2019  What Year? 0 points  What month? 0 points  What time? 0 points  Count back from 20 0 points  Months in reverse 0 points  Repeat phrase 0 points  Total Score 0    Screening Tests Health Maintenance  Topic Date Due  . DEXA SCAN  05/27/2019  . INFLUENZA VACCINE  10/23/2019  . MAMMOGRAM  08/19/2020  . COLONOSCOPY  04/17/2021  . TETANUS/TDAP  10/02/2025  . COVID-19 Vaccine  Completed  . Hepatitis C Screening  Completed  . PNA vac Low Risk Adult  Completed    Qualifies for Shingles Vaccine? Yes, will check with local pharmacy for vaccination.  Cancer Screenings: Lung: Low Dose CT Chest recommended if Age 110-80 years, 30 pack-year currently smoking OR have quit w/in 15years. Patient does not qualify. Breast: Up to date on Mammogram? Yes   Up to  date of Bone Density/Dexa? Yes Colorectal: Yes    Plan:     Reviewed health maintenance screenings with patient today and relevant education, vaccines, and/or referrals were provided.    Continue doing brain stimulating activities (puzzles, reading, adult coloring books, staying active) to keep memory sharp.    Continue to eat heart healthy diet (full of fruits, vegetables, whole grains, lean protein, water--limit salt, fat, and sugar intake) and increase physical activity as tolerated.  I have personally reviewed and noted the following in the patient's chart:   . Medical and social history . Use of alcohol, tobacco or illicit drugs  . Current medications and supplements . Functional ability and status . Nutritional status . Physical activity . Advanced directives . List of other physicians . Hospitalizations, surgeries, and ER visits in previous 12 months . Vitals . Screenings to include cognitive, depression, and falls . Referrals and appointments  In addition, I have reviewed and discussed with patient certain preventive protocols, quality metrics, and best practice recommendations. A written personalized care plan for preventive services as well as general preventive health recommendations were provided to patient.     Sheral Flow, LPN   [id number]  Nurse Health Advisor

## 2019-08-03 NOTE — Assessment & Plan Note (Signed)
a1c

## 2019-08-03 NOTE — Patient Instructions (Addendum)
Linda [name] , Thank you for taking time to come for your Medicare Wellness Visit. I appreciate your ongoing commitment to your health goals. Please review the following plan we discussed and let me know if I can assist you in the future.   Screening recommendations/referrals: Colorectal Screening: last done on 04/18/2011 Mammogram: last done 08/20/2018 Bone Density: last done on 05/30/2019  Vision and Dental Exams: Recommended annual ophthalmology exams for early detection of glaucoma and other disorders of the eye Recommended annual dental exams for proper oral hygiene  Vaccinations: Influenza vaccine: last done on 11/18/2018 Pneumococcal vaccine: completed; last done on 10/17/2015, 01/27/2017 Tdap vaccine: last done on 10/03/2015; Due every 10 years. Shingles vaccine: Please call your insurance company to determine your out of pocket expense for the Shingrix vaccine. You may receive this vaccine at your local pharmacy. Covid vaccine: Pifizer 05/02/2019; need second dose  Advanced directives: Advance directives discussed with you today. You have declined to receive documents for completion.  Goals:  Recommend to drink at least 6-8 8oz glasses of water per day.  Recommend to exercise for at least 150 minutes per week.  Recommend to remove any items from the home that may cause slips or trips.  Recommend to decrease portion sizes by eating 3 small healthy meals and at least 2 healthy snacks per day.  Recommend to begin DASH diet as directed below  Recommend to continue efforts to reduce smoking habits until no longer smoking. Smoking Cessation literature is attached below.  Next appointment: Please schedule your Annual Wellness Visit with your Nurse Health Advisor in one year.  Preventive Care 38 Years and Older, Female Preventive care refers to lifestyle choices and visits with your health care provider that can promote health and wellness. What does preventive care include?  A yearly  physical exam. This is also called an annual well check.  Dental exams once or twice a year.  Routine eye exams. Ask your health care provider how often you should have your eyes checked.  Personal lifestyle choices, including:  Daily care of your teeth and gums.  Regular physical activity.  Eating a healthy diet.  Avoiding tobacco and drug use.  Limiting alcohol use.  Practicing safe sex.  Taking low-dose aspirin every day if recommended by your health care provider.  Taking vitamin and mineral supplements as recommended by your health care provider. What happens during an annual well check? The services and screenings done by your health care provider during your annual well check will depend on your age, overall health, lifestyle risk factors, and family history of disease. Counseling  Your health care provider may ask you questions about your:  Alcohol use.  Tobacco use.  Drug use.  Emotional well-being.  Home and relationship well-being.  Sexual activity.  Eating habits.  History of falls.  Memory and ability to understand (cognition).  Work and work Statistician.  Reproductive health. Screening  You may have the following tests or measurements:  Height, weight, and BMI.  Blood pressure.  Lipid and cholesterol levels. These may be checked every 5 years, or more frequently if you are over 42 years old.  Skin check.  Lung cancer screening. You may have this screening every year starting at age 52 if you have a 30-pack-year history of smoking and currently smoke or have quit within the past 15 years.  Fecal occult blood test (FOBT) of the stool. You may have this test every year starting at age 13.  Flexible sigmoidoscopy or colonoscopy.  You may have a sigmoidoscopy every 5 years or a colonoscopy every 10 years starting at age 42.  Hepatitis C blood test.  Hepatitis B blood test.  Sexually transmitted disease (STD) testing.  Diabetes screening.  This is done by checking your blood sugar (glucose) after you have not eaten for a while (fasting). You may have this done every 1-3 years.  Bone density scan. This is done to screen for osteoporosis. You may have this done starting at age 31.  Mammogram. This may be done every 1-2 years. Talk to your health care provider about how often you should have regular mammograms. Talk with your health care provider about your test results, treatment options, and if necessary, the need for more tests. Vaccines  Your health care provider may recommend certain vaccines, such as:  Influenza vaccine. This is recommended every year.  Tetanus, diphtheria, and acellular pertussis (Tdap, Td) vaccine. You may need a Td booster every 10 years.  Zoster vaccine. You may need this after age 61.  Pneumococcal 13-valent conjugate (PCV13) vaccine. One dose is recommended after age 63.  Pneumococcal polysaccharide (PPSV23) vaccine. One dose is recommended after age 70. Talk to your health care provider about which screenings and vaccines you need and how often you need them. This information is not intended to replace advice given to you by your health care provider. Make sure you discuss any questions you have with your health care provider. Document Released: 04/06/2015 Document Revised: 11/28/2015 Document Reviewed: 01/09/2015 Elsevier Interactive Patient Education  2017 [employer] Prevention in the Home Falls can cause injuries. They can happen to people of all ages. There are many things you can do to make your home safe and to help prevent falls. What can I do on the outside of my home?  Regularly fix the edges of walkways and driveways and fix any cracks.  Remove anything that might make you trip as you walk through a door, such as a raised step or threshold.  Trim any bushes or trees on the path to your home.  Use bright outdoor lighting.  Clear any walking paths of anything that might  make someone trip, such as rocks or tools.  Regularly check to see if handrails are loose or broken. Make sure that both sides of any steps have handrails.  Any raised decks and porches should have guardrails on the edges.  Have any leaves, snow, or ice cleared regularly.  Use sand or salt on walking paths during winter.  Clean up any spills in your garage right away. This includes oil or grease spills. What can I do in the bathroom?  Use night lights.  Install grab bars by the toilet and in the tub and shower. Do not use towel bars as grab bars.  Use non-skid mats or decals in the tub or shower.  If you need to sit down in the shower, use a plastic, non-slip stool.  Keep the floor dry. Clean up any water that spills on the floor as soon as it happens.  Remove soap buildup in the tub or shower regularly.  Attach bath mats securely with double-sided non-slip rug tape.  Do not have throw rugs and other things on the floor that can make you trip. What can I do in the bedroom?  Use night lights.  Make sure that you have a light by your bed that is easy to reach.  Do not use any sheets or blankets that are too big  for your bed. They should not hang down onto the floor.  Have a firm chair that has side arms. You can use this for support while you get dressed.  Do not have throw rugs and other things on the floor that can make you trip. What can I do in the kitchen?  Clean up any spills right away.  Avoid walking on wet floors.  Keep items that you use a lot in easy-to-reach places.  If you need to reach something above you, use a strong step stool that has a grab bar.  Keep electrical cords out of the way.  Do not use floor polish or wax that makes floors slippery. If you must use wax, use non-skid floor wax.  Do not have throw rugs and other things on the floor that can make you trip. What can I do with my stairs?  Do not leave any items on the stairs.  Make sure  that there are handrails on both sides of the stairs and use them. Fix handrails that are broken or loose. Make sure that handrails are as long as the stairways.  Check any carpeting to make sure that it is firmly attached to the stairs. Fix any carpet that is loose or worn.  Avoid having throw rugs at the top or bottom of the stairs. If you do have throw rugs, attach them to the floor with carpet tape.  Make sure that you have a light switch at the top of the stairs and the bottom of the stairs. If you do not have them, ask someone to add them for you. What else can I do to help prevent falls?  Wear shoes that:  Do not have high heels.  Have rubber bottoms.  Are comfortable and fit you well.  Are closed at the toe. Do not wear sandals.  If you use a stepladder:  Make sure that it is fully opened. Do not climb a closed stepladder.  Make sure that both sides of the stepladder are locked into place.  Ask someone to hold it for you, if possible.  Clearly mark and make sure that you can see:  Any grab bars or handrails.  First and last steps.  Where the edge of each step is.  Use tools that help you move around (mobility aids) if they are needed. These include:  Canes.  Walkers.  Scooters.  Crutches.  Turn on the lights when you go into a dark area. Replace any light bulbs as soon as they burn out.  Set up your furniture so you have a clear path. Avoid moving your furniture around.  If any of your floors are uneven, fix them.  If there are any pets around you, be aware of where they are.  Review your medicines with your doctor. Some medicines can make you feel dizzy. This can increase your chance of falling. Ask your doctor what other things that you can do to help prevent falls. This information is not intended to replace advice given to you by your health care provider. Make sure you discuss any questions you have with your health care provider. Document  Released: 01/04/2009 Document Revised: 08/16/2015 Document Reviewed: 04/14/2014 Elsevier Interactive Patient Education  2017 [employer].

## 2019-08-03 NOTE — Assessment & Plan Note (Signed)
Chronic Just had dexa last month - has OP and should be on treatment Will obtain report and review and look into cost of prolia - does not tolerate oral bisphosphonate ( GERD) Exercising Taking vitamin d and calcium

## 2019-08-04 ENCOUNTER — Encounter: Payer: Self-pay | Admitting: Internal Medicine

## 2019-08-04 DIAGNOSIS — L821 Other seborrheic keratosis: Secondary | ICD-10-CM | POA: Diagnosis not present

## 2019-08-04 DIAGNOSIS — D2272 Melanocytic nevi of left lower limb, including hip: Secondary | ICD-10-CM | POA: Diagnosis not present

## 2019-08-04 DIAGNOSIS — D225 Melanocytic nevi of trunk: Secondary | ICD-10-CM | POA: Diagnosis not present

## 2019-08-04 DIAGNOSIS — D2239 Melanocytic nevi of other parts of face: Secondary | ICD-10-CM | POA: Diagnosis not present

## 2019-08-04 DIAGNOSIS — D2262 Melanocytic nevi of left upper limb, including shoulder: Secondary | ICD-10-CM | POA: Diagnosis not present

## 2019-08-04 DIAGNOSIS — L720 Epidermal cyst: Secondary | ICD-10-CM | POA: Diagnosis not present

## 2019-08-04 DIAGNOSIS — D692 Other nonthrombocytopenic purpura: Secondary | ICD-10-CM | POA: Diagnosis not present

## 2019-08-04 DIAGNOSIS — D2261 Melanocytic nevi of right upper limb, including shoulder: Secondary | ICD-10-CM | POA: Diagnosis not present

## 2019-08-04 DIAGNOSIS — D2271 Melanocytic nevi of right lower limb, including hip: Secondary | ICD-10-CM | POA: Diagnosis not present

## 2019-08-05 ENCOUNTER — Ambulatory Visit: Payer: Self-pay

## 2019-08-05 ENCOUNTER — Encounter: Payer: Self-pay | Admitting: Family Medicine

## 2019-08-05 ENCOUNTER — Ambulatory Visit (INDEPENDENT_AMBULATORY_CARE_PROVIDER_SITE_OTHER)
Admission: RE | Admit: 2019-08-05 | Discharge: 2019-08-05 | Disposition: A | Discharge status: Home / Self Care | Payer: Medicare HMO | Source: Ambulatory Visit | Attending: Family Medicine | Admitting: Family Medicine

## 2019-08-05 ENCOUNTER — Ambulatory Visit: Payer: Medicare HMO | Admitting: Family Medicine

## 2019-08-05 ENCOUNTER — Other Ambulatory Visit: Payer: Self-pay

## 2019-08-05 VITALS — BP 118/74 | HR 86 | Ht 64.0 in | Wt 145.0 lb

## 2019-08-05 DIAGNOSIS — M545 Low back pain, unspecified: Secondary | ICD-10-CM

## 2019-08-05 DIAGNOSIS — G8929 Other chronic pain: Secondary | ICD-10-CM

## 2019-08-05 DIAGNOSIS — M216X9 Other acquired deformities of unspecified foot: Secondary | ICD-10-CM | POA: Diagnosis not present

## 2019-08-05 DIAGNOSIS — M79671 Pain in right foot: Secondary | ICD-10-CM

## 2019-08-05 DIAGNOSIS — M533 Sacrococcygeal disorders, not elsewhere classified: Secondary | ICD-10-CM | POA: Diagnosis not present

## 2019-08-05 MED ORDER — PREDNISONE 50 MG PO TABS
ORAL_TABLET | ORAL | 0 refills | Status: DC
Start: 2019-08-05 — End: 2019-11-02

## 2019-08-05 NOTE — Patient Instructions (Addendum)
Xray at [place] Prednisone 5 days PT [place] Recovery sandals HOKA or OOFOS Try shoes without corrections Look at [email or web address] See me in 4-5 weeks

## 2019-08-05 NOTE — Assessment & Plan Note (Signed)
Continuing to have difficulty.  X-rays ordered today.  Referral to formal physical therapy.  Chronic problem with exacerbation.  Prednisone given for breakthrough pain.  May need to consider the possibility of advanced imaging if this is continuing especially with history of osteoporosis may need to rule out venous insufficiency fracture.

## 2019-08-05 NOTE — Assessment & Plan Note (Signed)
Encouraged changes in his shoes.  Patient is going to recovery sandals.  Will discuss with physical therapy about strengthening of the transverse arch as well.  We discussed the possibility of gabapentin which patient declined.  Worsening pain will consider injection.

## 2019-08-05 NOTE — Progress Notes (Signed)
[place] Phone: [phone] Subjective:   Linda [name], am serving as a scribe for Dr. Hulan Saas. This visit occurred during the SARS-CoV-2 public health emergency.  Safety protocols were in place, including screening questions prior to the visit, additional usage of staff PPE, and extensive cleaning of exam room while observing appropriate contact time as indicated for disinfecting solutions.   I'm seeing this patient by the request  of:  Binnie Rail, MD  CC: Back pain and foot pain follow-up  [id number]   03/16/2019 Once again discussed over-the-counter shoes, we discussed recovery sandals, discussed which activities to potentially avoiding walking barefoot is one of them.  Worsening pain will consider injection for neuroma.  Overall seems to be doing relatively well, [name] locking, trace effusion still noted, discussed the possibility of injections but patient feels like she is done relatively well.  We will not change anything else significantly at the moment.  Patient will follow up again in 4 to 8 weeks.  Update 08/05/2019 Linda [name] is a 69 y.o. female coming in with complaint of back and right foot pain. Patient states that she is having pain for one week over the base of the 5th. Also having achiness in back and quads. Back pain is getting worse as in the pain is increasing. Does have pain with yoga and barre. Pain with lumbar flexion and sit to stand.      Past Medical History:  Diagnosis Date  . Cellulitis of leg, right 08/27/2016  . Hyperlipidemia   . Osteopenia   . Vitamin D deficiency    Past Surgical History:  Procedure Laterality Date  . COLONOSCOPY  03/2011   Dr Brodie;diminuitive polyp  . G 2 P 2    . TONSILLECTOMY     Social History   Socioeconomic History  . Marital status: Married    Spouse name: Not on file  . Number of children: 2  . Years of education: Not on file    . Highest education level: Not on file  Occupational History  . Occupation: banking  Tobacco Use  . Smoking status: Never Smoker  . Smokeless tobacco: Never Used  Substance and Sexual Activity  . Alcohol use: Yes    Alcohol/week: 14.0 standard drinks    Types: 14 Glasses of wine per week    Comment:  2 wine or beer nightly  . Drug use: [name]  . Sexual activity: Not on file  Other Topics Concern  . Not on file  Social History Narrative   Exercise:  Yoga, weights, walking      Two children, grandchildren   Social Determinants of Health   Financial Resource Strain:   . Difficulty of Paying Living Expenses:   Food Insecurity:   . Worried About Charity fundraiser in the Last Year:   . Arboriculturist in the Last Year:   Transportation Needs:   . Film/video editor (Medical):   Marland Kitchen Lack of Transportation (Non-Medical):   Physical Activity:   . Days of Exercise per Week:   . Minutes of Exercise per Session:   Stress:   . Feeling of Stress :   Social Connections:   . Frequency of Communication with Friends and Family:   . Frequency of Social Gatherings with Friends and Family:   . Attends Religious Services:   . Active Member of Clubs or Organizations:   . Attends Club or  Organization Meetings:   Marland Kitchen Marital Status:    Allergies  Allergen Reactions  . Fosamax [Alendronate] Other (See Comments)    GERD   Family History  Problem Relation Age of Onset  . Melanoma Father   . Hyperlipidemia Mother   . Heart disease Mother        [name] MI  . Diabetes Paternal Grandmother        vision loss  . Diabetes Maternal Uncle   . Colon cancer Neg Hx   . Stomach cancer Neg Hx   . Esophageal cancer Neg Hx   . Rectal cancer Neg Hx   . Stroke Neg Hx     Current Outpatient Medications (Endocrine & Metabolic):  .  predniSONE (DELTASONE) 50 MG tablet, Take one tablet daily for the next 5 days.      Current Outpatient Medications (Other):  Marland Kitchen  Ascorbic Acid (VITAMIN C) 1000 MG  tablet, Take 1,000 mg by mouth daily.   .  Calcium Carb-Cholecalciferol (CALCIUM 1000 + D PO), Take by mouth daily.   .  cholecalciferol (VITAMIN D) 1000 units tablet, Take 1.5 tablets (1,500 Units total) by mouth daily. Marland Kitchen  tretinoin (RETIN-A) 0.05 % cream, APPLY A PEASIZE AMOUNT ONTO THE FACE NIGHTLY   Reviewed prior external information including notes and imaging from  primary care provider As well as notes that were available from care everywhere and other healthcare systems.  Past medical history, social, surgical and family history all reviewed in electronic medical record.  [name] pertanent information unless stated regarding to the chief complaint.   Review of Systems:  [name] headache, visual changes, nausea, vomiting, diarrhea, constipation, dizziness, abdominal pain, skin rash, fevers, chills, night sweats, weight loss, swollen lymph nodes, body aches, joint swelling, chest pain, shortness of breath, mood changes. POSITIVE muscle aches  Objective  Blood pressure 118/74, pulse 86, height 5\' 4"  (1.626 m), weight 145 lb (65.8 kg), SpO2 98 %.   General: [name] apparent distress alert and oriented x3 mood and affect normal, dressed appropriately.  HEENT: Pupils equal, extraocular movements intact  Respiratory: Patient's speak in full sentences and does not appear short of breath  Cardiovascular: [name] lower extremity edema, non tender, [name] erythema  Neuro: Cranial nerves II through XII are intact, neurovascularly intact in all extremities with 2+ DTRs and 2+ pulses.  Gait normal with good balance and coordination.  MSK:  tender with full range of motion and good stability and symmetric strength and tone of shoulders, elbows, wrist, hip, knee and ankles bilaterally.  Very mild arthritic changes of multiple joints Foot exam on the right side has a positive squeeze test.  Breakdown of the transverse arch still noted.  Patient is tender between the third and fourth and fourth and fifth metatarsal space.   Minimal pain over the lateral fifth metatarsal noted today.  Low back exam shows some tightness noted.  Mild radicular symptoms with straight leg test.  [name] weakness over the lower extremity noted.  Deep tendon reflexes intact.  Tender to palpation in the piriformis area as well.   Impression and Recommendations:     This case required medical decision making of moderate complexity. The above documentation has been reviewed and is accurate and complete Lyndal Pulley, DO         Note: This dictation was prepared with Dragon dictation along with smaller phrase technology. Any transcriptional errors that result from this process are unintentional.

## 2019-08-15 ENCOUNTER — Encounter: Payer: Self-pay | Admitting: Rehabilitative and Restorative Service Providers"

## 2019-08-18 ENCOUNTER — Encounter: Payer: Self-pay | Admitting: Physical Therapy

## 2019-08-18 ENCOUNTER — Ambulatory Visit (INDEPENDENT_AMBULATORY_CARE_PROVIDER_SITE_OTHER): Payer: Medicare HMO | Admitting: Physical Therapy

## 2019-08-18 ENCOUNTER — Other Ambulatory Visit: Payer: Self-pay

## 2019-08-18 DIAGNOSIS — G8929 Other chronic pain: Secondary | ICD-10-CM

## 2019-08-18 DIAGNOSIS — M25571 Pain in right ankle and joints of right foot: Secondary | ICD-10-CM

## 2019-08-18 DIAGNOSIS — M25572 Pain in left ankle and joints of left foot: Secondary | ICD-10-CM

## 2019-08-18 DIAGNOSIS — M545 Low back pain, unspecified: Secondary | ICD-10-CM

## 2019-08-18 NOTE — Patient Instructions (Signed)
Access Code: [email or web address] URL: [email or web address] Date: 08/18/2019 Prepared by: Lyndee Hensen  Exercises Supine Figure 4 Piriformis Stretch - 2 x daily - 2 reps - 30 hold Seated Piriformis Stretch with Trunk Bend - 2 x daily - 3 reps - 30 hold Supine Single Knee to Chest Stretch - 2 x daily - 3 reps - 30 hold

## 2019-08-23 ENCOUNTER — Ambulatory Visit (INDEPENDENT_AMBULATORY_CARE_PROVIDER_SITE_OTHER): Payer: Medicare HMO | Admitting: Physical Therapy

## 2019-08-23 ENCOUNTER — Encounter: Payer: Medicare HMO | Admitting: Physical Therapy

## 2019-08-23 ENCOUNTER — Other Ambulatory Visit: Payer: Self-pay

## 2019-08-23 DIAGNOSIS — M545 Low back pain: Secondary | ICD-10-CM | POA: Diagnosis not present

## 2019-08-23 DIAGNOSIS — G8929 Other chronic pain: Secondary | ICD-10-CM | POA: Diagnosis not present

## 2019-08-23 DIAGNOSIS — M25572 Pain in left ankle and joints of left foot: Secondary | ICD-10-CM | POA: Diagnosis not present

## 2019-08-23 DIAGNOSIS — M25571 Pain in right ankle and joints of right foot: Secondary | ICD-10-CM | POA: Diagnosis not present

## 2019-08-24 ENCOUNTER — Encounter: Payer: Medicare HMO | Admitting: Physical Therapy

## 2019-08-25 ENCOUNTER — Encounter: Payer: Medicare HMO | Admitting: Physical Therapy

## 2019-08-28 ENCOUNTER — Encounter: Payer: Self-pay | Admitting: Physical Therapy

## 2019-08-28 NOTE — Therapy (Signed)
[facility] [address] [place], [place], [address] Phone: [phone]   Fax:  [phone]  Physical Therapy Evaluation  Patient Details  Name: Linda [name] MRN: [id number] Date of Birth: March 26, 1950 Referring Provider (PT): Charlann Boxer   Encounter Date: 08/18/2019  PT End of Session - 08/28/19 1607    Visit Number  1    Number of Visits  12    Date for PT Re-Evaluation  09/29/19    Authorization Type  [employer] / Medicare    PT Start Time  0930    PT Stop Time  1015    PT Time Calculation (min)  45 min    Activity Tolerance  Patient tolerated treatment well    Behavior During Therapy  [facility] for tasks assessed/performed       Past Medical History:  Diagnosis Date  . Cellulitis of leg, right 08/27/2016  . Hyperlipidemia   . Osteopenia   . Vitamin D deficiency     Past Surgical History:  Procedure Laterality Date  . COLONOSCOPY  03/2011   Dr Brodie;diminuitive polyp  . G 2 P 2    . TONSILLECTOMY      There were no vitals filed for this visit.   Subjective Assessment - 08/28/19 1559    Subjective  Pt states increased pain in Bil feet R>L, bottom of foot/lateral. She also has back pain , for 2 years, hurts most , first thing in AM.She states bil knee pain, old meniscus tears. Likes yoga, barre, body pump at [facility].    Limitations  Standing;Lifting;Walking;House hold activities    Patient Stated Goals  Decreased pain in feet and back.    Currently in Pain?  Yes    Pain Score  5     Pain Location  Back    Pain Orientation  Right    Pain Descriptors / Indicators  Aching    Pain Type  Chronic pain    Pain Onset  1 to 4 weeks ago    Pain Frequency  Intermittent    Aggravating Factors   First thing in am, increased bending, activity    Pain Score  4    Pain Location  Foot    Pain Orientation  Right;Left    Pain Descriptors / Indicators  Aching    Pain Type  Acute pain    Pain Onset  1 to 4 weeks ago    Pain Frequency   Intermittent    Aggravating Factors   Standing, walking         [facility] PT Assessment - 08/28/19 0001      Assessment   Medical Diagnosis  Bil foot pain, LBP    Referring Provider (PT)  Charlann Boxer    Prior Therapy  no      Balance Screen   Has the patient fallen in the past 6 months  No      Prior Function   Level of Independence  Independent      Cognition   Overall Cognitive Status  Within Functional Limits for tasks assessed      Posture/Postural Control   Posture Comments  Supine: L LE longer than R,  Standing: L hip higher, mild R toeing out       ROM / Strength   AROM / PROM / Strength  AROM;Strength      AROM   Overall AROM Comments  Lumbar: mod limitation for flex/ext, mild limitation for SB,     AROM  Assessment Site  Ankle    Right/Left Ankle  Right;Left    Right Ankle Dorsiflexion  5    Left Ankle Dorsiflexion  5      Strength   Overall Strength Comments  Ankle: 4/5 gross,  HIps: 4-/5, KNee: 4/5;       Palpation   Palpation comment  R ankle: stiffness and limitation for DF and rearfoot Ev,  Stiffness and limitation for PA mobs, lumbar spine. Soreness in Bil SI,       Special Tests   Other special tests  Neg SLR,  Neg squeeze test at 5th mets.       Ambulation/Gait   Gait Comments  postural asymmetry L hip higher, mild R toeing out.                   Objective measurements completed on examination: See above findings.      [facility] Adult PT Treatment/Exercise - 08/28/19 0001      Exercises   Exercises  Ankle;Lumbar      Lumbar Exercises: Stretches   Single Knee to Chest Stretch  2 reps;30 seconds    Piriformis Stretch  2 reps;30 seconds    Piriformis Stretch Limitations  seated    Figure 4 Stretch  2 reps;30 seconds    Figure 4 Stretch Limitations  supine              PT Education - 08/28/19 1607    Education Details  Initial HEP, PT POC, Exam findings.    Person(s) Educated  Patient    Methods   Explanation;Demonstration;Tactile cues;Verbal cues;Handout    Comprehension  Verbalized understanding;Returned demonstration;Tactile cues required;Verbal cues required;Need further instruction       PT Short Term Goals - 08/28/19 1610      PT SHORT TERM GOAL #1   Title  Pt to be independent with initial HEP    Time  2    Period  Weeks    Status  New    Target Date  09/01/19      PT SHORT TERM GOAL #2   Title  Pt to report decreased pain in bil feet to 2/10    Time  2    Period  Weeks    Status  New    Target Date  09/01/19        PT Long Term Goals - 08/28/19 1611      PT LONG TERM GOAL #1   Title  Pt to be independent with final HEP    Time  6    Period  Weeks    Status  New    Target Date  09/29/19      PT LONG TERM GOAL #2   Title  Pt to report decreased back pain to 0-2/10 with standing , walking , and IADLS.    Time  6    Period  Weeks    Status  New    Target Date  09/29/19      PT LONG TERM GOAL #3   Title  Pt to report ability for  standing and walking, up to 1 mile, with pain 0-1/10 in bil feet  .    Time  6    Period  Weeks    Status  New    Target Date  09/29/19      PT LONG TERM GOAL #4   Title  Pt to be compliant with footwear and/or orthotic as appropriate for her foot type ,  to improve foot pain    Time  6    Period  Weeks    Status  New    Target Date  09/29/19             Plan - 08/28/19 1616    Clinical Impression Statement  Pt presents with primary complaint of increased pain in bil feet and low back. Pt with increased pain at base of bil 5th mets, at lateral plantar surface of feet.  She has decreasedDF ROM bil.  Pt with decreased tolerance for standing and walking due to pain. She also has pain in bil lumbar region.She has mild postural asymmetry with L LE longer than R. She has decreased tolerance and ability for prolonged ambulation due to pain. Pt also with decreased ability for bending, lifting, and IADLS.    Personal Factors  and Comorbidities  Comorbidity 1    Comorbidities  multiple pain locations, back, feet, knees, Osteoporosis,    Examination-Activity Limitations  Bend;Squat;Stand;Lift;Locomotion Level;Caring for Others    Examination-Participation Restrictions  Meal Prep;Cleaning;Personal Finances;Community Activity;Shop;Laundry;Yard Work    Stability/Clinical Decision Making  Stable/Uncomplicated    Clinical Decision Making  Low    Rehab Potential  Good    PT Frequency  2x / week    PT Duration  6 weeks    PT Treatment/Interventions  ADLs/Self Care Home Management;Cryotherapy;Electrical Stimulation;Iontophoresis 4mg /ml Dexamethasone;Moist Heat;Ultrasound;Gait training;Stair training;Functional mobility training;Therapeutic activities;Therapeutic exercise;Balance training;Neuromuscular re-education;Manual techniques;Orthotic Fit/Training;Patient/family education;Passive range of motion;Dry needling;Taping;Vasopneumatic Device;Joint Manipulations;Spinal Manipulations    PT Home Exercise Plan  [email or web address]    Consulted and Agree with Plan of Care  Patient       Patient will benefit from skilled therapeutic intervention in order to improve the following deficits and impairments:  Abnormal gait, Decreased range of motion, Increased muscle spasms, Decreased endurance, Decreased activity tolerance, Pain, Decreased balance, Improper body mechanics, Impaired flexibility, Hypomobility, Decreased mobility, Decreased strength  Visit Diagnosis: Chronic bilateral low back pain without sciatica  Pain in right ankle and joints of right foot  Pain in left ankle and joints of left foot     Problem List Patient Active Problem List   Diagnosis Date Noted  . Acute medial meniscal tear, left, initial encounter 02/16/2019  . Dizziness 10/26/2018  . 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 Upper airway cough  syndrome 11/17/2017  . History of colonic polyps 05/22/2014  . Hyperglycemia 09/04/2013  . Allergic rhinitis 08/06/2013  . ANXIETY STATE, UNSPECIFIED 11/29/2009  . Vitamin D deficiency 04/10/2008  . Osteoporosis 10/04/2007    Lyndee Hensen, PT, DPT 4:35 PM  08/28/19    [facility] [place], [place], [address] Phone: [phone]   Fax:  [phone]  Name: KIRIANA WORTHINGTON MRN: [id number] Date of Birth: 1950/08/16

## 2019-08-30 ENCOUNTER — Other Ambulatory Visit: Payer: Self-pay

## 2019-08-30 ENCOUNTER — Encounter: Payer: Medicare HMO | Admitting: Physical Therapy

## 2019-08-30 ENCOUNTER — Ambulatory Visit: Payer: Medicare HMO | Admitting: Physical Therapy

## 2019-08-30 ENCOUNTER — Encounter: Payer: Self-pay | Admitting: Physical Therapy

## 2019-08-30 DIAGNOSIS — M25572 Pain in left ankle and joints of left foot: Secondary | ICD-10-CM | POA: Diagnosis not present

## 2019-08-30 DIAGNOSIS — G8929 Other chronic pain: Secondary | ICD-10-CM | POA: Diagnosis not present

## 2019-08-30 DIAGNOSIS — M25571 Pain in right ankle and joints of right foot: Secondary | ICD-10-CM | POA: Diagnosis not present

## 2019-08-30 DIAGNOSIS — M545 Low back pain, unspecified: Secondary | ICD-10-CM

## 2019-08-30 NOTE — Therapy (Signed)
[facility] [address] [place], [place], [address] Phone: [phone]   Fax:  [phone]  Physical Therapy Treatment  Patient Details  Name: Linda [name] MRN: [id number] Date of Birth: 1950/04/15 Referring Provider (PT): Charlann Boxer   Encounter Date: 08/23/2019  PT End of Session - 08/30/19 1005    Visit Number  2    Number of Visits  12    Date for PT Re-Evaluation  09/29/19    Authorization Type  [employer] / Medicare    PT Start Time  1302    PT Stop Time  1345    PT Time Calculation (min)  43 min    Activity Tolerance  Patient tolerated treatment well    Behavior During Therapy  [facility] for tasks assessed/performed       Past Medical History:  Diagnosis Date   Cellulitis of leg, right 08/27/2016   Hyperlipidemia    Osteopenia    Vitamin D deficiency     Past Surgical History:  Procedure Laterality Date   COLONOSCOPY  03/2011   Dr Brodie;diminuitive polyp   G 2 P 2     TONSILLECTOMY      There were no vitals filed for this visit.  Subjective Assessment - 08/30/19 1004    Subjective  Pt states soreness in back, and in R foot. Has been doing stretches for HEP.    Limitations  Standing;Lifting;Walking;House hold activities    Patient Stated Goals  Decreased pain in feet and back.    Currently in Pain?  Yes    Pain Score  5     Pain Location  Back    Pain Orientation  Right    Pain Descriptors / Indicators  Aching    Pain Type  Chronic pain    Pain Onset  1 to 4 weeks ago    Pain Frequency  Intermittent    Pain Score  2    Pain Location  Foot    Pain Orientation  Right    Pain Descriptors / Indicators  Aching    Pain Type  Acute pain    Pain Onset  1 to 4 weeks ago    Pain Frequency  Intermittent                        [facility] Adult PT Treatment/Exercise - 08/30/19 0001      Lumbar Exercises: Stretches   Pelvic Tilt  10 reps    Piriformis Stretch  2 reps;30 seconds    Piriformis Stretch  Limitations  seated      Lumbar Exercises: Aerobic   Stationary Bike  L1 x 8 min       Lumbar Exercises: Supine   Ab Set  10 reps    Clam  20 reps    Clam Limitations  GTB    Bent Knee Raise  20 reps    Bridge  10 reps      Lumbar Exercises: Sidelying   Clam  10 reps;Both      Manual Therapy   Manual Therapy  Joint mobilization;Soft tissue mobilization;Manual Traction    Joint Mobilization  PA ankle mobs for DF, rearfoot mobs for Ev on R;     Soft tissue mobilization  DTM R lumbar and glute     Manual Traction  Long leg distraction on R for lumbar pump x 2 min       Ankle Exercises: Stretches   Press photographer  3 reps;30 seconds   at wall              PT Short Term Goals - 08/28/19 1610      PT SHORT TERM GOAL #1   Title  Pt to be independent with initial HEP    Time  2    Period  Weeks    Status  New    Target Date  09/01/19      PT SHORT TERM GOAL #2   Title  Pt to report decreased pain in bil feet to 2/10    Time  2    Period  Weeks    Status  New    Target Date  09/01/19        PT Long Term Goals - 08/28/19 1611      PT LONG TERM GOAL #1   Title  Pt to be independent with final HEP    Time  6    Period  Weeks    Status  New    Target Date  09/29/19      PT LONG TERM GOAL #2   Title  Pt to report decreased back pain to 0-2/10 with standing , walking , and IADLS.    Time  6    Period  Weeks    Status  New    Target Date  09/29/19      PT LONG TERM GOAL #3   Title  Pt to report ability for  standing and walking, up to 1 mile, with pain 0-1/10 in bil feet  .    Time  6    Period  Weeks    Status  New    Target Date  09/29/19      PT LONG TERM GOAL #4   Title  Pt to be compliant with footwear and/or orthotic as appropriate for her foot type , to improve foot pain    Time  6    Period  Weeks    Status  New    Target Date  09/29/19            Plan - 08/30/19 1006    Clinical Impression Statement  Pt with soreness in R SI and R  glute with palpation and DTM. Less pain in feet today. Progressed core strength and education on TA contraction. Pt to benefit from progression of strength as tolerated.    Personal Factors and Comorbidities  Comorbidity 1    Comorbidities  multiple pain locations, back, feet, knees, Osteoporosis,    Examination-Activity Limitations  Bend;Squat;Stand;Lift;Locomotion Level;Caring for Others    Examination-Participation Restrictions  Meal Prep;Cleaning;Personal Finances;Community Activity;Shop;Laundry;Yard Work    Stability/Clinical Decision Making  Stable/Uncomplicated    Rehab Potential  Good    PT Frequency  2x / week    PT Duration  6 weeks    PT Treatment/Interventions  ADLs/Self Care Home Management;Cryotherapy;Electrical Stimulation;Iontophoresis 4mg /ml Dexamethasone;Moist Heat;Ultrasound;Gait training;Stair training;Functional mobility training;Therapeutic activities;Therapeutic exercise;Balance training;Neuromuscular re-education;Manual techniques;Orthotic Fit/Training;Patient/family education;Passive range of motion;Dry needling;Taping;Vasopneumatic Device;Joint Manipulations;Spinal Manipulations    PT Home Exercise Plan  [email or web address]    Consulted and Agree with Plan of Care  Patient       Patient will benefit from skilled therapeutic intervention in order to improve the following deficits and impairments:  Abnormal gait, Decreased range of motion, Increased muscle spasms, Decreased endurance, Decreased activity tolerance, Pain, Decreased balance, Improper body mechanics, Impaired flexibility, Hypomobility, Decreased mobility, Decreased strength  Visit Diagnosis: Chronic bilateral low back pain without sciatica  Pain in right ankle and joints of right foot  Pain in left ankle and joints of left foot     Problem List Patient Active Problem List   Diagnosis Date Noted   Acute medial meniscal tear, left, initial encounter 02/16/2019   Dizziness 10/26/2018   Loss of transverse  plantar arch 10/19/2018   Neuroma of foot 10/19/2018   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

## 2019-08-30 NOTE — Therapy (Signed)
[facility] [address] [place], [place], [address] Phone: [phone]   Fax:  [phone]  Physical Therapy Treatment  Patient Details  Name: Linda [name] MRN: [id number] Date of Birth: [birth year] Referring Provider (PT): Charlann Boxer   Encounter Date: 08/30/2019  PT End of Session - 08/30/19 1013    Visit Number  3    Number of Visits  12    Date for PT Re-Evaluation  09/29/19    Authorization Type  [employer] / Medicare    PT Start Time  0803    PT Stop Time  0856    PT Time Calculation (min)  53 min    Activity Tolerance  Patient tolerated treatment well    Behavior During Therapy  [facility] for tasks assessed/performed       Past Medical History:  Diagnosis Date  . Cellulitis of leg, right 08/27/2016  . Hyperlipidemia   . Osteopenia   . Vitamin D deficiency     Past Surgical History:  Procedure Laterality Date  . COLONOSCOPY  03/2011   Dr Brodie;diminuitive polyp  . G 2 P 2    . TONSILLECTOMY      There were no vitals filed for this visit.  Subjective Assessment - 08/30/19 1012    Subjective  Pt states soreness in R side of back and foot. States activitiy makes back sore, but has been doing HEP.    Currently in Pain?  Yes    Pain Score  4     Pain Location  Back    Pain Orientation  Right    Pain Descriptors / Indicators  Aching    Pain Type  Chronic pain    Pain Onset  More than a month ago    Pain Frequency  Intermittent    Pain Score  2    Pain Location  Foot    Pain Orientation  Right    Pain Descriptors / Indicators  Aching    Pain Type  Acute pain    Pain Onset  1 to 4 weeks ago    Pain Frequency  Intermittent                        [facility] Adult PT Treatment/Exercise - 08/30/19 1017      Lumbar Exercises: Aerobic   Stationary Bike  L1 x 5 min       Lumbar Exercises: Supine   Bridge  20 reps    Straight Leg Raise  15 reps      Lumbar Exercises: Sidelying   Hip Abduction  15 reps;Both       Manual Therapy   Manual Therapy  Joint mobilization;Soft tissue mobilization;Manual Traction    Manual therapy comments  skilled palpation and monitoring of soft tissue with dry needling     Joint Mobilization  PA mobs for Lumbar spine, SI mobs ; Inf and post hip mobs on R;     Soft tissue mobilization  DTM R lumbar and glute     Manual Traction  Long leg distraction on R for lumbar pump x 2 min       Ankle Exercises: Stretches   Gastroc Stretch  3 reps;30 seconds   at wall              PT Short Term Goals - 08/28/19 1610      PT SHORT TERM GOAL #1   Title  Pt to be independent with initial  HEP    Time  2    Period  Weeks    Status  New    Target Date  09/01/19      PT SHORT TERM GOAL #2   Title  Pt to report decreased pain in bil feet to 2/10    Time  2    Period  Weeks    Status  New    Target Date  09/01/19        PT Long Term Goals - 08/28/19 1611      PT LONG TERM GOAL #1   Title  Pt to be independent with final HEP    Time  6    Period  Weeks    Status  New    Target Date  09/29/19      PT LONG TERM GOAL #2   Title  Pt to report decreased back pain to 0-2/10 with standing , walking , and IADLS.    Time  6    Period  Weeks    Status  New    Target Date  09/29/19      PT LONG TERM GOAL #3   Title  Pt to report ability for  standing and walking, up to 1 mile, with pain 0-1/10 in bil feet  .    Time  6    Period  Weeks    Status  New    Target Date  09/29/19      PT LONG TERM GOAL #4   Title  Pt to be compliant with footwear and/or orthotic as appropriate for her foot type , to improve foot pain    Time  6    Period  Weeks    Status  New    Target Date  09/29/19            Plan - 08/30/19 1015    Clinical Impression Statement  Pt with soreness in R SI and R glute. Dry needling done for glute pain, pt with good tolerance. Education and practice for squat mechanics today, for exercise and IADLs, pt with good ablity for proper squat  posture. Continued education and ther ex for Core and hip stabilization. Plan to continue manual for back pain, and continue strengthening as tolerated.    Personal Factors and Comorbidities  Comorbidity 1    Comorbidities  multiple pain locations, back, feet, knees, Osteoporosis,    Examination-Activity Limitations  Bend;Squat;Stand;Lift;Locomotion Level;Caring for Others    Examination-Participation Restrictions  Meal Prep;Cleaning;Personal Finances;Community Activity;Shop;Laundry;Yard Work    Stability/Clinical Decision Making  Stable/Uncomplicated    Rehab Potential  Good    PT Frequency  2x / week    PT Duration  6 weeks    PT Treatment/Interventions  ADLs/Self Care Home Management;Cryotherapy;Electrical Stimulation;Iontophoresis 4mg /ml Dexamethasone;Moist Heat;Ultrasound;Gait training;Stair training;Functional mobility training;Therapeutic activities;Therapeutic exercise;Balance training;Neuromuscular re-education;Manual techniques;Orthotic Fit/Training;Patient/family education;Passive range of motion;Dry needling;Taping;Vasopneumatic Device;Joint Manipulations;Spinal Manipulations    PT Home Exercise Plan  [id number]    Consulted and Agree with Plan of Care  Patient       Patient will benefit from skilled therapeutic intervention in order to improve the following deficits and impairments:  Abnormal gait, Decreased range of motion, Increased muscle spasms, Decreased endurance, Decreased activity tolerance, Pain, Decreased balance, Improper body mechanics, Impaired flexibility, Hypomobility, Decreased mobility, Decreased strength  Visit Diagnosis: Chronic bilateral low back pain without sciatica  Pain in right ankle and joints of right foot  Pain in left ankle and joints of left foot  Problem List Patient Active Problem List   Diagnosis Date Noted  . Acute medial meniscal tear, left, initial encounter 02/16/2019  . Dizziness 10/26/2018  . Loss of transverse plantar arch  10/19/2018  . 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 Upper airway cough syndrome 11/17/2017  . History of colonic polyps 05/22/2014  . Hyperglycemia 09/04/2013  . Allergic rhinitis 08/06/2013  . ANXIETY STATE, UNSPECIFIED 11/29/2009  . Vitamin D deficiency 04/10/2008  . Osteoporosis 10/04/2007    Lyndee Hensen, PT, DPT 10:21 AM  08/30/19    [facility] [place], [place], [address] Phone: [phone]   Fax:  [phone]  Name: KATERI BALCH MRN: [id number] Date of Birth: [birth year]

## 2019-09-01 ENCOUNTER — Ambulatory Visit: Payer: Medicare HMO | Admitting: Physical Therapy

## 2019-09-01 ENCOUNTER — Encounter: Payer: Self-pay | Admitting: Physical Therapy

## 2019-09-01 ENCOUNTER — Encounter: Payer: Medicare HMO | Admitting: Physical Therapy

## 2019-09-01 ENCOUNTER — Other Ambulatory Visit: Payer: Self-pay

## 2019-09-01 DIAGNOSIS — M545 Low back pain: Secondary | ICD-10-CM | POA: Diagnosis not present

## 2019-09-01 DIAGNOSIS — M25571 Pain in right ankle and joints of right foot: Secondary | ICD-10-CM | POA: Diagnosis not present

## 2019-09-01 DIAGNOSIS — M25572 Pain in left ankle and joints of left foot: Secondary | ICD-10-CM

## 2019-09-01 DIAGNOSIS — G8929 Other chronic pain: Secondary | ICD-10-CM | POA: Diagnosis not present

## 2019-09-05 ENCOUNTER — Encounter: Payer: Self-pay | Admitting: Physical Therapy

## 2019-09-05 ENCOUNTER — Encounter: Payer: Medicare HMO | Admitting: Physical Therapy

## 2019-09-05 NOTE — Therapy (Signed)
[facility] [address] [place], [place], [address] Phone: [phone]   Fax:  [phone]  Physical Therapy Treatment  Patient Details  Name: Linda [name] MRN: [id number] Date of Birth: Oct 26, 1950 Referring Provider (PT): Charlann Boxer   Encounter Date: 09/01/2019   PT End of Session - 09/05/19 1552    Visit Number 4    Number of Visits 12    Date for PT Re-Evaluation 09/29/19    Authorization Type [employer] / Medicare    PT Start Time 0802    PT Stop Time 0845    PT Time Calculation (min) 43 min    Activity Tolerance Patient tolerated treatment well    Behavior During Therapy [facility] for tasks assessed/performed           Past Medical History:  Diagnosis Date  . Cellulitis of leg, right 08/27/2016  . Hyperlipidemia   . Osteopenia   . Vitamin D deficiency     Past Surgical History:  Procedure Laterality Date  . COLONOSCOPY  03/2011   Dr Brodie;diminuitive polyp  . G 2 P 2    . TONSILLECTOMY      There were no vitals filed for this visit.   Subjective Assessment - 09/05/19 1546    Subjective Pt states mild improvments of glute/hip pain. Low back is sore this am.    Currently in Pain? Yes    Pain Score 4     Pain Location Back    Pain Orientation Right    Pain Descriptors / Indicators Aching    Pain Type Chronic pain    Pain Onset More than a month ago    Pain Frequency Intermittent    Pain Score 3    Pain Location Foot    Pain Orientation Right    Pain Descriptors / Indicators Aching    Pain Type Acute pain    Pain Onset 1 to 4 weeks ago    Pain Frequency Intermittent                             [facility] Adult PT Treatment/Exercise - 09/05/19 0001      Lumbar Exercises: Aerobic   Stationary Bike L1 x 7 min      Lumbar Exercises: Standing   Other Standing Lumbar Exercises Hip abd 2 x 10 bil;       Lumbar Exercises: Supine   Pelvic Tilt 10 reps    Clam 20 reps    Clam Limitations GTB     Bridge with [employer] 20 reps      Manual Therapy   Joint Mobilization PA mobs for Lumbar spine, SI mobs ; Inf and post hip mobs on R; ;   TCJ  joint mobs to inc EV on R, Fibular mobs,     Soft tissue mobilization STM/roller  R glute and hip     Manual Traction Long leg distraction on R for lumbar pump x 2 min                     PT Short Term Goals - 08/28/19 1610      PT SHORT TERM GOAL #1   Title Pt to be independent with initial HEP    Time 2    Period Weeks    Status New    Target Date 09/01/19      PT SHORT TERM GOAL #2   Title Pt to  report decreased pain in bil feet to 2/10    Time 2    Period Weeks    Status New    Target Date 09/01/19             PT Long Term Goals - 08/28/19 1611      PT LONG TERM GOAL #1   Title Pt to be independent with final HEP    Time 6    Period Weeks    Status New    Target Date 09/29/19      PT LONG TERM GOAL #2   Title Pt to report decreased back pain to 0-2/10 with standing , walking , and IADLS.    Time 6    Period Weeks    Status New    Target Date 09/29/19      PT LONG TERM GOAL #3   Title Pt to report ability for  standing and walking, up to 1 mile, with pain 0-1/10 in bil feet  .    Time 6    Period Weeks    Status New    Target Date 09/29/19      PT LONG TERM GOAL #4   Title Pt to be compliant with footwear and/or orthotic as appropriate for her foot type , to improve foot pain    Time 6    Period Weeks    Status New    Target Date 09/29/19                 Plan - 09/05/19 1553    Clinical Impression Statement Pt with soreness in glute today, but improved after dry needling last visit. Progressed ther ex today for strength and pain. Recommended pt obtain new footwear, for decreased pressure at lateral foot/5th met.    Personal Factors and Comorbidities Comorbidity 1    Comorbidities multiple pain locations, back, feet, knees, Osteoporosis,    Examination-Activity Limitations  Bend;Squat;Stand;Lift;Locomotion Level;Caring for Others    Examination-Participation Restrictions Meal Prep;Cleaning;Personal Finances;Community Activity;Shop;Laundry;Yard Work    Stability/Clinical Decision Making Stable/Uncomplicated    Rehab Potential Good    PT Frequency 2x / week    PT Duration 6 weeks    PT Treatment/Interventions ADLs/Self Care Home Management;Cryotherapy;Electrical Stimulation;Iontophoresis 9m/ml Dexamethasone;Moist Heat;Ultrasound;Gait training;Stair training;Functional mobility training;Therapeutic activities;Therapeutic exercise;Balance training;Neuromuscular re-education;Manual techniques;Orthotic Fit/Training;Patient/family education;Passive range of motion;Dry needling;Taping;Vasopneumatic Device;Joint Manipulations;Spinal Manipulations    PT Home Exercise Plan [id number]    Consulted and Agree with Plan of Care Patient           Patient will benefit from skilled therapeutic intervention in order to improve the following deficits and impairments:  Abnormal gait, Decreased range of motion, Increased muscle spasms, Decreased endurance, Decreased activity tolerance, Pain, Decreased balance, Improper body mechanics, Impaired flexibility, Hypomobility, Decreased mobility, Decreased strength  Visit Diagnosis: Chronic bilateral low back pain without sciatica  Pain in right ankle and joints of right foot  Pain in left ankle and joints of left foot     Problem List Patient Active Problem List   Diagnosis Date Noted  . Acute medial meniscal tear, left, initial encounter 02/16/2019  . Dizziness 10/26/2018  . 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 Upper airway cough syndrome 11/17/2017  . History of colonic polyps 05/22/2014  . Hyperglycemia 09/04/2013  . Allergic rhinitis 08/06/2013  . ANXIETY STATE, UNSPECIFIED 11/29/2009  . Vitamin D  deficiency 04/10/2008  .  Osteoporosis 10/04/2007    Lyndee Hensen, PT, DPT 3:59 PM  09/05/19    [facility] [place], [place], [address] Phone: [phone]   Fax:  [phone]  Name: MIKITA LESMEISTER MRN: [id number] Date of Birth: 1950/08/19

## 2019-09-06 ENCOUNTER — Encounter: Payer: Medicare HMO | Admitting: Physical Therapy

## 2019-09-07 ENCOUNTER — Other Ambulatory Visit: Payer: Self-pay

## 2019-09-07 ENCOUNTER — Encounter: Payer: Self-pay | Admitting: Physical Therapy

## 2019-09-07 ENCOUNTER — Ambulatory Visit: Payer: Medicare HMO | Admitting: Physical Therapy

## 2019-09-07 DIAGNOSIS — G8929 Other chronic pain: Secondary | ICD-10-CM

## 2019-09-07 DIAGNOSIS — M25571 Pain in right ankle and joints of right foot: Secondary | ICD-10-CM | POA: Diagnosis not present

## 2019-09-07 DIAGNOSIS — M545 Low back pain, unspecified: Secondary | ICD-10-CM

## 2019-09-07 DIAGNOSIS — M25572 Pain in left ankle and joints of left foot: Secondary | ICD-10-CM | POA: Diagnosis not present

## 2019-09-07 NOTE — Therapy (Signed)
[facility] [address] [place], [place], [address] Phone: [phone]   Fax:  [phone]  Physical Therapy Treatment  Patient Details  Name: Linda [name] MRN: [id number] Date of Birth: June 22, 1950 Referring Provider (PT): Charlann Boxer   Encounter Date: 09/07/2019   PT End of Session - 09/07/19 1005    Visit Number 5    Number of Visits 12    Date for PT Re-Evaluation 09/29/19    Authorization Type [employer] / Medicare    PT Start Time 0804    PT Stop Time 0847    PT Time Calculation (min) 43 min    Activity Tolerance Patient tolerated treatment well    Behavior During Therapy [facility] for tasks assessed/performed           Past Medical History:  Diagnosis Date  . Cellulitis of leg, right 08/27/2016  . Hyperlipidemia   . Osteopenia   . Vitamin D deficiency     Past Surgical History:  Procedure Laterality Date  . COLONOSCOPY  03/2011   Dr Brodie;diminuitive polyp  . G 2 P 2    . TONSILLECTOMY      There were no vitals filed for this visit.   Subjective Assessment - 09/07/19 1001    Subjective Pt states mild stiffness in back, mostly in AMs, foot sore with increased standing/walking    Currently in Pain? Yes    Pain Score 3     Pain Location Back    Pain Orientation Right    Pain Descriptors / Indicators Aching    Pain Type Chronic pain    Pain Onset More than a month ago    Pain Frequency Intermittent    Pain Score 3    Pain Location Foot    Pain Orientation Right    Pain Descriptors / Indicators Aching    Pain Type Acute pain    Pain Onset 1 to 4 weeks ago    Pain Frequency Intermittent                             [facility] Adult PT Treatment/Exercise - 09/07/19 0809      Lumbar Exercises: Stretches   Lower Trunk Rotation 5 reps;10 seconds    Pelvic Tilt 15 reps    Other Lumbar Stretch Exercise Standing QL stretch 30 sec x 2 bil;       Lumbar Exercises: Aerobic   Stationary Bike L1 x 7 min       Lumbar Exercises: Standing   Other Standing Lumbar Exercises Hip abd and ext 2 x 10 bil;       Lumbar Exercises: Supine   Pelvic Tilt 10 reps    Clam 20 reps    Clam Limitations GTB    Bridge with Ball Squeeze 20 reps      Lumbar Exercises: Sidelying   Hip Abduction 15 reps;Both      Manual Therapy   Manual Therapy Passive ROM    Joint Mobilization   TCJ  joint mobs to inc EV on R, A/P STJ mobs for DF,  Fibular mobs,     Soft tissue mobilization --    Manual Traction Long leg distraction on R for lumbar pump x 2 min                     PT Short Term Goals - 08/28/19 1610      PT SHORT TERM GOAL #  1   Title Pt to be independent with initial HEP    Time 2    Period Weeks    Status New    Target Date 09/01/19      PT SHORT TERM GOAL #2   Title Pt to report decreased pain in bil feet to 2/10    Time 2    Period Weeks    Status New    Target Date 09/01/19             PT Long Term Goals - 08/28/19 1611      PT LONG TERM GOAL #1   Title Pt to be independent with final HEP    Time 6    Period Weeks    Status New    Target Date 09/29/19      PT LONG TERM GOAL #2   Title Pt to report decreased back pain to 0-2/10 with standing , walking , and IADLS.    Time 6    Period Weeks    Status New    Target Date 09/29/19      PT LONG TERM GOAL #3   Title Pt to report ability for  standing and walking, up to 1 mile, with pain 0-1/10 in bil feet  .    Time 6    Period Weeks    Status New    Target Date 09/29/19      PT LONG TERM GOAL #4   Title Pt to be compliant with footwear and/or orthotic as appropriate for her foot type , to improve foot pain    Time 6    Period Weeks    Status New    Target Date 09/29/19                 Plan - 09/07/19 1006    Clinical Impression Statement Discussed doing a few lumbar stretches first thing in am, for improved stiffness. Continued ankle mobilizations for improving rearfoot valgus, expect pain in lateral  foot is due to increased pressure. Pt may also benefit from heel lift for improved leg length deficit, and decreased pressure on R foot. Plan to continue manual for glute/lumbar sorness at tomorrows visit    Personal Factors and Comorbidities Comorbidity 1    Comorbidities multiple pain locations, back, feet, knees, Osteoporosis,    Examination-Activity Limitations Bend;Squat;Stand;Lift;Locomotion Level;Caring for Others    Examination-Participation Restrictions Meal Prep;Cleaning;Personal Finances;Community Activity;Shop;Laundry;Yard Work    Stability/Clinical Decision Making Stable/Uncomplicated    Rehab Potential Good    PT Frequency 2x / week    PT Duration 6 weeks    PT Treatment/Interventions ADLs/Self Care Home Management;Cryotherapy;Electrical Stimulation;Iontophoresis 4mg /ml Dexamethasone;Moist Heat;Ultrasound;Gait training;Stair training;Functional mobility training;Therapeutic activities;Therapeutic exercise;Balance training;Neuromuscular re-education;Manual techniques;Orthotic Fit/Training;Patient/family education;Passive range of motion;Dry needling;Taping;Vasopneumatic Device;Joint Manipulations;Spinal Manipulations    PT Home Exercise Plan [id number]    Consulted and Agree with Plan of Care Patient           Patient will benefit from skilled therapeutic intervention in order to improve the following deficits and impairments:  Abnormal gait, Decreased range of motion, Increased muscle spasms, Decreased endurance, Decreased activity tolerance, Pain, Decreased balance, Improper body mechanics, Impaired flexibility, Hypomobility, Decreased mobility, Decreased strength  Visit Diagnosis: Chronic bilateral low back pain without sciatica  Pain in right ankle and joints of right foot  Pain in left ankle and joints of left foot     Problem List Patient Active Problem List   Diagnosis Date Noted  . Acute medial meniscal tear,  left, initial encounter 02/16/2019  . Dizziness  10/26/2018  . 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 Upper airway cough syndrome 11/17/2017  . History of colonic polyps 05/22/2014  . Hyperglycemia 09/04/2013  . Allergic rhinitis 08/06/2013  . ANXIETY STATE, UNSPECIFIED 11/29/2009  . Vitamin D deficiency 04/10/2008  . Osteoporosis 10/04/2007    Lyndee Hensen, PT, DPT 10:08 AM  09/07/19    [facility] [place], [place], [address] Phone: [phone]   Fax:  [phone]  Name: ETOLA MULL MRN: [id number] Date of Birth: 07-13-1950

## 2019-09-08 ENCOUNTER — Encounter: Payer: Self-pay | Admitting: Physical Therapy

## 2019-09-08 ENCOUNTER — Ambulatory Visit: Payer: Medicare HMO | Admitting: Physical Therapy

## 2019-09-08 DIAGNOSIS — M25571 Pain in right ankle and joints of right foot: Secondary | ICD-10-CM

## 2019-09-08 DIAGNOSIS — G8929 Other chronic pain: Secondary | ICD-10-CM | POA: Diagnosis not present

## 2019-09-08 DIAGNOSIS — M545 Low back pain: Secondary | ICD-10-CM

## 2019-09-08 DIAGNOSIS — M25572 Pain in left ankle and joints of left foot: Secondary | ICD-10-CM

## 2019-09-10 ENCOUNTER — Encounter: Payer: Self-pay | Admitting: Physical Therapy

## 2019-09-10 NOTE — Therapy (Addendum)
[facility] [address] [place], [place], [address] Phone: [phone]   Fax:  [phone]  Physical Therapy Treatment  Patient Details  Name: Linda [name] MRN: [id number] Date of Birth: 08/02/1950 Referring Provider (PT): Charlann Boxer   Encounter Date: 09/08/2019   PT End of Session - 09/10/19 1556    Visit Number 6    Number of Visits 12    Date for PT Re-Evaluation 09/29/19    Authorization Type [employer] / Medicare    PT Start Time 1430    PT Stop Time 1513    PT Time Calculation (min) 43 min    Activity Tolerance Patient tolerated treatment well    Behavior During Therapy [facility] for tasks assessed/performed           Past Medical History:  Diagnosis Date  . Cellulitis of leg, right 08/27/2016  . Hyperlipidemia   . Osteopenia   . Vitamin D deficiency     Past Surgical History:  Procedure Laterality Date  . COLONOSCOPY  03/2011   Dr Brodie;diminuitive polyp  . G 2 P 2    . TONSILLECTOMY      There were no vitals filed for this visit.   Subjective Assessment - 09/10/19 1556    Subjective Pt states soreness in back daily, but is better. Still is sore in AM, and with certain movements. She is stressed with her mother being sick, and in rehab facility. Requests no needling today due to stress.    Currently in Pain? Yes    Pain Score 3     Pain Location Back    Pain Orientation Right    Pain Descriptors / Indicators Aching    Pain Type Chronic pain    Pain Onset More than a month ago    Pain Frequency Intermittent    Pain Score 3    Pain Location Foot    Pain Orientation Right    Pain Descriptors / Indicators Aching    Pain Type Acute pain    Pain Onset 1 to 4 weeks ago    Pain Frequency Intermittent                             [facility] Adult PT Treatment/Exercise - 09/10/19 0001      Lumbar Exercises: Stretches   Single Knee to Chest Stretch 2 reps;30 seconds    Pelvic Tilt 15 reps     Piriformis Stretch 2 reps;30 seconds    Piriformis Stretch Limitations seated      Lumbar Exercises: Aerobic   Stationary Bike L2 x 8 min      Lumbar Exercises: Standing   Row 20 reps    Theraband Level (Row) Level 3 (Green)    Other Standing Lumbar Exercises Hip abd and ext 2 x 10 bil;       Lumbar Exercises: Supine   Clam 20 reps    Clam Limitations GTB    Bridge with [employer] 20 reps      Manual Therapy   Manual Therapy Passive ROM    Joint Mobilization   PA lumbar and SI mobs;  TCJ  joint mobs to inc EV on R, A/P STJ mobs for DF,     Soft tissue mobilization STM to bil lumbar , SI and glute  , Roller to bil glute/hip     Manual Traction Long leg distraction on R for lumbar pump x 2 min  PT Short Term Goals - 08/28/19 1610      PT SHORT TERM GOAL #1   Title Pt to be independent with initial HEP    Time 2    Period Weeks    Status New    Target Date 09/01/19      PT SHORT TERM GOAL #2   Title Pt to report decreased pain in bil feet to 2/10    Time 2    Period Weeks    Status New    Target Date 09/01/19             PT Long Term Goals - 08/28/19 1611      PT LONG TERM GOAL #1   Title Pt to be independent with final HEP    Time 6    Period Weeks    Status New    Target Date 09/29/19      PT LONG TERM GOAL #2   Title Pt to report decreased back pain to 0-2/10 with standing , walking , and IADLS.    Time 6    Period Weeks    Status New    Target Date 09/29/19      PT LONG TERM GOAL #3   Title Pt to report ability for  standing and walking, up to 1 mile, with pain 0-1/10 in bil feet  .    Time 6    Period Weeks    Status New    Target Date 09/29/19      PT LONG TERM GOAL #4   Title Pt to be compliant with footwear and/or orthotic as appropriate for her foot type , to improve foot pain    Time 6    Period Weeks    Status New    Target Date 09/29/19                 Plan - 09/10/19 1558    Clinical  Impression Statement Discussed modifications for yoga class, for up dog and for rotation, pt states soreness during class. Pt with tenderness in bil SI and glutes today, addressed with manual Pt with good body mechanics in clinic with bending/lifting. Plan to progress as tolerated.    Personal Factors and Comorbidities Comorbidity 1    Comorbidities multiple pain locations, back, feet, knees, Osteoporosis,    Examination-Activity Limitations Bend;Squat;Stand;Lift;Locomotion Level;Caring for Others    Examination-Participation Restrictions Meal Prep;Cleaning;Personal Finances;Community Activity;Shop;Laundry;Yard Work    Stability/Clinical Decision Making Stable/Uncomplicated    Rehab Potential Good    PT Frequency 2x / week    PT Duration 6 weeks    PT Treatment/Interventions ADLs/Self Care Home Management;Cryotherapy;Electrical Stimulation;Iontophoresis 58m/ml Dexamethasone;Moist Heat;Ultrasound;Gait training;Stair training;Functional mobility training;Therapeutic activities;Therapeutic exercise;Balance training;Neuromuscular re-education;Manual techniques;Orthotic Fit/Training;Patient/family education;Passive range of motion;Dry needling;Taping;Vasopneumatic Device;Joint Manipulations;Spinal Manipulations    PT Home Exercise Plan [id number]    Consulted and Agree with Plan of Care Patient           Patient will benefit from skilled therapeutic intervention in order to improve the following deficits and impairments:  Abnormal gait, Decreased range of motion, Increased muscle spasms, Decreased endurance, Decreased activity tolerance, Pain, Decreased balance, Improper body mechanics, Impaired flexibility, Hypomobility, Decreased mobility, Decreased strength  Visit Diagnosis: Chronic bilateral low back pain without sciatica  Pain in right ankle and joints of right foot  Pain in left ankle and joints of left foot     Problem List Patient Active Problem List   Diagnosis Date Noted  . Acute  medial meniscal  tear, left, initial encounter 02/16/2019  . Dizziness 10/26/2018  . 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 Upper airway cough syndrome 11/17/2017  . History of colonic polyps 05/22/2014  . Hyperglycemia 09/04/2013  . Allergic rhinitis 08/06/2013  . ANXIETY STATE, UNSPECIFIED 11/29/2009  . Vitamin D deficiency 04/10/2008  . Osteoporosis 10/04/2007    Lyndee Hensen, PT, DPT 4:00 PM  09/10/19    [facility] [place], [place], [address] Phone: [phone]   Fax:  [phone]  Name: Linda [name] MRN: [id number] Date of Birth: Jul 15, 1950      PHYSICAL THERAPY DISCHARGE SUMMARY  Visits from Start of Care: 6 Plan: Patient agrees to discharge.  Patient goals were partially met. Patient is being discharged due to not returning since the last visit.  ?????     Lyndee Hensen, PT, DPT 3:01 PM  04/11/20

## 2019-09-13 ENCOUNTER — Encounter: Payer: Medicare HMO | Admitting: Physical Therapy

## 2019-09-15 ENCOUNTER — Encounter: Payer: Medicare HMO | Admitting: Physical Therapy

## 2019-10-10 DIAGNOSIS — H2513 Age-related nuclear cataract, bilateral: Secondary | ICD-10-CM | POA: Diagnosis not present

## 2019-10-10 DIAGNOSIS — H0102A Squamous blepharitis right eye, upper and lower eyelids: Secondary | ICD-10-CM | POA: Diagnosis not present

## 2019-10-10 DIAGNOSIS — S0502XA Injury of conjunctiva and corneal abrasion without foreign body, left eye, initial encounter: Secondary | ICD-10-CM | POA: Diagnosis not present

## 2019-10-10 DIAGNOSIS — H35362 Drusen (degenerative) of macula, left eye: Secondary | ICD-10-CM | POA: Diagnosis not present

## 2019-10-10 DIAGNOSIS — D3132 Benign neoplasm of left choroid: Secondary | ICD-10-CM | POA: Diagnosis not present

## 2019-10-10 DIAGNOSIS — H04123 Dry eye syndrome of bilateral lacrimal glands: Secondary | ICD-10-CM | POA: Diagnosis not present

## 2019-10-10 DIAGNOSIS — H0102B Squamous blepharitis left eye, upper and lower eyelids: Secondary | ICD-10-CM | POA: Diagnosis not present

## 2019-10-17 DIAGNOSIS — H353132 Nonexudative age-related macular degeneration, bilateral, intermediate dry stage: Secondary | ICD-10-CM | POA: Diagnosis not present

## 2019-10-17 DIAGNOSIS — D3132 Benign neoplasm of left choroid: Secondary | ICD-10-CM | POA: Diagnosis not present

## 2019-10-17 DIAGNOSIS — H35363 Drusen (degenerative) of macula, bilateral: Secondary | ICD-10-CM | POA: Diagnosis not present

## 2019-10-17 DIAGNOSIS — S0502XD Injury of conjunctiva and corneal abrasion without foreign body, left eye, subsequent encounter: Secondary | ICD-10-CM | POA: Diagnosis not present

## 2019-10-17 DIAGNOSIS — H04123 Dry eye syndrome of bilateral lacrimal glands: Secondary | ICD-10-CM | POA: Diagnosis not present

## 2019-10-17 DIAGNOSIS — H2513 Age-related nuclear cataract, bilateral: Secondary | ICD-10-CM | POA: Diagnosis not present

## 2019-10-17 DIAGNOSIS — H0102B Squamous blepharitis left eye, upper and lower eyelids: Secondary | ICD-10-CM | POA: Diagnosis not present

## 2019-10-17 DIAGNOSIS — H0102A Squamous blepharitis right eye, upper and lower eyelids: Secondary | ICD-10-CM | POA: Diagnosis not present

## 2019-11-02 ENCOUNTER — Encounter: Payer: Self-pay | Admitting: Internal Medicine

## 2019-11-02 ENCOUNTER — Other Ambulatory Visit: Payer: Self-pay

## 2019-11-02 ENCOUNTER — Ambulatory Visit (INDEPENDENT_AMBULATORY_CARE_PROVIDER_SITE_OTHER): Payer: Medicare HMO | Admitting: Internal Medicine

## 2019-11-02 ENCOUNTER — Ambulatory Visit: Payer: Medicare HMO | Admitting: Internal Medicine

## 2019-11-02 VITALS — BP 136/86 | HR 77 | Temp 98.9°F | Resp 16 | Ht 64.0 in | Wt 138.2 lb

## 2019-11-02 DIAGNOSIS — M797 Fibromyalgia: Secondary | ICD-10-CM | POA: Diagnosis not present

## 2019-11-02 DIAGNOSIS — R739 Hyperglycemia, unspecified: Secondary | ICD-10-CM | POA: Diagnosis not present

## 2019-11-02 DIAGNOSIS — M791 Myalgia, unspecified site: Secondary | ICD-10-CM | POA: Diagnosis not present

## 2019-11-02 NOTE — Progress Notes (Signed)
Subjective:  Patient ID: Linda [name], female    DOB: 1950/07/14  Age: 69 y.o. MRN: [id number]  CC: Muscle Pain  This visit occurred during the SARS-CoV-2 public health emergency.  Safety protocols were in place, including screening questions prior to the visit, additional usage of staff PPE, and extensive cleaning of exam room while observing appropriate contact time as indicated for disinfecting solutions.   NEW TO ME  HPI AVON MOLOCK presents for concerns about diffuse myalgias.  For the last month she has had diffuse muscle aches.  She recently saw a neurosurgeon for back pain and was prescribed prednisone which helped the symptoms but now they have returned.  She takes ibuprofen and says the pain gets better.  She denies muscle weakness, fatigue, depression, anxiety, or insomnia.  She has never had this before but it looks like she does have a prior diagnosis of myalgias.  She is very active with yoga and an exercise regimen.  The myalgias do not interfere with her activities.  Outpatient Medications Prior to Visit  Medication Sig Dispense Refill  . Ascorbic Acid (VITAMIN C) 1000 MG tablet Take 1,000 mg by mouth daily.      . Calcium Carb-Cholecalciferol (CALCIUM 1000 + D PO) Take by mouth daily.      . cholecalciferol (VITAMIN D) 1000 units tablet Take 1.5 tablets (1,500 Units total) by mouth daily. 90 tablet 2  . tretinoin (RETIN-A) 0.05 % cream APPLY A PEASIZE AMOUNT ONTO THE FACE NIGHTLY    . predniSONE (DELTASONE) 50 MG tablet Take one tablet daily for the next 5 days. 5 tablet 0   No facility-administered medications prior to visit.    ROS Review of Systems  Constitutional: Negative.  Negative for appetite change, diaphoresis, fatigue and unexpected weight change.  HENT: Negative.   Eyes: Negative for visual disturbance.  Respiratory: Negative for cough, chest tightness, shortness of breath and wheezing.   Cardiovascular: Negative for chest pain, palpitations and leg  swelling.  Gastrointestinal: Negative for abdominal pain, constipation, diarrhea, nausea and vomiting.  Endocrine: Negative.  Negative for cold intolerance and heat intolerance.  Genitourinary: Negative.  Negative for difficulty urinating.  Musculoskeletal: Positive for back pain and myalgias. Negative for arthralgias, joint swelling, neck pain and neck stiffness.  Skin: Negative.  Negative for color change, pallor and rash.  Neurological: Negative.  Negative for dizziness, weakness, light-headedness, numbness and headaches.  Hematological: Negative for adenopathy. Does not bruise/bleed easily.  Psychiatric/Behavioral: Negative.     Objective:  BP 136/86 (BP Location: Left Arm, Patient Position: Sitting, Cuff Size: Normal)   Pulse 77   Temp 98.9 F (37.2 C) (Oral)   Resp 16   Ht 5\' 4"  (1.626 m)   Wt 138 lb 4 oz (62.7 kg)   SpO2 97%   BMI 23.73 kg/m   BP Readings from Last 3 Encounters:  11/02/19 136/86  08/05/19 118/74  08/03/19 (!) 146/84    Wt Readings from Last 3 Encounters:  11/02/19 138 lb 4 oz (62.7 kg)  08/05/19 145 lb (65.8 kg)  08/03/19 144 lb (65.3 kg)    Physical Exam Vitals reviewed.  Constitutional:      Appearance: Normal appearance.  HENT:     Nose: Nose normal.     Mouth/Throat:     Mouth: Mucous membranes are moist.  Eyes:     General: No scleral icterus.    Conjunctiva/sclera: Conjunctivae normal.  Cardiovascular:     Rate and Rhythm: Normal rate and regular  rhythm.     Heart sounds: No murmur heard.   Pulmonary:     Effort: Pulmonary effort is normal.     Breath sounds: No stridor. No wheezing, rhonchi or rales.  Abdominal:     General: Abdomen is flat.     Palpations: There is no mass.     Tenderness: There is no abdominal tenderness. There is no guarding.  Musculoskeletal:        General: No swelling or tenderness. Normal range of motion.     Cervical back: Neck supple.     Right lower leg: No edema.     Left lower leg: No edema.    Lymphadenopathy:     Cervical: No cervical adenopathy.  Skin:    General: Skin is warm and dry.     Coloration: Skin is not pale.  Neurological:     General: No focal deficit present.     Mental Status: She is alert and oriented to person, place, and time. Mental status is at baseline.     Cranial Nerves: Cranial nerves are intact.     Sensory: Sensation is intact.     Motor: Motor function is intact. No weakness, tremor or atrophy.     Coordination: Coordination is intact.     Deep Tendon Reflexes:     Reflex Scores:      Tricep reflexes are 0 on the right side and 0 on the left side.      Bicep reflexes are 0 on the right side and 0 on the left side.      Brachioradialis reflexes are 0 on the right side and 0 on the left side.      Patellar reflexes are 0 on the right side and 0 on the left side.      Achilles reflexes are 0 on the right side and 0 on the left side.    Comments: No PMR  Psychiatric:        Mood and Affect: Mood normal.        Behavior: Behavior normal.     Lab Results  Component Value Date   WBC 6.7 11/02/2019   HGB 12.9 11/02/2019   HCT 38.1 11/02/2019   PLT 249 11/02/2019   GLUCOSE 107 (H) 11/02/2019   CHOL 224 (H) 08/03/2019   TRIG 79.0 08/03/2019   HDL 84.40 08/03/2019   LDLDIRECT 76.4 04/07/2011   LDLCALC 124 (H) 08/03/2019   ALT 11 08/03/2019   AST 17 08/03/2019   NA 138 11/02/2019   K 3.9 11/02/2019   CL 99 11/02/2019   CREATININE 0.53 11/02/2019   BUN 11 11/02/2019   CO2 30 11/02/2019   TSH 1.98 11/02/2019   HGBA1C 5.5 11/02/2019    DG Lumbar Spine 2-3 Views  Result Date: 08/05/2019 CLINICAL DATA:  Low back pain EXAM: LUMBAR SPINE - 2-3 VIEW COMPARISON:  None. FINDINGS: Lumbar alignment within normal limits. Vertebral body heights are maintained. Mild degenerative changes at L2-L3 and L5-S1. IMPRESSION: Mild degenerative changes.  No acute osseous abnormality. Electronically Signed   By: Donavan Foil M.D.   On: 08/05/2019 22:22   [place]  LIMITED JOINT SPACE STRUCTURES LOW RIGHT(NO LINKED CHARGES)  Result Date: 08/11/2019 No images saved    Assessment & Plan:   Jenavieve was seen today for muscle pain.  Diagnoses and all orders for this visit:  Myalgia- Her CRP and sed rate are mildly elevated but not significantly elevated for her age.  Her CK is normal, she  is not anemic, and her TSH is normal.  I am concerned she has fibromyalgia.  Since she tells me that ibuprofen controls the pain I have asked her to take that as needed.  I have also recommended that she start taking duloxetine and to increase the dose over the next few months.  Additionally she will see rheumatology for a second opinion. -     CBC with Differential/Platelet; Future -     CK; Future -     Sedimentation rate; Future -     C-reactive protein; Future -     TSH; Future -     TSH -     C-reactive protein -     Sedimentation rate -     CK -     CBC with Differential/Platelet -     Ambulatory referral to Rheumatology  Hyperglycemia -     Hemoglobin A1c; Future -     BASIC METABOLIC PANEL WITH GFR; Future -     BASIC METABOLIC PANEL WITH GFR -     Hemoglobin A1c  Fibromyalgia -     DULoxetine (CYMBALTA) 30 MG capsule; Take 1 capsule (30 mg total) by mouth daily.   I have discontinued Jaynie Collins. Shambaugh's predniSONE. I am also having her start on DULoxetine. Additionally, I am having her maintain her Calcium Carb-Cholecalciferol (CALCIUM 1000 + D PO), vitamin C, cholecalciferol, and tretinoin.  Meds ordered this encounter  Medications  . DULoxetine (CYMBALTA) 30 MG capsule    Sig: Take 1 capsule (30 mg total) by mouth daily.    Dispense:  30 capsule    Refill:  0     Follow-up: Return in about 3 weeks (around 11/23/2019).  Scarlette Calico, MD

## 2019-11-02 NOTE — Patient Instructions (Signed)
Myofascial Pain Syndrome and Fibromyalgia Myofascial pain syndrome and fibromyalgia are both pain disorders. This pain may be felt mainly in your muscles.  Myofascial pain syndrome: ? Always has tender points in the muscle that will cause pain when pressed (trigger points). The pain may come and go. ? Usually affects your neck, upper back, and shoulder areas. The pain often radiates into your arms and hands.  Fibromyalgia: ? Has muscle pains and tenderness that come and go. ? Is often associated with fatigue and sleep problems. ? Has trigger points. ? Tends to be long-lasting (chronic), but is not life-threatening. Fibromyalgia and myofascial pain syndrome are not the same. However, they often occur together. If you have both conditions, each can make the other worse. Both are common and can cause enough pain and fatigue to make day-to-day activities difficult. Both can be hard to diagnose because their symptoms are common in many other conditions. What are the causes? The exact causes of these conditions are not known. What increases the risk? You are more likely to develop this condition if:  You have a family history of the condition.  You have certain triggers, such as: ? Spine disorders. ? An injury (trauma) or other physical stressors. ? Being under a lot of stress. ? Medical conditions such as osteoarthritis, rheumatoid arthritis, or lupus. What are the signs or symptoms? Fibromyalgia The main symptom of fibromyalgia is widespread pain and tenderness in your muscles. Pain is sometimes described as stabbing, shooting, or burning. You may also have:  Tingling or numbness.  Sleep problems and fatigue.  Problems with attention and concentration (fibro fog). Other symptoms may include:  Bowel and bladder problems.  Headaches.  Visual problems.  Problems with odors and noises.  Depression or mood changes.  Painful menstrual periods (dysmenorrhea).  Dry skin or  eyes. These symptoms can vary over time. Myofascial pain syndrome Symptoms of myofascial pain syndrome include:  Tight, ropy bands of muscle.  Uncomfortable sensations in muscle areas. These may include aching, cramping, burning, numbness, tingling, and weakness.  Difficulty moving certain parts of the body freely (poor range of motion). How is this diagnosed? This condition may be diagnosed by your symptoms and medical history. You will also have a physical exam. In general:  Fibromyalgia is diagnosed if you have pain, fatigue, and other symptoms for more than 3 months, and symptoms cannot be explained by another condition.  Myofascial pain syndrome is diagnosed if you have trigger points in your muscles, and those trigger points are tender and cause pain elsewhere in your body (referred pain). How is this treated? Treatment for these conditions depends on the type that you have.  For fibromyalgia: ? Pain medicines, such as NSAIDs. ? Medicines for treating depression. ? Medicines for treating seizures. ? Medicines that relax the muscles.  For myofascial pain: ? Pain medicines, such as NSAIDs. ? Cooling and stretching of muscles. ? Trigger point injections. ? Sound wave (ultrasound) treatments to stimulate muscles. Treating these conditions often requires a team of health care providers. These may include:  Your primary care provider.  Physical therapist.  Complementary health care providers, such as massage therapists or acupuncturists.  Psychiatrist for cognitive behavioral therapy. Follow these instructions at home: Medicines  Take over-the-counter and prescription medicines only as told by your health care provider.  Do not drive or use heavy machinery while taking prescription pain medicine.  If you are taking prescription pain medicine, take actions to prevent or treat constipation. Your health care   provider may recommend that you: ? Drink enough fluid to keep  your urine pale yellow. ? Eat foods that are high in fiber, such as fresh fruits and vegetables, whole grains, and beans. ? Limit foods that are high in fat and processed sugars, such as fried or sweet foods. ? Take an over-the-counter or prescription medicine for constipation. Lifestyle   Exercise as directed by your health care provider or physical therapist.  Practice relaxation techniques to control your stress. You may want to try: ? Biofeedback. ? Visual imagery. ? Hypnosis. ? Muscle relaxation. ? Yoga. ? Meditation.  Maintain a healthy lifestyle. This includes eating a healthy diet and getting enough sleep.  Do not use any products that contain nicotine or tobacco, such as cigarettes and e-cigarettes. If you need help quitting, ask your health care provider. General instructions  Talk to your health care provider about complementary treatments, such as acupuncture or massage.  Consider joining a support group with others who are diagnosed with this condition.  Do not do activities that stress or strain your muscles. This includes repetitive motions and heavy lifting.  Keep all follow-up visits as told by your health care provider. This is important. Where to find more information  National Fibromyalgia Association: www.fmaware.org  Arthritis Foundation: www.arthritis.org  American Chronic Pain Association: www.theacpa.org Contact a health care provider if:  You have new symptoms.  Your symptoms get worse or your pain is severe.  You have side effects from your medicines.  You have trouble sleeping.  Your condition is causing depression or anxiety. Summary  Myofascial pain syndrome and fibromyalgia are pain disorders.  Myofascial pain syndrome has tender points in the muscle that will cause pain when pressed (trigger points). Fibromyalgia also has muscle pains and tenderness that come and go, but this condition is often associated with fatigue and sleep  disturbances.  Fibromyalgia and myofascial pain syndrome are not the same but often occur together, causing pain and fatigue that make day-to-day activities difficult.  Treatment for fibromyalgia includes taking medicines to relax the muscles and medicines for pain, depression, or seizures. Treatment for myofascial pain syndrome includes taking medicines for pain, cooling and stretching of muscles, and injecting medicines into trigger points.  Follow your health care provider's instructions for taking medicines and maintaining a healthy lifestyle. This information is not intended to replace advice given to you by your health care provider. Make sure you discuss any questions you have with your health care provider. Document Revised: 07/02/2018 Document Reviewed: 03/25/2017 Elsevier Patient Education  2020 [employer]  

## 2019-11-03 ENCOUNTER — Encounter: Payer: Self-pay | Admitting: Internal Medicine

## 2019-11-03 LAB — CBC WITH DIFFERENTIAL/PLATELET
Absolute Monocytes: 422 cells/uL (ref 200–950)
Basophils Absolute: 20 cells/uL (ref 0–200)
Basophils Relative: 0.3 %
Eosinophils Absolute: 54 cells/uL (ref 15–500)
Eosinophils Relative: 0.8 %
HCT: 38.1 % (ref 35.0–45.0)
Hemoglobin: 12.9 g/dL (ref 11.7–15.5)
Lymphs Abs: 1581 cells/uL (ref 850–3900)
MCH: 29.7 pg (ref 27.0–33.0)
MCHC: 33.9 g/dL (ref 32.0–36.0)
MCV: 87.8 fL (ref 80.0–100.0)
MPV: 9.7 fL (ref 7.5–12.5)
Monocytes Relative: 6.3 %
Neutro Abs: 4623 cells/uL (ref 1500–7800)
Neutrophils Relative %: 69 %
Platelets: 249 10*3/uL (ref 140–400)
RBC: 4.34 10*6/uL (ref 3.80–5.10)
RDW: 13 % (ref 11.0–15.0)
Total Lymphocyte: 23.6 %
WBC: 6.7 10*3/uL (ref 3.8–10.8)

## 2019-11-03 LAB — HEMOGLOBIN A1C
Hgb A1c MFr Bld: 5.5 % of total Hgb (ref <5.7)
Mean Plasma Glucose: 111 (calc)
eAG (mmol/L): 6.2 (calc)

## 2019-11-03 LAB — BASIC METABOLIC PANEL WITH GFR
BUN: 11 mg/dL (ref 7–25)
CO2: 30 mmol/L (ref 20–32)
Calcium: 10 mg/dL (ref 8.6–10.4)
Chloride: 99 mmol/L (ref 98–110)
Creat: 0.53 mg/dL (ref 0.50–0.99)
GFR, Est African American: 112 mL/min/{1.73_m2} (ref >=60)
GFR, Est Non African American: 97 mL/min/{1.73_m2} (ref >=60)
Glucose, Bld: 107 mg/dL — ABNORMAL HIGH (ref 65–99)
Potassium: 3.9 mmol/L (ref 3.5–5.3)
Sodium: 138 mmol/L (ref 135–146)

## 2019-11-03 LAB — CK: Total CK: 33 U/L (ref 29–143)

## 2019-11-03 LAB — SEDIMENTATION RATE: Sed Rate: 39 mm/h — ABNORMAL HIGH (ref 0–30)

## 2019-11-03 LAB — TSH: TSH: 1.98 mIU/L (ref 0.40–4.50)

## 2019-11-03 LAB — C-REACTIVE PROTEIN: CRP: 15.1 mg/L — ABNORMAL HIGH (ref <8.0)

## 2019-11-03 MED ORDER — DULOXETINE HCL 30 MG PO CPEP: 30.0000 mg | ORAL_CAPSULE | Freq: Every day | ORAL | 0 refills | Status: DC

## 2019-11-04 ENCOUNTER — Encounter: Payer: Self-pay | Admitting: Internal Medicine

## 2019-11-08 ENCOUNTER — Ambulatory Visit: Payer: Medicare HMO | Admitting: Internal Medicine

## 2019-11-08 ENCOUNTER — Encounter: Payer: Self-pay | Admitting: Internal Medicine

## 2019-11-14 DIAGNOSIS — M791 Myalgia, unspecified site: Secondary | ICD-10-CM | POA: Diagnosis not present

## 2019-11-14 DIAGNOSIS — M542 Cervicalgia: Secondary | ICD-10-CM | POA: Diagnosis not present

## 2019-11-14 DIAGNOSIS — M353 Polymyalgia rheumatica: Secondary | ICD-10-CM | POA: Diagnosis not present

## 2019-11-14 DIAGNOSIS — M2550 Pain in unspecified joint: Secondary | ICD-10-CM | POA: Diagnosis not present

## 2019-11-14 DIAGNOSIS — M25519 Pain in unspecified shoulder: Secondary | ICD-10-CM | POA: Diagnosis not present

## 2019-11-14 DIAGNOSIS — M1990 Unspecified osteoarthritis, unspecified site: Secondary | ICD-10-CM | POA: Diagnosis not present

## 2019-11-14 DIAGNOSIS — M810 Age-related osteoporosis without current pathological fracture: Secondary | ICD-10-CM | POA: Diagnosis not present

## 2019-12-12 DIAGNOSIS — Z1231 Encounter for screening mammogram for malignant neoplasm of breast: Secondary | ICD-10-CM | POA: Diagnosis not present

## 2019-12-21 ENCOUNTER — Encounter: Payer: Self-pay | Admitting: Internal Medicine

## 2020-01-10 ENCOUNTER — Telehealth: Payer: Self-pay | Admitting: Internal Medicine

## 2020-01-10 MED ORDER — TRETINOIN 0.05 % EX CREA: TOPICAL_CREAM | CUTANEOUS | 1 refills | Status: DC

## 2020-01-10 NOTE — Telephone Encounter (Signed)
tretinoin (RETIN-A) 0.05 % cream [facility] [id number] [name], [place] - [place] Phone:  [phone]  Fax:  [phone]     Requesting a refill Last seen- 11/02/19 Next apt- N/A

## 2020-01-16 DIAGNOSIS — H353132 Nonexudative age-related macular degeneration, bilateral, intermediate dry stage: Secondary | ICD-10-CM | POA: Diagnosis not present

## 2020-01-16 DIAGNOSIS — H0102A Squamous blepharitis right eye, upper and lower eyelids: Secondary | ICD-10-CM | POA: Diagnosis not present

## 2020-01-16 DIAGNOSIS — H2513 Age-related nuclear cataract, bilateral: Secondary | ICD-10-CM | POA: Diagnosis not present

## 2020-01-16 DIAGNOSIS — H0102B Squamous blepharitis left eye, upper and lower eyelids: Secondary | ICD-10-CM | POA: Diagnosis not present

## 2020-01-16 DIAGNOSIS — H04123 Dry eye syndrome of bilateral lacrimal glands: Secondary | ICD-10-CM | POA: Diagnosis not present

## 2020-01-16 DIAGNOSIS — H35363 Drusen (degenerative) of macula, bilateral: Secondary | ICD-10-CM | POA: Diagnosis not present

## 2020-01-16 DIAGNOSIS — D3132 Benign neoplasm of left choroid: Secondary | ICD-10-CM | POA: Diagnosis not present

## 2020-01-18 DIAGNOSIS — R69 Illness, unspecified: Secondary | ICD-10-CM | POA: Diagnosis not present

## 2020-01-19 ENCOUNTER — Other Ambulatory Visit: Payer: Self-pay

## 2020-01-19 ENCOUNTER — Encounter: Payer: Self-pay | Admitting: Internal Medicine

## 2020-01-19 ENCOUNTER — Ambulatory Visit: Payer: Medicare HMO | Admitting: Internal Medicine

## 2020-01-19 VITALS — BP 131/72 | HR 85 | Resp 14 | Ht 63.75 in | Wt 131.4 lb

## 2020-01-19 DIAGNOSIS — M791 Myalgia, unspecified site: Secondary | ICD-10-CM | POA: Diagnosis not present

## 2020-01-19 DIAGNOSIS — M810 Age-related osteoporosis without current pathological fracture: Secondary | ICD-10-CM

## 2020-01-19 MED ORDER — PREDNISONE 5 MG PO TABS: 15.0000 mg | ORAL_TABLET | Freq: Every day | ORAL | 0 refills | Status: DC

## 2020-01-19 NOTE — Progress Notes (Addendum)
Office Visit Note  Patient: Linda [name]             Date of Birth: Oct 08, 1950           MRN: [id number]             PCP: Binnie Rail, MD Referring: Janith Lima, MD Visit Date: 01/19/2020  Subjective:   History of Present Illness: Linda [name] is a 69 y.o. female here for evaluation of myalgias and elevated serum inflammatory markers.  The symptoms started about 3 months ago beginning over a short period of time with pain primarily over the bilateral shoulders as well as the low back, hips, and knees bilaterally.  The pain is problematic throughout the day and only slightly impairs her activities.  She describes certain movements as worst such as raising arms over the head and standing up.  She has remained very physically active with regular exercise and yoga.  She is taking some Tylenol arthritis that is only slightly beneficial for her pain.  She did take steroids for a short time from neurosurgery that helped but did not have a persistent benefit in symptoms.  She does have history of episodic back pain in the past and has had previous medial meniscal tear of the left knee, but denies having chronic joint muscle pain in these distributions.  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  She also reports being under a higher amount of stress since early in the year than usual on account of caring for her 84 year old mother who recently suffered a fall and fractured with increased care needs due to this.  She denies any changes such as eye redness or vision change, oral ulcers, lymphadenopathy, rashes, fevers.  She does describe increased fatigue.  She has a lot of anxiety about medication side effects so tries to minimize these.  She was previously treated with Fosamax for osteoporosis but stopped this due to GI irritation.  Currently she is on only calcium and vitamin D supplementation and recent osteoporosis imaging reports were reviewed as listed below.  Labs  reviewed 10/2019 ESR 36 CRP 15.1  Imaging reports reviewed DXA Report R femur neck 05/2019 -2.60 05/2017 -3.10 04/2015 -3.20  R total femur 05/2019 -1.7  L femur neck 05/2019 -3.20 05/2017 -2.80 04/2015 -2.80  L total femur 05/2019 -2.50  AP total spine 05/2019 -2.20 05/2017 -1.90 04/2015 -2.50    Activities of Daily Living:  Patient reports morning stiffness for 24 hours.   Patient Reports nocturnal pain.  Difficulty dressing/grooming: Reports Difficulty climbing stairs: Reports Difficulty getting out of chair: Reports Difficulty using hands for taps, buttons, cutlery, and/or writing: Denies  Review of Systems  Constitutional: Positive for fatigue.  HENT: Positive for mouth dryness and nose dryness. Negative for mouth sores.   Eyes: Positive for dryness. Negative for pain, itching and visual disturbance.  Respiratory: Negative for cough, hemoptysis, shortness of breath and difficulty breathing.   Cardiovascular: Negative for chest pain, palpitations and swelling in legs/feet.  Gastrointestinal: Negative for abdominal pain, blood in stool, constipation and diarrhea.  Endocrine: Positive for increased urination.  Genitourinary: Negative for painful urination.  Musculoskeletal: Positive for arthralgias, joint pain, myalgias, muscle weakness, morning stiffness, muscle tenderness and myalgias. Negative for joint swelling.  Skin: Negative for color change, rash and redness.  Allergic/Immunologic: Negative for susceptible to infections.  Neurological: Positive for headaches and weakness. Negative for dizziness, numbness and memory loss.  Hematological: Negative for swollen glands.  Psychiatric/Behavioral: Positive for sleep disturbance. Negative for confusion.    PMFS History:  Patient Active Problem List   Diagnosis Date Noted  . Myalgia 11/02/2019  . Acute medial meniscal tear, left, initial encounter 02/16/2019  . Dizziness 10/26/2018  . Loss of transverse plantar arch  10/19/2018  . 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 Upper airway cough syndrome 11/17/2017  . History of colonic polyps 05/22/2014  . Hyperglycemia 09/04/2013  . Allergic rhinitis 08/06/2013  . ANXIETY STATE, UNSPECIFIED 11/29/2009  . Vitamin D deficiency 04/10/2008  . Osteoporosis 10/04/2007    Past Medical History:  Diagnosis Date  . Cellulitis of leg, right 08/27/2016  . Hyperlipidemia   . Osteopenia   . Vitamin D deficiency     Family History  Problem Relation Age of Onset  . Melanoma Father   . Hyperlipidemia Mother   . Heart disease Mother        no MI  . Diabetes Paternal Grandmother        vision loss  . Diabetes Maternal Uncle   . Colon cancer Neg Hx   . Stomach cancer Neg Hx   . Esophageal cancer Neg Hx   . Rectal cancer Neg Hx   . Stroke Neg Hx    Past Surgical History:  Procedure Laterality Date  . COLONOSCOPY  03/2011   Dr Brodie;diminuitive polyp  . G 2 P 2    . TONSILLECTOMY     Social History   Social History Narrative   Exercise:  Yoga, weights, walking      Two children, grandchildren   Immunization History  Administered Date(s) Administered  . Fluad Quad(high Dose 65+) 11/18/2018  . Influenza, High Dose Seasonal PF 04/30/2016, 12/06/2016, 12/15/2017  . Influenza,inj,Quad PF,6+ Mos 01/19/2015  . Influenza-Unspecified 12/22/2013, 12/06/2016  . PFIZER SARS-COV-2 Vaccination 04/12/2019, 05/02/2019, 12/29/2019  . Pneumococcal Conjugate-13 10/17/2015  . Pneumococcal Polysaccharide-23 01/27/2017  . Td 11/22/2004  . Tdap 10/03/2015  . Zoster 10/03/2015     Objective: Vital Signs: BP 131/72 (BP Location: Right Arm, Patient Position: Sitting, Cuff Size: Small)   Pulse 85   Resp 14   Ht 5' 3.75" (1.619 m)   Wt 131 lb 6.4 oz (59.6 kg)   BMI 22.73 kg/m    Physical Exam Eyes:     Conjunctiva/sclera: Conjunctivae normal.  Skin:    General: Skin is warm and dry.      Findings: No rash.  Neurological:     General: No focal deficit present.     Mental Status: She is alert.     Musculoskeletal Exam:  Neck full range of motion no tenderness or swelling Shoulder full passive range of motion intact, difficulty abducting overhead actively, pain with resisted abduction but strength grossly intact, tolerates internal and external rotation Elbow flexion and extension normal range of motion no tenderness or swelling normal strength No deformity, tenderness, or swelling over bilateral wrists or finger joints Mild paraspinal tenderness to palpation over upper and lower back Hip external rotation provokes lateral pain, internal rotation okay but internal rotation in flexion provokes pain radiating to the groin and knee on both sides Knees, ankles, MTPs full range of motion no tenderness or swelling, patellofemoral crepitus present bilaterally  CDAI Exam: CDAI Score: -- Patient Global: --; Provider Global: -- Swollen: --; Tender: -- Joint Exam 01/19/2020   No joint exam has been documented for this visit   There is currently no information  documented on the homunculus. Go to the Rheumatology activity and complete the homunculus joint exam.  Investigation: No additional findings.  Imaging: No results found.  Recent Labs: Lab Results  Component Value Date   WBC 6.7 11/02/2019   HGB 12.9 11/02/2019   PLT 249 11/02/2019   NA 138 11/02/2019   K 3.9 11/02/2019   CL 99 11/02/2019   CO2 30 11/02/2019   GLUCOSE 107 (H) 11/02/2019   BUN 11 11/02/2019   CREATININE 0.53 11/02/2019   BILITOT 0.6 08/03/2019   ALKPHOS 52 08/03/2019   AST 17 08/03/2019   ALT 11 08/03/2019   PROT 7.3 08/03/2019   ALBUMIN 4.6 08/03/2019   CALCIUM 10.0 11/02/2019   GFRAA 112 11/02/2019    Speciality Comments: No specialty comments available.  Procedures:  No procedures performed Allergies: Fosamax [alendronate]   Assessment / Plan:     Visit Diagnoses: Myalgia - Plan:  Sedimentation rate, C-reactive protein, predniSONE (DELTASONE) 5 MG tablet  Relatively acute onset of proximal muscle pains along with elevated serum inflammatory markers would be consistent with polymyalgia rheumatica.  She reports difficulty with motion such as overhead lifting but strength is intact on exam.  Do not appreciate any inflammatory changes of the joints.  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  Does not have generalized sensitivity to touch somewhat atypical for chest fibromyalgia/myofascial pain syndrome.  We will give a diagnostic trial of moderate dose prednisone for 1 week in follow-up.  We will also repeat inflammatory markers today.  Age-related osteoporosis without current pathological fracture  Discussed osteoporosis since increased risk of glucocorticoids worsening this if she does need long-term steroid use.  She did not tolerate bisphosphonates in the past due to gastrointestinal irritation.  No fracture history.  Based on this my next recommendation would probably be for Reclast versus denosumab and will follow up in next visit.  Orders: Orders Placed This Encounter  Procedures  . Sedimentation rate  . C-reactive protein   Meds ordered this encounter  Medications  . predniSONE (DELTASONE) 5 MG tablet    Sig: Take 3 tablets (15 mg total) by mouth daily with breakfast.    Dispense:  21 tablet    Refill:  0    Follow-Up Instructions: Return in about 6 days (around 01/25/2020) for Possible PMR 1 wk f/u.   Collier Salina, MD  Note - This record has been created using [employer].  Chart creation errors have been sought, but may not always  have been located. Such creation errors do not reflect on  the standard of medical care.

## 2020-01-19 NOTE — Patient Instructions (Addendum)
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 We will see if there is a good response to moderate dose of prednisone that usually works well for this condition. Take 3 tablets (15mg ) once daily with food and follow up next week for reassessment and plan.   Polymyalgia Rheumatica Polymyalgia rheumatica (PMR) is an inflammatory disorder that causes the muscles and joints to ache and become stiff. Sometimes, PMR leads to a more dangerous condition that can cause vision loss (temporal arteritis or giant cell arteritis). What are the causes? The exact cause of PMR is not known. What increases the risk? You are more likely to develop this condition if you are:  Female.  89 years of age or older.  Caucasian. What are the signs or symptoms? Pain and stiffness are the main symptoms of PMR. Symptoms may:  Be worse after inactivity and in the morning.  Affect your: ? Hips, buttocks, and thighs. ? Neck, arms, and shoulders. This can make it hard to raise your arms above your head. ? Hands and wrists. Other symptoms include:  Fever.  Tiredness.  Weakness.  Depression.  Decreased appetite. This may lead to weight loss. Symptoms may start slowly or suddenly. How is this diagnosed? This condition is diagnosed with your medical history and a physical exam. You may need to see a health care provider who specializes in diseases of the joints, muscles, and bones (rheumatologist). You may also have tests, including:  Blood tests.  X-rays.  Ultrasound. How is this treated? PMR usually goes away without treatment, but it may take years. Your health care provider may recommend low-dose steroids and other medicines to help manage your symptoms of pain and stiffness. Regular exercise and rest will also help your symptoms. Follow these instructions at home:   Take  over-the-counter and prescription medicines only as told by your health care provider.  Make sure to get enough rest and sleep.  Eat a healthy and nutritious diet.  Try to exercise most days of the week. Ask your health care provider what type of exercise is best for you.  Keep all follow-up visits as told by your health care provider. This is important. Contact a health care provider if:  Your symptoms do not improve with medicine.  You have side effects from steroids. These may include: ? Weight gain. ? Swelling. ? Insomnia. ? Mood changes. ? Bruising. ? High blood sugar readings, if you have diabetes. ? Higher than normal blood pressure readings, if you monitor your blood pressure. Get help right away if:  You develop symptoms of temporal arteritis, such as: ? A change in vision. ? Severe headache. ? Scalp pain. ? Jaw pain. Summary  Polymyalgia rheumatica is an inflammatory disorder that causes aching and stiffness in your muscles and joints.  The exact cause of this condition is not known.  This condition usually goes away without treatment. Your health care provider may give you low-dose steroids to help manage your pain and stiffness.  Rest and regular exercise will help the symptoms. This information is not intended to replace advice given to you by your health care provider. Make sure you discuss any questions you have with your health care provider. Document Revised: 01/14/2018 Document Reviewed: 01/14/2018 Elsevier Patient Education  2020 [employer].

## 2020-01-20 ENCOUNTER — Encounter: Payer: Self-pay | Admitting: Internal Medicine

## 2020-01-20 LAB — SEDIMENTATION RATE: Sed Rate: 38 mm/h — ABNORMAL HIGH (ref 0–30)

## 2020-01-20 LAB — C-REACTIVE PROTEIN: CRP: 26.5 mg/L — ABNORMAL HIGH (ref <8.0)

## 2020-01-20 NOTE — Progress Notes (Signed)
Lab tests showed inflammatory markers are still elevated at least as much on her previous blood tests. I recommend taking the prednisone 15mg  daily as we discussed with plan to follow up next week.

## 2020-02-11 ENCOUNTER — Other Ambulatory Visit: Payer: Medicare HMO

## 2020-02-11 DIAGNOSIS — Z20822 Contact with and (suspected) exposure to covid-19: Secondary | ICD-10-CM | POA: Diagnosis not present

## 2020-02-14 ENCOUNTER — Encounter: Payer: Self-pay | Admitting: Internal Medicine

## 2020-02-15 DIAGNOSIS — Z20822 Contact with and (suspected) exposure to covid-19: Secondary | ICD-10-CM | POA: Diagnosis not present

## 2020-02-16 LAB — NOVEL CORONAVIRUS, NAA: SARS-CoV-2, NAA: DETECTED — AB

## 2020-02-17 ENCOUNTER — Telehealth: Payer: Self-pay | Admitting: Unknown Physician Specialty

## 2020-02-17 ENCOUNTER — Telehealth: Payer: Self-pay | Admitting: Nurse Practitioner

## 2020-02-17 NOTE — Telephone Encounter (Signed)
Called to discuss with Linda [name] about Covid symptoms and the use of  monoclonal antibody infusion for those with mild to moderate Covid symptoms and at a high risk of hospitalization.     Pt does not qualify for infusion therapy as she has asymptomatic infection. Isolation precautions discussed. Advised to contact back for consideration should she develop symptoms. Patient verbalized understanding.      Patient Active Problem List   Diagnosis Date Noted  . Myalgia 11/02/2019  . Acute medial meniscal tear, left, initial encounter 02/16/2019  . Dizziness 10/26/2018  . 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 Upper airway cough syndrome 11/17/2017  . History of colonic polyps 05/22/2014  . Hyperglycemia 09/04/2013  . Allergic rhinitis 08/06/2013  . ANXIETY STATE, UNSPECIFIED 11/29/2009  . Vitamin D deficiency 04/10/2008  . Osteoporosis 10/04/2007

## 2020-02-17 NOTE — Telephone Encounter (Signed)
Called to Discuss with patient about Covid symptoms and the use of the monoclonal antibody infusion for those with mild to moderate Covid symptoms and at a high risk of hospitalization.     Pt appears to qualify for this infusion due to co-morbid conditions and/or a member of an at-risk group in accordance with the FDA Emergency Use Authorization. (Neuroma/hyperglycemia).    Unable to reach pt. Voicemail left and My Chart message sent.   Alda Lea, NP [facility]  [phone]

## 2020-02-20 ENCOUNTER — Other Ambulatory Visit: Payer: Medicare HMO

## 2020-02-20 ENCOUNTER — Telehealth: Payer: Self-pay | Admitting: Internal Medicine

## 2020-02-20 DIAGNOSIS — Z20822 Contact with and (suspected) exposure to covid-19: Secondary | ICD-10-CM

## 2020-02-20 NOTE — Telephone Encounter (Signed)
Patient is wanting the phone number of the Covid line to have a Covid PCR test. She can be reached at [phone]

## 2020-02-20 NOTE — Telephone Encounter (Signed)
Spoke with patient today. 

## 2020-02-21 ENCOUNTER — Encounter: Payer: Self-pay | Admitting: Internal Medicine

## 2020-02-21 LAB — NOVEL CORONAVIRUS, NAA: SARS-CoV-2, NAA: NOT DETECTED

## 2020-02-21 LAB — SARS-COV-2, NAA 2 DAY TAT

## 2020-02-22 NOTE — Progress Notes (Signed)
Subjective:    Patient ID: Linda [name], female    DOB: Jun 06, 1950, 69 y.o.   MRN: [id number]  HPI The patient is here for an acute visit for body aches..  She had a positive covid test, but since then had a negative covid test  She saw Dr Ronnald Ramp in August for myalgias.  Concern about fibromyalgia and he advised cymbalta and rheumatology referral.  Esr elevated. She did see rheum and was diagnosed with PMR.  She was started on prednisone 15 mg daily on 10/28.  She was to f/u in 6 days but she never started the medication and did not f/u.  Marland Kitchen   She continues to have muscle pain.  She is staking advil.  Some of her activities are harder to do because of her muscle pain.    Medications and allergies reviewed with patient and updated if appropriate.  Patient Active Problem List   Diagnosis Date Noted  . Myalgia 11/02/2019  . Acute medial meniscal tear, left, initial encounter 02/16/2019  . Dizziness 10/26/2018  . 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 Upper airway cough syndrome 11/17/2017  . History of colonic polyps 05/22/2014  . Hyperglycemia 09/04/2013  . Allergic rhinitis 08/06/2013  . ANXIETY STATE, UNSPECIFIED 11/29/2009  . Vitamin D deficiency 04/10/2008  . Osteoporosis 10/04/2007    Current Outpatient Medications on File Prior to Visit  Medication Sig Dispense Refill  . Acetaminophen (TYLENOL ARTHRITIS PAIN PO) Take by mouth.    . Ascorbic Acid (VITAMIN C) 1000 MG tablet Take 1,000 mg by mouth daily.      . Calcium Carb-Cholecalciferol (CALCIUM 1000 + D PO) Take by mouth daily.      . cholecalciferol (VITAMIN D) 1000 units tablet Take 1.5 tablets (1,500 Units total) by mouth daily. 90 tablet 2  . tretinoin (RETIN-A) 0.05 % cream APPLY A PEASIZE AMOUNT ONTO THE FACE NIGHTLY 45 g 1   No current facility-administered medications on file prior to visit.     Past Medical History:  Diagnosis Date  . Cellulitis of leg, right 08/27/2016  . Hyperlipidemia   . Osteopenia   . Vitamin D deficiency     Past Surgical History:  Procedure Laterality Date  . COLONOSCOPY  03/2011   Dr Brodie;diminuitive polyp  . G 2 P 2    . TONSILLECTOMY      Social History   Socioeconomic History  . Marital status: Married    Spouse name: Not on file  . Number of children: 2  . Years of education: Not on file  . Highest education level: Not on file  Occupational History  . Occupation: banking  Tobacco Use  . Smoking status: Never Smoker  . Smokeless tobacco: Never Used  Vaping Use  . Vaping Use: Never used  Substance and Sexual Activity  . Alcohol use: Yes    Comment:  1-2 wine or beer nightly  . Drug use: No  . Sexual activity: Not on file  Other Topics Concern  . Not on file  Social History Narrative   Exercise:  Yoga, weights, walking      Two children, grandchildren   Social Determinants of Health   Financial Resource Strain:   . Difficulty of Paying Living Expenses: Not on file  Food Insecurity:   . Worried About Charity fundraiser in the Last Year: Not on file  .  Ran Out of Food in the Last Year: Not on file  Transportation Needs:   . Lack of Transportation (Medical): Not on file  . Lack of Transportation (Non-Medical): Not on file  Physical Activity:   . Days of Exercise per Week: Not on file  . Minutes of Exercise per Session: Not on file  Stress:   . Feeling of Stress : Not on file  Social Connections:   . Frequency of Communication with Friends and Family: Not on file  . Frequency of Social Gatherings with Friends and Family: Not on file  . Attends Religious Services: Not on file  . Active Member of Clubs or Organizations: Not on file  . Attends Archivist Meetings: Not on file  . Marital Status: Not on file    Family History  Problem Relation Age of Onset  . Melanoma Father   . Hyperlipidemia Mother    . Heart disease Mother        no MI  . Diabetes Paternal Grandmother        vision loss  . Diabetes Maternal Uncle   . Colon cancer Neg Hx   . Stomach cancer Neg Hx   . Esophageal cancer Neg Hx   . Rectal cancer Neg Hx   . Stroke Neg Hx     Review of Systems  Constitutional: Negative for fever.  HENT:       No jaw pain  Eyes: Negative for visual disturbance.  Musculoskeletal: Positive for arthralgias and joint swelling.  Neurological: Negative for headaches.       Objective:   Vitals:   02/23/20 0828  BP: 130/74  Pulse: 71  Temp: 97.9 F (36.6 C)  SpO2: 98%   BP Readings from Last 3 Encounters:  02/23/20 130/74  01/19/20 131/72  11/02/19 136/86   Wt Readings from Last 3 Encounters:  02/23/20 128 lb (58.1 kg)  01/19/20 131 lb 6.4 oz (59.6 kg)  11/02/19 138 lb 4 oz (62.7 kg)   Body mass index is 22.14 kg/m.   Physical Exam    Constitutional: Appears well-developed and well-nourished. No distress.  Cardiovascular: Normal rate, regular rhythm and normal heart sounds.  No murmur heard. No carotid bruit .  No edema Pulmonary/Chest: Effort normal and breath sounds normal. No respiratory distress. No has no wheezes. No rales.  Msk: tenderness with palpation b/l proximal UE and LE muscles Skin: Skin is warm and dry. Not diaphoretic.  Psychiatric: Normal mood and affect. Behavior is normal.     Assessment & Plan:    See Problem List for Assessment and Plan of chronic medical problems.    This visit occurred during the SARS-CoV-2 public health emergency.  Safety protocols were in place, including screening questions prior to the visit, additional usage of staff PPE, and extensive cleaning of exam room while observing appropriate contact time as indicated for disinfecting solutions.

## 2020-02-23 ENCOUNTER — Encounter: Payer: Self-pay | Admitting: Internal Medicine

## 2020-02-23 ENCOUNTER — Other Ambulatory Visit: Payer: Self-pay

## 2020-02-23 ENCOUNTER — Telehealth: Payer: Self-pay | Admitting: Internal Medicine

## 2020-02-23 ENCOUNTER — Ambulatory Visit (INDEPENDENT_AMBULATORY_CARE_PROVIDER_SITE_OTHER): Payer: Medicare HMO | Admitting: Internal Medicine

## 2020-02-23 DIAGNOSIS — M353 Polymyalgia rheumatica: Secondary | ICD-10-CM | POA: Insufficient documentation

## 2020-02-23 NOTE — Telephone Encounter (Signed)
Patient has decided to start Prednisone. Patient needs to know if she is to schedule an appointment for 6 days after she starts medication, or just have labs done in six days? Please advise.

## 2020-02-23 NOTE — Patient Instructions (Addendum)
Follow up with Dr Benjamine Mola.      Polymyalgia Rheumatica Polymyalgia rheumatica (PMR) is an inflammatory disorder that causes the muscles and joints to ache and become stiff. Sometimes, PMR leads to a more dangerous condition that can cause vision loss (temporal arteritis or giant cell arteritis). What are the causes? The exact cause of PMR is not known. What increases the risk? You are more likely to develop this condition if you are:  Female.  69 years of age or older.  Caucasian. What are the signs or symptoms? Pain and stiffness are the main symptoms of PMR. Symptoms may:  Be worse after inactivity and in the morning.  Affect your: ? Hips, buttocks, and thighs. ? Neck, arms, and shoulders. This can make it hard to raise your arms above your head. ? Hands and wrists. Other symptoms include:  Fever.  Tiredness.  Weakness.  Depression.  Decreased appetite. This may lead to weight loss. Symptoms may start slowly or suddenly. How is this diagnosed? This condition is diagnosed with your medical history and a physical exam. You may need to see a health care provider who specializes in diseases of the joints, muscles, and bones (rheumatologist). You may also have tests, including:  Blood tests.  X-rays.  Ultrasound. How is this treated? PMR usually goes away without treatment, but it may take years. Your health care provider may recommend low-dose steroids and other medicines to help manage your symptoms of pain and stiffness. Regular exercise and rest will also help your symptoms. Follow these instructions at home:   Take over-the-counter and prescription medicines only as told by your health care provider.  Make sure to get enough rest and sleep.  Eat a healthy and nutritious diet.  Try to exercise most days of the week. Ask your health care provider what type of exercise is best for you.  Keep all follow-up visits as told by your health care provider. This is  important. Contact a health care provider if:  Your symptoms do not improve with medicine.  You have side effects from steroids. These may include: ? Weight gain. ? Swelling. ? Insomnia. ? Mood changes. ? Bruising. ? High blood sugar readings, if you have diabetes. ? Higher than normal blood pressure readings, if you monitor your blood pressure. Get help right away if:  You develop symptoms of temporal arteritis, such as: ? A change in vision. ? Severe headache. ? Scalp pain. ? Jaw pain. Summary  Polymyalgia rheumatica is an inflammatory disorder that causes aching and stiffness in your muscles and joints.  The exact cause of this condition is not known.  This condition usually goes away without treatment. Your health care provider may give you low-dose steroids to help manage your pain and stiffness.  Rest and regular exercise will help the symptoms. This information is not intended to replace advice given to you by your health care provider. Make sure you discuss any questions you have with your health care provider. Document Revised: 01/14/2018 Document Reviewed: 01/14/2018 Elsevier Patient Education  2020 [employer].

## 2020-02-23 NOTE — Assessment & Plan Note (Signed)
Subacute Discussed the treatment of this is necessary and will require steroids  Advised she f/u with Dr Benjamine Mola and start the steroids prescribed

## 2020-02-24 ENCOUNTER — Ambulatory Visit: Payer: Medicare HMO | Admitting: Internal Medicine

## 2020-02-24 ENCOUNTER — Other Ambulatory Visit: Payer: Self-pay | Admitting: Internal Medicine

## 2020-02-24 DIAGNOSIS — M791 Myalgia, unspecified site: Secondary | ICD-10-CM

## 2020-02-24 NOTE — Telephone Encounter (Signed)
I want her to have labs collected at that time to see if her high inflammatory markers are normal after starting prednisone. I will place future order she can just have that collected I can call her to follow up after seeing it.

## 2020-02-24 NOTE — Telephone Encounter (Signed)
I advised patient she will just need labs 6 days after starting Prednisone. Patient's spouse is in the hospital now, and she will hold off on starting medication until he is better, but will follow up for labs after that. Patient was advised of lab hours.

## 2020-02-27 ENCOUNTER — Ambulatory Visit: Payer: Medicare HMO | Admitting: Internal Medicine

## 2020-03-07 ENCOUNTER — Other Ambulatory Visit: Payer: Self-pay

## 2020-03-07 DIAGNOSIS — M791 Myalgia, unspecified site: Secondary | ICD-10-CM | POA: Diagnosis not present

## 2020-03-08 ENCOUNTER — Telehealth: Payer: Self-pay

## 2020-03-08 DIAGNOSIS — M353 Polymyalgia rheumatica: Secondary | ICD-10-CM

## 2020-03-08 LAB — C-REACTIVE PROTEIN: CRP: 2.1 mg/L (ref <8.0)

## 2020-03-08 LAB — SEDIMENTATION RATE: Sed Rate: 14 mm/h (ref 0–30)

## 2020-03-08 MED ORDER — PREDNISONE 5 MG PO TABS: ORAL_TABLET | ORAL | 0 refills | Status: DC

## 2020-03-08 NOTE — Telephone Encounter (Signed)
Please contact Ms. Linda [name] about scheduling follow up in 8 weeks for PMR?

## 2020-03-08 NOTE — Telephone Encounter (Signed)
Patient called stating she reviewed the results of her labwork on her Mychart.  Patient is requesting a return call to let her know if she should continue taking Prednisone.

## 2020-03-08 NOTE — Telephone Encounter (Signed)
Patient called again in reference to lab results. Patient has seen them on My Chart, and would like to know if she is to stay on Prednisone. Patient is leaving to go out of town tomorrow, and would like to know before then. Please call to advise.

## 2020-03-08 NOTE — Telephone Encounter (Signed)
I spoke with Ms. Koffler she did notice a large improvement in symptoms with the 15mg  prednisone and repeat labs showed normalization of inflammatory markers. This does sound consistent with PMR. She is concerned about side effects of long term prednisone use which is reasonable. PMR symptoms are likely to return with too rapid discontinuation of the medicine so I think the fastest tapering regimen I would attempt will be 10mg  for 2 weeks then decreasing by 2.5mg  daily dose every 2 weeks for a total of 2 months. We can follow up at clinic after this to see if symptoms and labs are improved. If symptoms flare up during the tapering we may have to go more slowly or consider a steroid sparing medication.

## 2020-03-08 NOTE — Telephone Encounter (Signed)
Spoke with patient, advised I would discuss with Dr. Benjamine Mola and be in touch with her regarding the Prednisone. Per patient she would prefer to come off Prednisone if possible. Patient is leaving to go out of town around [phone] tomorrow morning and would like an answer beforehand if possible.

## 2020-03-09 NOTE — Telephone Encounter (Signed)
Attempted to contact patient to schedule 8 week follow up appointment, left voicemail advising patient to call the office to schedule.

## 2020-03-12 ENCOUNTER — Other Ambulatory Visit: Payer: Medicare HMO

## 2020-03-12 DIAGNOSIS — Z20822 Contact with and (suspected) exposure to covid-19: Secondary | ICD-10-CM | POA: Diagnosis not present

## 2020-03-14 LAB — SARS-COV-2, NAA 2 DAY TAT

## 2020-03-14 LAB — NOVEL CORONAVIRUS, NAA: SARS-CoV-2, NAA: NOT DETECTED

## 2020-04-17 ENCOUNTER — Ambulatory Visit: Payer: Medicare HMO | Admitting: Rheumatology

## 2020-04-18 ENCOUNTER — Other Ambulatory Visit: Payer: Self-pay | Admitting: Internal Medicine

## 2020-04-18 DIAGNOSIS — M353 Polymyalgia rheumatica: Secondary | ICD-10-CM

## 2020-04-18 NOTE — Telephone Encounter (Signed)
Last Visit: 01/19/2020 Next Visit: 05/04/2020 Labs: 03/07/2020 Sed Rate: 14 CRP: 2.1  Current Dose per office note 01/19/2020: predniSONE (DELTASONE) 5 MG tablet DX: Myalgia  Okay to refill Prednisone?

## 2020-04-28 ENCOUNTER — Other Ambulatory Visit: Payer: Medicare HMO

## 2020-04-28 DIAGNOSIS — Z20822 Contact with and (suspected) exposure to covid-19: Secondary | ICD-10-CM | POA: Diagnosis not present

## 2020-04-29 LAB — SARS-COV-2, NAA 2 DAY TAT

## 2020-04-29 LAB — NOVEL CORONAVIRUS, NAA: SARS-CoV-2, NAA: NOT DETECTED

## 2020-04-30 DIAGNOSIS — Z0100 Encounter for examination of eyes and vision without abnormal findings: Secondary | ICD-10-CM | POA: Diagnosis not present

## 2020-05-01 ENCOUNTER — Ambulatory Visit: Payer: Medicare HMO | Admitting: Internal Medicine

## 2020-05-01 ENCOUNTER — Encounter: Payer: Self-pay | Admitting: Internal Medicine

## 2020-05-01 ENCOUNTER — Other Ambulatory Visit: Payer: Self-pay

## 2020-05-01 VITALS — BP 125/72 | HR 72 | Ht 64.0 in | Wt 134.0 lb

## 2020-05-01 DIAGNOSIS — E559 Vitamin D deficiency, unspecified: Secondary | ICD-10-CM

## 2020-05-01 DIAGNOSIS — M810 Age-related osteoporosis without current pathological fracture: Secondary | ICD-10-CM | POA: Diagnosis not present

## 2020-05-01 DIAGNOSIS — M353 Polymyalgia rheumatica: Secondary | ICD-10-CM

## 2020-05-01 NOTE — Progress Notes (Signed)
Office Visit Note  Patient: Linda [name]             Date of Birth: 10-15-1950           MRN: [id number]             PCP: Binnie Rail, MD Referring: Binnie Rail, MD Visit Date: 05/01/2020   Subjective:   History of Present Illness: TONEISHA SAVARY is a 70 y.o. female here for follow up of PMR. Since starting prednisone she experienced very large improvement in muscle aches and stiffness. She is tapering down the prednisone, since lowering this to 2.66m daily dose almost a month ago she has noticed a bit more pain returning. Otherwise no major events, no specific side effects or problems with the medication.     Review of Systems  Constitutional: Negative for fatigue.  HENT: Negative for mouth sores, mouth dryness and nose dryness.   Eyes: Negative for pain, itching, visual disturbance and dryness.  Respiratory: Negative for cough, hemoptysis, shortness of breath and difficulty breathing.   Cardiovascular: Negative for chest pain, palpitations and swelling in legs/feet.  Gastrointestinal: Negative for abdominal pain, blood in stool, constipation and diarrhea.  Endocrine: Negative for increased urination.  Genitourinary: Negative for painful urination.  Musculoskeletal: Positive for arthralgias, joint pain, myalgias, muscle weakness, muscle tenderness and myalgias. Negative for joint swelling and morning stiffness.  Skin: Negative for color change, rash and redness.  Allergic/Immunologic: Negative for susceptible to infections.  Neurological: Negative for dizziness, numbness, headaches, memory loss and weakness.  Hematological: Negative for swollen glands.  Psychiatric/Behavioral: Positive for sleep disturbance. Negative for confusion.    PMFS History:  Patient Active Problem List   Diagnosis Date Noted   PMR (polymyalgia rheumatica) [place] 02/23/2020   Acute medial meniscal tear, left, initial encounter 02/16/2019   Dizziness 10/26/2018   Loss of transverse plantar  arch 10/19/2018   Neuroma of foot 10/19/2018   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   SI (sacroiliac) joint dysfunction 12/16/2017   Upper airway cough syndrome 11/17/2017   History of colonic polyps 05/22/2014   Hyperglycemia 09/04/2013   Allergic rhinitis 08/06/2013   ANXIETY STATE, UNSPECIFIED 11/29/2009   Vitamin D deficiency 04/10/2008   Osteoporosis 10/04/2007    Past Medical History:  Diagnosis Date   Cellulitis of leg, right 08/27/2016   Hyperlipidemia    Osteopenia    Vitamin D deficiency     Family History  Problem Relation Age of Onset   Melanoma Father    Hyperlipidemia Mother    Heart disease Mother        no MI   Diabetes Paternal Grandmother        vision loss   Diabetes Maternal Uncle    Colon cancer Neg Hx    Stomach cancer Neg Hx    Esophageal cancer Neg Hx    Rectal cancer Neg Hx    Stroke Neg Hx    Past Surgical History:  Procedure Laterality Date   COLONOSCOPY  03/2011   Dr Brodie;diminuitive polyp   G 2 P 2     TONSILLECTOMY     Social History   Social History Narrative   Exercise:  Yoga, weights, walking      Two children, grandchildren   Immunization History  Administered Date(s) Administered   Fluad Quad(high Dose 65+) 11/18/2018   Influenza, High Dose Seasonal PF 04/30/2016, 12/06/2016, 12/15/2017, 12/29/2019   Influenza,inj,Quad PF,6+ Mos 01/19/2015   Influenza-Unspecified 12/22/2013, 12/06/2016, 11/17/2019   PFIZER(Purple Top)SARS-COV-2  Vaccination 04/12/2019, 05/02/2019, 12/29/2019   Pneumococcal Conjugate-13 10/17/2015   Pneumococcal Polysaccharide-23 01/27/2017   Td 11/22/2004   Tdap 10/03/2015   Zoster 10/03/2015     Objective: Vital Signs: BP 125/72 (BP Location: Left Arm, Patient Position: Sitting, Cuff Size: Normal)    Pulse 72    Ht '5\' 4"'  (1.626 m)    Wt 134 lb (60.8 kg)    BMI 23.00 kg/m    Physical Exam HENT:     Right Ear: External ear normal.     Left Ear: External ear normal.  Eyes:      Conjunctiva/sclera: Conjunctivae normal.  Skin:    General: Skin is warm and dry.     Findings: No rash.  Neurological:     General: No focal deficit present.     Mental Status: She is alert.  Psychiatric:        Mood and Affect: Mood normal.     Musculoskeletal Exam:  Neck full range of motion no tenderness Shoulders slightly restricted abduction with good internal and external rotation, tenderness over trapezius muscles none over shoulder joint, normal strength some pain above scapulae with resisted abduction Elbow, wrist, fingers full range of motion no tenderness or swelling Normal hip internal and external rotation without pain, no tenderness to lateral hip palpation Knees, ankles full range of motion no tenderness, right knee slightly swollen or enlarged relative to left  Investigation: No additional findings.  Imaging: No results found.  Recent Labs: Lab Results  Component Value Date   WBC 6.7 11/02/2019   HGB 12.9 11/02/2019   PLT 249 11/02/2019   NA 138 11/02/2019   K 3.9 11/02/2019   CL 99 11/02/2019   CO2 30 11/02/2019   GLUCOSE 107 (H) 11/02/2019   BUN 11 11/02/2019   CREATININE 0.53 11/02/2019   BILITOT 0.6 08/03/2019   ALKPHOS 52 08/03/2019   AST 17 08/03/2019   ALT 11 08/03/2019   PROT 7.3 08/03/2019   ALBUMIN 4.6 08/03/2019   CALCIUM 10.0 11/02/2019   GFRAA 112 11/02/2019    Speciality Comments: No specialty comments available.  Procedures:  No procedures performed Allergies: Fosamax [alendronate]   Assessment / Plan:     Visit Diagnoses: PMR (polymyalgia rheumatica) ([place]) - Plan: Sedimentation rate, C-reactive protein, predniSONE (DELTASONE) 5 MG tablet  Seems to have a large clinical response this overall seems very consistent with PMR based on symptoms, distribution, treatment response. Not sure if the pain in past month is disease activity or just overall OA and other problems being unmasked. Will check ESR, CRP today if high recommend  going back to previous dose otherwise can continue 2.5 and f/u, taper is generally over several more months.  Vitamin D deficiency  Currently supplementing 1,500 IUs daily with normal most recent value.  Age-related osteoporosis without current pathological fracture  Osteoporosis, did not tolerate alendronate and not interesting in starting new pharmacotherapy at this time. Discussed risks with long term prednisone and fractures, will continue trying to taper.  Orders: Orders Placed This Encounter  Procedures   Sedimentation rate   C-reactive protein   Meds ordered this encounter  Medications   predniSONE (DELTASONE) 5 MG tablet    Sig: Take 0.5 tablets (2.5 mg total) by mouth daily with breakfast.    Dispense:  30 tablet    Refill:  0     Follow-Up Instructions: Return in about 3 months (around 07/29/2020) for PMR f/u.   Collier Salina, MD  Note - This record  has been created using [employer].  Chart creation errors have been sought, but may not always  have been located. Such creation errors do not reflect on  the standard of medical care.

## 2020-05-01 NOTE — Patient Instructions (Signed)
Will review inflammatory labs and let you know recommendation to continue tapering the prednisone or go back to previous dose. We may need to use 1mg  prednisone tablets for smaller incremental decrease if 2.5 is discontinuing too fast.

## 2020-05-02 LAB — C-REACTIVE PROTEIN: CRP: 2.2 mg/L (ref <8.0)

## 2020-05-02 LAB — SEDIMENTATION RATE: Sed Rate: 6 mm/h (ref 0–30)

## 2020-05-02 MED ORDER — PREDNISONE 5 MG PO TABS: 2.5000 mg | ORAL_TABLET | Freq: Every day | ORAL | 0 refills | Status: DC

## 2020-05-02 NOTE — Progress Notes (Signed)
Labs show inflammatory markers are still decreased at this time. Based on this would recommend staying at the current prednisone dose for the next month do not see strong reason to go backwards. Would like to see or she can call after that if still going well can decrease further after another month.

## 2020-05-04 ENCOUNTER — Ambulatory Visit: Payer: Medicare HMO | Admitting: Internal Medicine

## 2020-05-28 NOTE — Progress Notes (Signed)
Office Visit Note  Patient: Linda [name]             Date of Birth: [birth year]           MRN: [id number]             PCP: Binnie Rail, MD Referring: Binnie Rail, MD Visit Date: 05/29/2020   Subjective:   History of Present Illness: Linda [name] is a 70 y.o. female here for follow up for PMR currently on prednisone 2.5 mg PO daily. At her visit last month she was noticing some worsening of symptoms but inflammatory lab markers remained improved so just held at the present dose for 4 wks to reassess. She feels about the same as at last visit, still having some pain and stiffness but manageable and can do all activities that she needs to do. She is interested in stopping the medication.   Review of Systems  Constitutional: Negative for fatigue.  HENT: Negative for mouth sores, mouth dryness and nose dryness.   Eyes: Negative for pain, itching and dryness.  Respiratory: Negative for shortness of breath and difficulty breathing.   Cardiovascular: Negative for chest pain and palpitations.  Gastrointestinal: Negative for blood in stool, constipation and diarrhea.  Endocrine: Negative for increased urination.  Genitourinary: Negative for difficulty urinating.  Musculoskeletal: Positive for arthralgias, joint pain, myalgias, morning stiffness, muscle tenderness and myalgias. Negative for joint swelling.  Skin: Negative for color change, rash and redness.  Allergic/Immunologic: Negative for susceptible to infections.  Neurological: Negative for dizziness, numbness, headaches, memory loss and weakness.  Hematological: Negative for bruising/bleeding tendency.  Psychiatric/Behavioral: Positive for sleep disturbance. Negative for confusion.     Previous HPI: Linda [name] is a 70 y.o. female here for follow up of PMR. Since starting prednisone she experienced very large improvement in muscle aches and stiffness. She is tapering down the prednisone, since lowering this to 2.5mg   daily dose almost a month ago she has noticed a bit more pain returning. Otherwise no major events, no specific side effects or problems with the medication.    PMFS History:  Patient Active Problem List   Diagnosis Date Noted  . PMR (polymyalgia rheumatica) ([place]) 02/23/2020  . Acute medial meniscal tear, left, initial encounter 02/16/2019  . Dizziness 10/26/2018  . 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 Upper airway cough syndrome 11/17/2017  . History of colonic polyps 05/22/2014  . Hyperglycemia 09/04/2013  . Allergic rhinitis 08/06/2013  . ANXIETY STATE, UNSPECIFIED 11/29/2009  . Vitamin D deficiency 04/10/2008  . Osteoporosis 10/04/2007    Past Medical History:  Diagnosis Date  . Cellulitis of leg, right 08/27/2016  . Hyperlipidemia   . Osteopenia   . Vitamin D deficiency     Family History  Problem Relation Age of Onset  . Melanoma Father   . Hyperlipidemia Mother   . Heart disease Mother        no MI  . Diabetes Paternal Grandmother        vision loss  . Diabetes Maternal Uncle   . Colon cancer Neg Hx   . Stomach cancer Neg Hx   . Esophageal cancer Neg Hx   . Rectal cancer Neg Hx   . Stroke Neg Hx    Past Surgical History:  Procedure Laterality Date  . COLONOSCOPY  03/2011   Dr Brodie;diminuitive polyp  .  G 2 P 2    . TONSILLECTOMY     Social History   Social History Narrative   Exercise:  Yoga, weights, walking      Two children, grandchildren   Immunization History  Administered Date(s) Administered  . Fluad Quad(high Dose 65+) 11/18/2018  . Influenza, High Dose Seasonal PF 04/30/2016, 12/06/2016, 12/15/2017, 12/29/2019  . Influenza,inj,Quad PF,6+ Mos 01/19/2015  . Influenza-Unspecified 12/22/2013, 12/06/2016, 11/17/2019  . PFIZER(Purple Top)SARS-COV-2 Vaccination 04/12/2019, 05/02/2019, 12/29/2019  . Pneumococcal Conjugate-13  10/17/2015  . Pneumococcal Polysaccharide-23 01/27/2017  . Td 11/22/2004  . Tdap 10/03/2015  . Zoster 10/03/2015     Objective: Vital Signs: BP 128/79 (BP Location: Left Arm, Patient Position: Sitting, Cuff Size: Normal)   Pulse 70   Ht 5\' 4"  (1.626 m)   Wt 130 lb (59 kg)   BMI 22.31 kg/m    Physical Exam HENT:     Right Ear: External ear normal.  Eyes:     Conjunctiva/sclera: Conjunctivae normal.  Cardiovascular:     Rate and Rhythm: Normal rate and regular rhythm.  Pulmonary:     Effort: Pulmonary effort is normal.     Breath sounds: Normal breath sounds.  Skin:    General: Skin is warm and dry.     Findings: No rash.  Neurological:     General: No focal deficit present.     Mental Status: She is alert.  Psychiatric:        Mood and Affect: Mood normal.     Musculoskeletal Exam:  Shoulders full ROM no tenderness or swelling Elbows full ROM no tenderness or swelling Wrists full ROM no tenderness or swelling Fingers full ROM no tenderness or swelling Hip normal internal and external rotation without pain, no tenderness to lateral hip palpation Knees right larger than left, no effusions palpable, full ROM, patellofemoral crepitus b/l   Investigation: No additional findings.  Imaging: No results found.  Recent Labs: Lab Results  Component Value Date   WBC 6.7 11/02/2019   HGB 12.9 11/02/2019   PLT 249 11/02/2019   NA 138 11/02/2019   K 3.9 11/02/2019   CL 99 11/02/2019   CO2 30 11/02/2019   GLUCOSE 107 (H) 11/02/2019   BUN 11 11/02/2019   CREATININE 0.53 11/02/2019   BILITOT 0.6 08/03/2019   ALKPHOS 52 08/03/2019   AST 17 08/03/2019   ALT 11 08/03/2019   PROT 7.3 08/03/2019   ALBUMIN 4.6 08/03/2019   CALCIUM 10.0 11/02/2019   GFRAA 112 11/02/2019    Speciality Comments: No specialty comments available.  Procedures:  No procedures performed Allergies: Fosamax [alendronate]   Assessment / Plan:     Visit Diagnoses: PMR (polymyalgia  rheumatica) ([facility])  Shoulder and hip stiffness may still reflect some activity but she is stable on very minimal dose and inflammatory serology is all normal. No signs of symptoms for GCA. She will stop prednisone and if symptoms do not severely worsen no additional treatment needed, may be self limited. Can f/u as needed if worsens again or new problems.  Osteoporosis  We discussed osteoporosis she is taking vitamin D and exercising mostly walking and remains not interested in medication treatment for this due to side effects.  Orders: No orders of the defined types were placed in this encounter.  No orders of the defined types were placed in this encounter.    Follow-Up Instructions: Return if symptoms worsen or fail to improve.   Collier Salina, MD  Note - This record has  been created using [employer].  Chart creation errors have been sought, but may not always  have been located. Such creation errors do not reflect on  the standard of medical care.

## 2020-05-29 ENCOUNTER — Other Ambulatory Visit: Payer: Self-pay

## 2020-05-29 ENCOUNTER — Ambulatory Visit: Payer: Medicare HMO | Admitting: Internal Medicine

## 2020-05-29 ENCOUNTER — Encounter: Payer: Self-pay | Admitting: Internal Medicine

## 2020-05-29 VITALS — BP 128/79 | HR 70 | Ht 64.0 in | Wt 130.0 lb

## 2020-05-29 DIAGNOSIS — M810 Age-related osteoporosis without current pathological fracture: Secondary | ICD-10-CM | POA: Diagnosis not present

## 2020-05-29 DIAGNOSIS — M353 Polymyalgia rheumatica: Secondary | ICD-10-CM | POA: Diagnosis not present

## 2020-06-08 DIAGNOSIS — Z20822 Contact with and (suspected) exposure to covid-19: Secondary | ICD-10-CM | POA: Diagnosis not present

## 2020-06-08 DIAGNOSIS — Z03818 Encounter for observation for suspected exposure to other biological agents ruled out: Secondary | ICD-10-CM | POA: Diagnosis not present

## 2020-06-11 DIAGNOSIS — H10012 Acute follicular conjunctivitis, left eye: Secondary | ICD-10-CM | POA: Diagnosis not present

## 2020-06-19 DIAGNOSIS — Z20822 Contact with and (suspected) exposure to covid-19: Secondary | ICD-10-CM | POA: Diagnosis not present

## 2020-06-19 DIAGNOSIS — Z03818 Encounter for observation for suspected exposure to other biological agents ruled out: Secondary | ICD-10-CM | POA: Diagnosis not present

## 2020-06-27 DIAGNOSIS — Z01419 Encounter for gynecological examination (general) (routine) without abnormal findings: Secondary | ICD-10-CM | POA: Diagnosis not present

## 2020-06-27 DIAGNOSIS — M810 Age-related osteoporosis without current pathological fracture: Secondary | ICD-10-CM | POA: Diagnosis not present

## 2020-06-27 DIAGNOSIS — Z6822 Body mass index (BMI) 22.0-22.9, adult: Secondary | ICD-10-CM | POA: Diagnosis not present

## 2020-06-27 DIAGNOSIS — Z124 Encounter for screening for malignant neoplasm of cervix: Secondary | ICD-10-CM | POA: Diagnosis not present

## 2020-06-27 DIAGNOSIS — Z01411 Encounter for gynecological examination (general) (routine) with abnormal findings: Secondary | ICD-10-CM | POA: Diagnosis not present

## 2020-07-03 DIAGNOSIS — Z03818 Encounter for observation for suspected exposure to other biological agents ruled out: Secondary | ICD-10-CM | POA: Diagnosis not present

## 2020-07-03 DIAGNOSIS — Z20822 Contact with and (suspected) exposure to covid-19: Secondary | ICD-10-CM | POA: Diagnosis not present

## 2020-08-06 DIAGNOSIS — D225 Melanocytic nevi of trunk: Secondary | ICD-10-CM | POA: Diagnosis not present

## 2020-08-06 DIAGNOSIS — D2272 Melanocytic nevi of left lower limb, including hip: Secondary | ICD-10-CM | POA: Diagnosis not present

## 2020-08-06 DIAGNOSIS — D2271 Melanocytic nevi of right lower limb, including hip: Secondary | ICD-10-CM | POA: Diagnosis not present

## 2020-08-06 DIAGNOSIS — L720 Epidermal cyst: Secondary | ICD-10-CM | POA: Diagnosis not present

## 2020-08-06 DIAGNOSIS — R21 Rash and other nonspecific skin eruption: Secondary | ICD-10-CM | POA: Diagnosis not present

## 2020-08-06 DIAGNOSIS — L821 Other seborrheic keratosis: Secondary | ICD-10-CM | POA: Diagnosis not present

## 2020-08-08 ENCOUNTER — Other Ambulatory Visit (HOSPITAL_BASED_OUTPATIENT_CLINIC_OR_DEPARTMENT_OTHER): Payer: Self-pay

## 2020-08-08 ENCOUNTER — Telehealth: Payer: Self-pay | Admitting: Internal Medicine

## 2020-08-08 ENCOUNTER — Other Ambulatory Visit: Payer: Self-pay

## 2020-08-08 ENCOUNTER — Emergency Department (HOSPITAL_BASED_OUTPATIENT_CLINIC_OR_DEPARTMENT_OTHER): Payer: Medicare HMO | Admitting: Radiology

## 2020-08-08 ENCOUNTER — Emergency Department (HOSPITAL_BASED_OUTPATIENT_CLINIC_OR_DEPARTMENT_OTHER)
Admission: EM | Admit: 2020-08-08 | Discharge: 2020-08-08 | Disposition: A | Discharge status: Home / Self Care | Payer: Medicare HMO | Attending: Emergency Medicine | Admitting: Emergency Medicine

## 2020-08-08 ENCOUNTER — Emergency Department (HOSPITAL_BASED_OUTPATIENT_CLINIC_OR_DEPARTMENT_OTHER): Payer: Medicare HMO

## 2020-08-08 ENCOUNTER — Encounter (HOSPITAL_BASED_OUTPATIENT_CLINIC_OR_DEPARTMENT_OTHER): Payer: Self-pay

## 2020-08-08 DIAGNOSIS — J181 Lobar pneumonia, unspecified organism: Secondary | ICD-10-CM | POA: Insufficient documentation

## 2020-08-08 DIAGNOSIS — J189 Pneumonia, unspecified organism: Secondary | ICD-10-CM

## 2020-08-08 DIAGNOSIS — R911 Solitary pulmonary nodule: Secondary | ICD-10-CM | POA: Diagnosis not present

## 2020-08-08 DIAGNOSIS — R0789 Other chest pain: Secondary | ICD-10-CM | POA: Diagnosis not present

## 2020-08-08 DIAGNOSIS — R079 Chest pain, unspecified: Secondary | ICD-10-CM | POA: Diagnosis not present

## 2020-08-08 LAB — BASIC METABOLIC PANEL
Anion gap: 9 (ref 5–15)
BUN: 12 mg/dL (ref 8–23)
CO2: 27 mmol/L (ref 22–32)
Calcium: 9.4 mg/dL (ref 8.9–10.3)
Chloride: 101 mmol/L (ref 98–111)
Creatinine, Ser: 0.47 mg/dL (ref 0.44–1.00)
GFR, Estimated: >60 mL/min (ref >60)
Glucose, Bld: 107 mg/dL — ABNORMAL HIGH (ref 70–99)
Potassium: 3.9 mmol/L (ref 3.5–5.1)
Sodium: 137 mmol/L (ref 135–145)

## 2020-08-08 LAB — CBC
HCT: 37.3 % (ref 36.0–46.0)
Hemoglobin: 12.7 g/dL (ref 12.0–15.0)
MCH: 30.1 pg (ref 26.0–34.0)
MCHC: 34 g/dL (ref 30.0–36.0)
MCV: 88.4 fL (ref 80.0–100.0)
Platelets: 214 10*3/uL (ref 150–400)
RBC: 4.22 MIL/uL (ref 3.87–5.11)
RDW: 12.8 % (ref 11.5–15.5)
WBC: 6.4 10*3/uL (ref 4.0–10.5)
nRBC: 0 % (ref 0.0–0.2)

## 2020-08-08 LAB — D-DIMER, QUANTITATIVE: D-Dimer, Quant: 0.28 ug/mL-FEU (ref 0.00–0.50)

## 2020-08-08 LAB — TROPONIN I (HIGH SENSITIVITY)
Troponin I (High Sensitivity): <2 ng/L (ref <18)
Troponin I (High Sensitivity): <2 ng/L (ref <18)

## 2020-08-08 MED ORDER — AZITHROMYCIN 250 MG PO TABS
250.0000 mg | ORAL_TABLET | Freq: Every day | ORAL | 0 refills | Status: DC
  Filled 2020-08-08: qty 6, 6d supply, fill #0

## 2020-08-08 NOTE — ED Notes (Signed)
Patient transported to X-ray 

## 2020-08-08 NOTE — ED Provider Notes (Signed)
[facility] DEPT Provider Note   CSN: [id number] Arrival date & time: 08/08/20  1012     History Chief Complaint  Patient presents with  . Chest Pain    Linda [name] is a 70 y.o. female.  Patient presenting with a complaint of right-sided chest thoracic area pain which is worse with movement since yesterday.  Also worse with taking a deep breath.  Patient in no distress.  Denies any injury or overuse.  Denies any fevers cough chills or significant shortness of breath with it.  Temp is 98.6 respirations 18 room air sats 100% heart rate 92.  Blood pressure 137/84.  Past medical history significant for hyperlipidemia and vitamin D deficiency.  And patient has a history of polymyalgia rheumatica.        Past Medical History:  Diagnosis Date  . Cellulitis of leg, right 08/27/2016  . Hyperlipidemia   . Osteopenia   . Vitamin D deficiency     Patient Active Problem List   Diagnosis Date Noted  . PMR (polymyalgia rheumatica) ([place]) 02/23/2020  . Acute medial meniscal tear, left, initial encounter 02/16/2019  . Dizziness 10/26/2018  . 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 Upper airway cough syndrome 11/17/2017  . History of colonic polyps 05/22/2014  . Hyperglycemia 09/04/2013  . Allergic rhinitis 08/06/2013  . ANXIETY STATE, UNSPECIFIED 11/29/2009  . Vitamin D deficiency 04/10/2008  . Osteoporosis 10/04/2007    Past Surgical History:  Procedure Laterality Date  . COLONOSCOPY  03/2011   Dr Brodie;diminuitive polyp  . G 2 P 2    . TONSILLECTOMY       OB History    Gravida  2   Para  2   Term      Preterm      AB      Living        SAB      IAB      Ectopic      Multiple      Live Births              Family History  Problem Relation Age of Onset  . Melanoma Father   . Hyperlipidemia Mother   . Heart  disease Mother        no MI  . Diabetes Paternal Grandmother        vision loss  . Diabetes Maternal Uncle   . Colon cancer Neg Hx   . Stomach cancer Neg Hx   . Esophageal cancer Neg Hx   . Rectal cancer Neg Hx   . Stroke Neg Hx     Social History   Tobacco Use  . Smoking status: Never Smoker  . Smokeless tobacco: Never Used  Vaping Use  . Vaping Use: Never used  Substance Use Topics  . Alcohol use: Yes    Comment:  1-2 wine or beer nightly  . Drug use: No    Home Medications Prior to Admission medications   Medication Sig Start Date End Date Taking? Authorizing Provider  azithromycin (ZITHROMAX) 250 MG tablet Take 1 tablet (250 mg total) by mouth daily. Take first 2 tablets together, then 1 every day until finished. 08/08/20  Yes Fredia Sorrow, MD  Acetaminophen (TYLENOL ARTHRITIS PAIN PO) Take by mouth. Patient not taking: No sig reported    [provider]  Ascorbic Acid (VITAMIN C) 1000  MG tablet Take 1,000 mg by mouth daily.    [provider]  Calcium Carb-Cholecalciferol (CALCIUM 1000 + D PO) Take by mouth daily.    [provider]  cholecalciferol (VITAMIN D) 1000 units tablet Take 1.5 tablets (1,500 Units total) by mouth daily. 10/11/15   Binnie Rail, MD  predniSONE (DELTASONE) 5 MG tablet Take 0.5 tablets (2.5 mg total) by mouth daily with breakfast. 05/02/20   Rice, Resa Miner, MD  tretinoin (RETIN-A) 0.05 % cream APPLY A PEASIZE AMOUNT ONTO THE FACE NIGHTLY 01/10/20   Binnie Rail, MD    Allergies    Fosamax [alendronate]  Review of Systems   Review of Systems  Constitutional: Negative for chills and fever.  HENT: Negative for congestion, rhinorrhea and sore throat.   Eyes: Negative for visual disturbance.  Respiratory: Positive for chest tightness. Negative for cough, shortness of breath, wheezing and stridor.   Cardiovascular: Positive for chest pain. Negative for leg swelling.  Gastrointestinal: Negative for abdominal  pain, diarrhea, nausea and vomiting.  Genitourinary: Negative for dysuria.  Musculoskeletal: Negative for back pain and neck pain.  Skin: Negative for rash.  Neurological: Negative for dizziness, light-headedness and headaches.  Hematological: Does not bruise/bleed easily.  Psychiatric/Behavioral: Negative for confusion.    Physical Exam Updated Vital Signs BP (!) 142/82 (BP Location: Right Arm)   Pulse 83   Temp 98.6 F (37 C) (Oral)   Resp 13   Ht 1.626 m (5\' 4" )   Wt 57.6 kg   SpO2 99%   BMI 21.80 kg/m   Physical Exam Vitals and nursing note reviewed.  Constitutional:      General: She is not in acute distress.    Appearance: Normal appearance. She is well-developed. She is not ill-appearing or toxic-appearing.  HENT:     Head: Normocephalic and atraumatic.     Mouth/Throat:     Mouth: Mucous membranes are moist.  Eyes:     Extraocular Movements: Extraocular movements intact.     Conjunctiva/sclera: Conjunctivae normal.     Pupils: Pupils are equal, round, and reactive to light.  Cardiovascular:     Rate and Rhythm: Normal rate and regular rhythm.     Heart sounds: No murmur heard.   Pulmonary:     Effort: Pulmonary effort is normal. No respiratory distress.     Breath sounds: Normal breath sounds. No wheezing, rhonchi or rales.  Chest:     Chest wall: No tenderness.  Abdominal:     Palpations: Abdomen is soft.     Tenderness: There is no abdominal tenderness.  Musculoskeletal:        General: No swelling. Normal range of motion.     Cervical back: Normal range of motion and neck supple.  Skin:    General: Skin is warm and dry.     Capillary Refill: Capillary refill takes less than 2 seconds.  Neurological:     General: No focal deficit present.     Mental Status: She is alert and oriented to person, place, and time.     Sensory: No sensory deficit.     Motor: No weakness.     ED Results / Procedures / Treatments   Labs (all labs ordered are listed,  but only abnormal results are displayed) Labs Reviewed  BASIC METABOLIC PANEL - Abnormal; Notable for the following components:      Result Value   Glucose, Bld 107 (*)    All other components within normal limits  CBC  D-DIMER, QUANTITATIVE  TROPONIN I (HIGH SENSITIVITY)  TROPONIN I (HIGH SENSITIVITY)    EKG EKG Interpretation  Date/Time:  Wednesday Aug 08 2020 10:21:12 EDT Ventricular Rate:  82 PR Interval:  140 QRS Duration: 72 QT Interval:  364 QTC Calculation: 425 R Axis:   79 Text Interpretation: Normal sinus rhythm Normal ECG Confirmed by Fredia Sorrow [phone]) on 08/08/2020 10:40:04 AM   Radiology DG Chest 2 View  Result Date: 08/08/2020 CLINICAL DATA:  Right chest pain EXAM: CHEST - 2 VIEW COMPARISON:  09/25/2016 FINDINGS: Cardiomediastinal silhouette and pulmonary vasculature are within normal limits. Right upper lobe airspace opacities suspicious for pneumonia. Lungs otherwise clear. IMPRESSION: Right upper lobe airspace opacity most likely due to pneumonia. Radiographic follow-up to resolution is recommended to exclude lung mass. Electronically Signed   By: Miachel Roux M.D.   On: 08/08/2020 10:53   CT Chest Wo Contrast  Result Date: 08/08/2020 CLINICAL DATA:  Pneumonia, effusion or abscess suspected in a 70 year old female. EXAM: CT CHEST WITHOUT CONTRAST TECHNIQUE: Multidetector CT imaging of the chest was performed following the standard protocol without IV contrast. COMPARISON:  None FINDINGS: Cardiovascular: No significant aortic atherosclerosis on noncontrast imaging. No aortic dilation. Normal heart size. No substantial pericardial effusion. Normal caliber of the central pulmonary vessels. Limited assessment of cardiovascular structures given lack of intravenous contrast. Mediastinum/Nodes: Thoracic inlet structures are normal. Esophagus grossly normal. No axillary lymphadenopathy. No mediastinal adenopathy. Scattered small lymph nodes in the mediastinum. No gross  hilar nodal enlargement on noncontrast evaluation. Lungs/Pleura: Nodular airspace disease in the RIGHT lateral chest along the fissural confluence in the RIGHT upper lobe measuring in total approximately 3.5 x 2.9 cm. Other areas tracking cephalad along the peripheral RIGHT upper lobe. Some subtle ground-glass in the area but the predominant abnormality is airspace disease and nodularity. Small discrete peripheral nodules in this location. Small nodules elsewhere in the chest, for instance on image 89 of series 4 measuring 5 mm. Pleural based nodule in the upper lobe on the LEFT (image 22/4) 7 mm. Biapical pleural and parenchymal scarring. No consolidation. No pleural effusion. Airways are patent. Scarring is evident in the RIGHT middle lobe. Upper Abdomen: Incidental imaging of upper abdominal contents without acute process. Imaged portions the liver, spleen, pancreas, adrenal glands and upper pole of LEFT kidney are unremarkable. Musculoskeletal: Spinal degenerative changes. No acute or destructive bone process. IMPRESSION: RIGHT upper lobe nodular airspace disease at the along the fissural confluence with small adjacent nodules could reflect infectious or inflammatory process. Not in a classic lobar distribution and with irregular appearance that makes neoplasm a differential consideration. Short interval follow-up is suggested to assess for resolution, this could be considered at 8-12 weeks. PET evaluation could also be considered based on appearance particularly if there are no symptoms that would suggest infection. Other small nodules in the chest largest in the RIGHT middle lobe at the periphery. Suggest attention on follow-up with subsequent follow-up as dictated based on follow-up studies. Electronically Signed   By: Zetta Bills M.D.   On: 08/08/2020 14:02    Procedures Procedures   Medications Ordered in ED Medications - No data to display  ED Course  I have reviewed the triage vital signs and  the nursing notes.  Pertinent labs & imaging results that were available during my care of the patient were reviewed by me and considered in my medical decision making (see chart for details).    MDM Rules/Calculators/A&P  Patient's troponins negative.  D-dimer normal.  No concerns about pulmonary embolus.  However regular chest x-ray showed a right upper lobe infiltrate or mass.  CT scan of that area without contrast sort of confirmed concerns for possible neoplastic process.  But will treat as a right upper lobe pneumonia with a Z-Pak close follow-up with her primary care doctor also given referral to pulmonary medicine.  Patient is aware of the concern for possible mass.  Patient not really having labs consistent with a pneumonia.  No fever no leukocytosis.  Electrolytes without any significant abnormality.  No evidence of an acute cardiac abnormality.  Certainly the a right upper lobe pneumonia could be consistent with the patient's pain.  Final Clinical Impression(s) / ED Diagnoses Final diagnoses:  Community acquired pneumonia of right upper lobe of lung    Rx / DC Orders ED Discharge Orders         Ordered    azithromycin (ZITHROMAX) 250 MG tablet  Daily        08/08/20 1523           Fredia Sorrow, MD 08/08/20 1650

## 2020-08-08 NOTE — Telephone Encounter (Signed)
[facility] FYI  Caller states she is experiencing chest pain and now it is underneath her ribs on the right side. Caller states she can feel the pain when she breathes in deeply.Caller states she has an appointment tomorrow at the office but she is concerned.  Advised by Myriam Forehand RN to CALL EMS 911 NOW: * Immediate medical attention is needed. You need to hang up and call 911 (or an ambulance).

## 2020-08-08 NOTE — Telephone Encounter (Signed)
Currently in ED

## 2020-08-08 NOTE — ED Notes (Signed)
CT done

## 2020-08-08 NOTE — Discharge Instructions (Signed)
As we discussed the right upper lobe pneumonia but is concerning for possible mass.  Take the antibiotic as directed make an appointment to follow back up with your primary care doctor.  Also make an appointment to follow-up with pulmonology.

## 2020-08-08 NOTE — Telephone Encounter (Signed)
[facility] advised to go to ED. Patient refused and preferred to be seen in office. Triage nurse stated she is having chest pains.   Please advise.

## 2020-08-08 NOTE — ED Triage Notes (Signed)
She c/o right-sided chest/thoracic area pain which is worse with movement since yesterday. She is in no distress.

## 2020-08-08 NOTE — Telephone Encounter (Signed)
Patient called to report side pain that started overnight  Appointment scheduled for 5/19  Patient states she feels "odd", requesting to speak with nurse Call transferred to [facility] for triage

## 2020-08-09 ENCOUNTER — Ambulatory Visit: Payer: Medicare HMO | Admitting: Internal Medicine

## 2020-08-13 NOTE — Patient Instructions (Addendum)
Medications changes include :  none    Please followup in 1 year    Health Maintenance, Female Adopting a healthy lifestyle and getting preventive care are important in promoting health and wellness. Ask your health care provider about:  The right schedule for you to have regular tests and exams.  Things you can do on your own to prevent diseases and keep yourself healthy. What should I know about diet, weight, and exercise? Eat a healthy diet  Eat a diet that includes plenty of vegetables, fruits, low-fat dairy products, and lean protein.  Do not eat a lot of foods that are high in solid fats, added sugars, or sodium.   Maintain a healthy weight Body mass index (BMI) is used to identify weight problems. It estimates body fat based on height and weight. Your health care provider can help determine your BMI and help you achieve or maintain a healthy weight. Get regular exercise Get regular exercise. This is one of the most important things you can do for your health. Most adults should:  Exercise for at least 150 minutes each week. The exercise should increase your heart rate and make you sweat (moderate-intensity exercise).  Do strengthening exercises at least twice a week. This is in addition to the moderate-intensity exercise.  Spend less time sitting. Even light physical activity can be beneficial. Watch cholesterol and blood lipids Have your blood tested for lipids and cholesterol at 70 years of age, then have this test every 5 years. Have your cholesterol levels checked more often if:  Your lipid or cholesterol levels are high.  You are older than 70 years of age.  You are at high risk for heart disease. What should I know about cancer screening? Depending on your health history and family history, you may need to have cancer screening at various ages. This may include screening for:  Breast cancer.  Cervical cancer.  Colorectal cancer.  Skin cancer.  Lung  cancer. What should I know about heart disease, diabetes, and high blood pressure? Blood pressure and heart disease  High blood pressure causes heart disease and increases the risk of stroke. This is more likely to develop in people who have high blood pressure readings, are of African descent, or are overweight.  Have your blood pressure checked: ? Every 3-5 years if you are 73-80 years of age. ? Every year if you are 43 years old or older. Diabetes Have regular diabetes screenings. This checks your fasting blood sugar level. Have the screening done:  Once every three years after age 68 if you are at a normal weight and have a low risk for diabetes.  More often and at a younger age if you are overweight or have a high risk for diabetes. What should I know about preventing infection? Hepatitis B If you have a higher risk for hepatitis B, you should be screened for this virus. Talk with your health care provider to find out if you are at risk for hepatitis B infection. Hepatitis C Testing is recommended for:  Everyone born from [birth year] through 1965.  Anyone with known risk factors for hepatitis C. Sexually transmitted infections (STIs)  Get screened for STIs, including gonorrhea and chlamydia, if: ? You are sexually active and are younger than 70 years of age. ? You are older than 70 years of age and your health care provider tells you that you are at risk for this type of infection. ? Your sexual activity has changed since  you were last screened, and you are at increased risk for chlamydia or gonorrhea. Ask your health care provider if you are at risk.  Ask your health care provider about whether you are at high risk for HIV. Your health care provider may recommend a prescription medicine to help prevent HIV infection. If you choose to take medicine to prevent HIV, you should first get tested for HIV. You should then be tested every 3 months for as long as you are taking the  medicine. Pregnancy  If you are about to stop having your period (premenopausal) and you may become pregnant, seek counseling before you get pregnant.  Take 400 to 800 micrograms (mcg) of folic acid every day if you become pregnant.  Ask for birth control (contraception) if you want to prevent pregnancy. Osteoporosis and menopause Osteoporosis is a disease in which the bones lose minerals and strength with aging. This can result in bone fractures. If you are 64 years old or older, or if you are at risk for osteoporosis and fractures, ask your health care provider if you should:  Be screened for bone loss.  Take a calcium or vitamin D supplement to lower your risk of fractures.  Be given hormone replacement therapy (HRT) to treat symptoms of menopause. Follow these instructions at home: Lifestyle  Do not use any products that contain nicotine or tobacco, such as cigarettes, e-cigarettes, and chewing tobacco. If you need help quitting, ask your health care provider.  Do not use street drugs.  Do not share needles.  Ask your health care provider for help if you need support or information about quitting drugs. Alcohol use  Do not drink alcohol if: ? Your health care provider tells you not to drink. ? You are pregnant, may be pregnant, or are planning to become pregnant.  If you drink alcohol: ? Limit how much you use to 0-1 drink a day. ? Limit intake if you are breastfeeding.  Be aware of how much alcohol is in your drink. In the [place] one drink equals one 12 oz bottle of beer (355 mL), one 5 oz glass of wine (148 mL), or one 1 oz glass of hard liquor (44 mL). General instructions  Schedule regular health, dental, and eye exams.  Stay current with your vaccines.  Tell your health care provider if: ? You often feel depressed. ? You have ever been abused or do not feel safe at home. Summary  Adopting a healthy lifestyle and getting preventive care are important in  promoting health and wellness.  Follow your health care provider's instructions about healthy diet, exercising, and getting tested or screened for diseases.  Follow your health care provider's instructions on monitoring your cholesterol and blood pressure. This information is not intended to replace advice given to you by your health care provider. Make sure you discuss any questions you have with your health care provider. Document Revised: 03/03/2018 Document Reviewed: 03/03/2018 Elsevier Patient Education  2021 [employer].

## 2020-08-13 NOTE — Progress Notes (Signed)
Subjective:    Patient ID: Linda [name], female    DOB: 06-Apr-1950, 70 y.o.   MRN: [id number]   This visit occurred during the SARS-CoV-2 public health emergency.  Safety protocols were in place, including screening questions prior to the visit, additional usage of staff PPE, and extensive cleaning of exam room while observing appropriate contact time as indicated for disinfecting solutions.    HPI She is here for a physical exam.   She recently had right sided CP - went to ED.  W/u showed abnormal Ct of lungs - RUL airspace disease w/ small adjacent nodules could reflect infectious process or inflammatory process.  Neoplasm is possible.  Short interval f/u in 8-12 weeks.  Other small nodules in chest largest in RML at periphery.  When she takes a deep breath or yawns she feels a little tenderness in the right side of her chest.  It has gotten a lot better.  She denies ever having a cough, shortness of breath or wheezing.  She has completed the Z-Pak.  She has an appt with Pulmonary in June.   She was taken off the prednisone for her PMR and shortly after that her pain recurred.  She has not followed up with rheumatology yet.  Medications and allergies reviewed with patient and updated if appropriate.  Patient Active Problem List   Diagnosis Date Noted  . PMR (polymyalgia rheumatica) ([place]) 02/23/2020  . Acute medial meniscal tear, left, initial encounter 02/16/2019  . Dizziness 10/26/2018  . 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 Upper airway cough syndrome 11/17/2017  . History of colonic polyps 05/22/2014  . Hyperglycemia 09/04/2013  . Allergic rhinitis 08/06/2013  . ANXIETY STATE, UNSPECIFIED 11/29/2009  . Vitamin D deficiency 04/10/2008  . Osteoporosis 10/04/2007    Current Outpatient Medications on File Prior to Visit  Medication Sig Dispense Refill  .  Ascorbic Acid (VITAMIN C) 1000 MG tablet Take 1,000 mg by mouth daily.    . Calcium Carb-Cholecalciferol (CALCIUM 1000 + D PO) Take by mouth daily.    . cholecalciferol (VITAMIN D) 1000 units tablet Take 1.5 tablets (1,500 Units total) by mouth daily. 90 tablet 2  . tretinoin (RETIN-A) 0.05 % cream APPLY A PEASIZE AMOUNT ONTO THE FACE NIGHTLY 45 g 1   No current facility-administered medications on file prior to visit.    Past Medical History:  Diagnosis Date  . Cellulitis of leg, right 08/27/2016  . Hyperlipidemia   . Osteopenia   . Vitamin D deficiency     Past Surgical History:  Procedure Laterality Date  . COLONOSCOPY  03/2011   Dr Brodie;diminuitive polyp  . G 2 P 2    . TONSILLECTOMY      Social History   Socioeconomic History  . Marital status: Married    Spouse name: Not on file  . Number of children: 2  . Years of education: Not on file  . Highest education level: Not on file  Occupational History  . Occupation: banking  Tobacco Use  . Smoking status: Never Smoker  . Smokeless tobacco: Never Used  Vaping Use  . Vaping Use: Never used  Substance and Sexual Activity  . Alcohol use: Yes    Comment:  1-2 wine or beer nightly  . Drug use: No  . Sexual activity: Not on file  Other Topics Concern  . Not on  file  Social History Narrative   Exercise:  Yoga, weights, walking      Two children, grandchildren   Social Determinants of Health   Financial Resource Strain: Not on file  Food Insecurity: Not on file  Transportation Needs: Not on file  Physical Activity: Not on file  Stress: Not on file  Social Connections: Not on file    Family History  Problem Relation Age of Onset  . Melanoma Father   . Hyperlipidemia Mother   . Heart disease Mother        no MI  . Diabetes Paternal Grandmother        vision loss  . Diabetes Maternal Uncle   . Colon cancer Neg Hx   . Stomach cancer Neg Hx   . Esophageal cancer Neg Hx   . Rectal cancer Neg Hx   . Stroke  Neg Hx     Review of Systems  Constitutional: Negative for chills and fever.  Eyes: Negative for visual disturbance.  Respiratory: Positive for chest tightness. Negative for cough, shortness of breath and wheezing.   Cardiovascular: Positive for chest pain (right sided). Negative for palpitations and leg swelling.  Gastrointestinal: Negative for abdominal pain, blood in stool, constipation, diarrhea and nausea.       No gerd  Genitourinary: Negative for dysuria.  Musculoskeletal: Positive for arthralgias (proximal joints) and myalgias (PMR related - proximal mm). Negative for back pain.  Skin: Negative for rash.  Neurological: Negative for light-headedness and headaches.  Psychiatric/Behavioral: Positive for dysphoric mood (mild ) and sleep disturbance. The patient is nervous/anxious (mild).        Objective:   Vitals:   08/14/20 0859  BP: 116/78  Pulse: (!) 59  Temp: 98.1 F (36.7 C)  SpO2: 97%   Filed Weights   08/14/20 0859  Weight: 128 lb (58.1 kg)   Body mass index is 21.97 kg/m.  BP Readings from Last 3 Encounters:  08/14/20 116/78  08/08/20 (!) 142/82  05/29/20 128/79    Wt Readings from Last 3 Encounters:  08/14/20 128 lb (58.1 kg)  08/08/20 127 lb (57.6 kg)  05/29/20 130 lb (59 kg)   Depression screen [facility] 2/9 08/14/2020 08/03/2019 01/27/2017 03/26/2016  Decreased Interest 0 0 0 0  Down, Depressed, Hopeless 1 0 0 0  PHQ - 2 Score 1 0 0 0  Altered sleeping 2 - - -  Tired, decreased energy 1 - - -  Change in appetite 0 - - -  Feeling bad or failure about yourself  0 - - -  Trouble concentrating 1 - - -  Moving slowly or fidgety/restless 0 - - -  Suicidal thoughts 0 - - -  PHQ-9 Score 5 - - -  Difficult doing work/chores Somewhat difficult - - -    GAD 7 : Generalized Anxiety Score 08/14/2020  Nervous, Anxious, on Edge 1  Control/stop worrying 1  Worry too much - different things 1  Trouble relaxing 1  Restless 1  Easily annoyed or irritable 1   Afraid - awful might happen 1  Total GAD 7 Score 7  Anxiety Difficulty Somewhat difficult        Physical Exam Constitutional: She appears well-developed and well-nourished. No distress.  HENT:  Head: Normocephalic and atraumatic.  Right Ear: External ear normal. Normal ear canal and TM Left Ear: External ear normal.  Normal ear canal and TM Mouth/Throat: Oropharynx is clear and moist.  Eyes: Conjunctivae and EOM are normal.  Neck: Neck  supple. No tracheal deviation present. No thyromegaly present.  No carotid bruit  Cardiovascular: Normal rate, regular rhythm and normal heart sounds.   No murmur heard.  No edema. Pulmonary/Chest: Effort normal and breath sounds normal. No respiratory distress. She has no wheezes. She has no rales.  Breast: deferred   Abdominal: Soft. She exhibits no distension. There is no tenderness.  Lymphadenopathy: She has no cervical adenopathy.  Skin: Skin is warm and dry. She is not diaphoretic.  Psychiatric: She has a normal mood and affect. Her behavior is normal.   CT Chest Wo Contrast CLINICAL DATA:  Pneumonia, effusion or abscess suspected in a 70 year old female.  EXAM: CT CHEST WITHOUT CONTRAST  TECHNIQUE: Multidetector CT imaging of the chest was performed following the standard protocol without IV contrast.  COMPARISON:  None  FINDINGS: Cardiovascular: No significant aortic atherosclerosis on noncontrast imaging. No aortic dilation. Normal heart size. No substantial pericardial effusion. Normal caliber of the central pulmonary vessels. Limited assessment of cardiovascular structures given lack of intravenous contrast.  Mediastinum/Nodes: Thoracic inlet structures are normal. Esophagus grossly normal. No axillary lymphadenopathy.  No mediastinal adenopathy. Scattered small lymph nodes in the mediastinum. No gross hilar nodal enlargement on noncontrast evaluation.  Lungs/Pleura: Nodular airspace disease in the RIGHT lateral  chest along the fissural confluence in the RIGHT upper lobe measuring in total approximately 3.5 x 2.9 cm. Other areas tracking cephalad along the peripheral RIGHT upper lobe. Some subtle ground-glass in the area but the predominant abnormality is airspace disease and nodularity. Small discrete peripheral nodules in this location.  Small nodules elsewhere in the chest, for instance on image 89 of series 4 measuring 5 mm.  Pleural based nodule in the upper lobe on the LEFT (image 22/4) 7 mm. Biapical pleural and parenchymal scarring. No consolidation. No pleural effusion. Airways are patent. Scarring is evident in the RIGHT middle lobe.  Upper Abdomen: Incidental imaging of upper abdominal contents without acute process. Imaged portions the liver, spleen, pancreas, adrenal glands and upper pole of LEFT kidney are unremarkable.  Musculoskeletal: Spinal degenerative changes. No acute or destructive bone process.  IMPRESSION: RIGHT upper lobe nodular airspace disease at the along the fissural confluence with small adjacent nodules could reflect infectious or inflammatory process. Not in a classic lobar distribution and with irregular appearance that makes neoplasm a differential consideration. Short interval follow-up is suggested to assess for resolution, this could be considered at 8-12 weeks.  PET evaluation could also be considered based on appearance particularly if there are no symptoms that would suggest infection.  Other small nodules in the chest largest in the RIGHT middle lobe at the periphery. Suggest attention on follow-up with subsequent follow-up as dictated based on follow-up studies.  Electronically Signed   By: Zetta Bills M.D.   On: 08/08/2020 14:02 DG Chest 2 View CLINICAL DATA:  Right chest pain  EXAM: CHEST - 2 VIEW  COMPARISON:  09/25/2016  FINDINGS: Cardiomediastinal silhouette and pulmonary vasculature are within normal limits.  Right upper  lobe airspace opacities suspicious for pneumonia. Lungs otherwise clear.  IMPRESSION: Right upper lobe airspace opacity most likely due to pneumonia. Radiographic follow-up to resolution is recommended to exclude lung mass.  Electronically Signed   By: Miachel Roux M.D.   On: 08/08/2020 10:53       Assessment & Plan:   Physical exam: Screening blood work    ordered Immunizations  Discussed shingrix Colonoscopy  Up to date  Mammogram  Up to date  41  Up to date  Dexa  Up to date  Eye exams  Up to date  Exercise  Regular - goes to [facility] Weight  Normal Substance abuse    none Sees derm   Depression screening using PHQ-9 showed possible mild depression with affecting her ability to do things somewhat difficult..  She denies depression and does not feel this is an issue.  Some of her positive questions were related to difficulty falling asleep and feeling tired.  At times she states some mild depression, but overall she denies depression and does not feel that she needs anything to help with this.  We will monitor.   See Problem List for Assessment and Plan of chronic medical problems.

## 2020-08-14 ENCOUNTER — Other Ambulatory Visit: Payer: Self-pay

## 2020-08-14 ENCOUNTER — Encounter: Payer: Self-pay | Admitting: Internal Medicine

## 2020-08-14 ENCOUNTER — Ambulatory Visit (INDEPENDENT_AMBULATORY_CARE_PROVIDER_SITE_OTHER): Payer: Medicare HMO | Admitting: Internal Medicine

## 2020-08-14 VITALS — BP 116/78 | HR 59 | Temp 98.1°F | Ht 64.0 in | Wt 128.0 lb

## 2020-08-14 DIAGNOSIS — E559 Vitamin D deficiency, unspecified: Secondary | ICD-10-CM | POA: Diagnosis not present

## 2020-08-14 DIAGNOSIS — F411 Generalized anxiety disorder: Secondary | ICD-10-CM

## 2020-08-14 DIAGNOSIS — M353 Polymyalgia rheumatica: Secondary | ICD-10-CM

## 2020-08-14 DIAGNOSIS — Z0000 Encounter for general adult medical examination without abnormal findings: Secondary | ICD-10-CM

## 2020-08-14 DIAGNOSIS — M810 Age-related osteoporosis without current pathological fracture: Secondary | ICD-10-CM

## 2020-08-14 DIAGNOSIS — R739 Hyperglycemia, unspecified: Secondary | ICD-10-CM | POA: Diagnosis not present

## 2020-08-14 NOTE — Assessment & Plan Note (Signed)
Stopped prednisone - symptoms have recurred - having proximal arm and leg mm pain and joint pain.  Advised to go back and see rheumatology again

## 2020-08-14 NOTE — Assessment & Plan Note (Signed)
Chronic DEXA up-to-date-due next year Stressed continuing regular exercise Continue calcium and vitamin D daily

## 2020-08-14 NOTE — Assessment & Plan Note (Signed)
Chronic He does have generalized anxiety disorder that is relatively mild GAD-7 score 7 She feels she is able to manage her anxiety and does not feel the need for any medication She knows that exercise is the biggest thing that helps her and she does that on a regular basis No treatment needed at this time

## 2020-08-14 NOTE — Assessment & Plan Note (Signed)
Chronic Continue taking vitamin D daily

## 2020-08-14 NOTE — Assessment & Plan Note (Signed)
Chronic Sugars have been well controlled Continue regular exercise and healthy diet

## 2020-08-24 NOTE — Progress Notes (Signed)
I, Wendy Poet, LAT, ATC, am serving as scribe for Dr. Lynne Leader.  Linda [name] is a 70 y.o. female who presents to [place] [facility] today for bilat shoulder and elbow pain. Pt was previously seen by Dr. Tamala Julian for back and R foot pain.  She was also recently diagnosed w/ polyrheumatica and was placed on prednisone.  Pt reports bilat shoulder pain x 10 year . Pt locates shoulder pain to her B post shoulder .  Neck pain: yes Radiates: No Mechanical symptoms: No UE Numbness/tingling: No Aggravates: B shoulder AROM above 90 deg; overhead AROM; B shoulder functional IR Treatments tried: HEP; Tylenol  Pt also c/o bilat elbow pain x approximately 3 months.  Pt locates elbow pain to her B lateral epidcondyles and olecranon process. Neck pain: yes Radiates: No Aggravates: Gripping/lifting activitiets Treatments tried: Tylenol  Pt also c/o chronic B knee pain.  She states that Dr. Tamala Julian indicated that she has a torn meniscus per [place].  Pertinent review of systems: No fevers or chills  Relevant historical information: Osteoporosis.  Polymyalgia rheumatica previously managed with steroids now off of steroids after tapering down.  Patient has a follow-up appointment with rheumatology next week.   Exam:  BP 102/62 (BP Location: Right Arm, Patient Position: Sitting, Cuff Size: Normal)   Pulse 83   Ht 5\' 4"  (1.626 m)   Wt 130 lb 3.2 oz (59.1 kg)   SpO2 98%   BMI 22.35 kg/m  General: Well Developed, well nourished, and in no acute distress.   MSK: C-spine normal. Nontender midline. Tender palpation bilateral trapezius and paraspinal musculature.  Shoulders bilaterally normal-appearing nontender normal shoulder motion pain with abduction. Strength 4/5 abduction.  Normal external and internal rotation strength. Positive Hawkins and Neer's test positive empty can test. Negative Yergason's and speeds test.  Right elbow normal appearing.  Tender palpation  lateral epicondyle.  Normal elbow strength and motion. Pain with resisted wrist and finger extension.  Left elbow normal-appearing.  Tender palpation lateral epicondyle. Normal elbow strength and motion. Pain with resisted wrist and finger extension.  Knees bilaterally normal appearing. Normal motion with crepitation.    Lab and Radiology Results  Diagnostic Limited MSK Ultrasound of: Right shoulder Biceps tendon intact.  Small amount of hypoechoic fluid tracks within tendon sheath. Subscapularis tendon is intact. Supraspinatus tendon is intact. Mild subacromial bursitis. Infraspinatus tendon is intact. AC joint mild effusion Impression: Subacromial bursitis.  Small amount of hypoechoic fluid tracks within biceps tendon sheath  Diagnostic Limited MSK Ultrasound of: Left shoulder This of tendon is intact and normal-appearing Subscapularis tendon is intact. Supraspinatus tendon is intact. Small amount of subacromial bursitis present. Infraspinatus tendon is intact. Impression: Subacromial bursitis  X-ray images bilateral shoulders obtained today personally and independently interpreted  Right shoulder: Irregularity humeral head and rotator cuff insertion site without definitive avulsion..  No severe DJD.  No acute fractures.  Left shoulder: No acute fractures.  Await formal radiology review   Assessment and Plan: 70 y.o. female with bilateral shoulder pain.  Pain predominantly due to subacromial bursitis/rotator cuff tendinopathy.  Additionally patient has periscapular dysfunction.  She should do well with physical therapy.  Plan for physical therapy referral and recheck in about 6 weeks.  Additionally she has evidence of lateral epicondylitis bilaterally again this should improve with physical therapy and home exercise program.  Knee pain was a bit on the back burner today as her focus was more on her shoulders and elbows but physical  therapy should help with this issue as  well.  We will focus more on knees no follow-up is still hurting.  Recommend Voltaren gel for these issues.  She does have a history of polymyalgia rheumatica and has had worsening shoulder girdle pain several months after stopping prednisone.  We will go ahead and recheck sed rate and CRP.  She has a follow-up appointment with rheumatology next week.  These labs should be helpful.   PDMP not reviewed this encounter. Orders Placed This Encounter  Procedures  . [place] LIMITED JOINT SPACE STRUCTURES UP BILAT(NO LINKED CHARGES)    Order Specific Question:   Reason for Exam (SYMPTOM  OR DIAGNOSIS REQUIRED)    Answer:   B shoulder pain    Order Specific Question:   Preferred imaging location?    Answer:   [place]  . DG Shoulder Right    Standing Status:   Future    Number of Occurrences:   1    Standing Expiration Date:   08/27/2021    Order Specific Question:   Reason for Exam (SYMPTOM  OR DIAGNOSIS REQUIRED)    Answer:   eval shoulder pain    Order Specific Question:   Preferred imaging location?    Answer:   [facility]  . DG Shoulder Left    Standing Status:   Future    Number of Occurrences:   1    Standing Expiration Date:   08/27/2021    Order Specific Question:   Reason for Exam (SYMPTOM  OR DIAGNOSIS REQUIRED)    Answer:   eval shoudler pain    Order Specific Question:   Preferred imaging location?    Answer:   [facility]  . Sedimentation rate    Standing Status:   Future    Number of Occurrences:   1    Standing Expiration Date:   08/27/2021  . C-reactive protein  . Ambulatory referral to Physical Therapy    Referral Priority:   Routine    Referral Type:   Physical Medicine    Referral Reason:   Specialty Services Required    Requested Specialty:   Physical Therapy   No orders of the defined types were placed in this encounter.    Discussed warning signs or symptoms. Please see discharge instructions. Patient expresses  understanding.   The above documentation has been reviewed and is accurate and complete Lynne Leader, M.D.

## 2020-08-27 ENCOUNTER — Other Ambulatory Visit: Payer: Self-pay

## 2020-08-27 ENCOUNTER — Ambulatory Visit: Payer: Self-pay

## 2020-08-27 ENCOUNTER — Ambulatory Visit (INDEPENDENT_AMBULATORY_CARE_PROVIDER_SITE_OTHER): Payer: Medicare HMO

## 2020-08-27 ENCOUNTER — Encounter: Payer: Self-pay | Admitting: Family Medicine

## 2020-08-27 ENCOUNTER — Ambulatory Visit (INDEPENDENT_AMBULATORY_CARE_PROVIDER_SITE_OTHER): Payer: Medicare HMO | Admitting: Family Medicine

## 2020-08-27 VITALS — BP 102/62 | HR 83 | Ht 64.0 in | Wt 130.2 lb

## 2020-08-27 DIAGNOSIS — M25522 Pain in left elbow: Secondary | ICD-10-CM

## 2020-08-27 DIAGNOSIS — M25561 Pain in right knee: Secondary | ICD-10-CM | POA: Diagnosis not present

## 2020-08-27 DIAGNOSIS — M25562 Pain in left knee: Secondary | ICD-10-CM

## 2020-08-27 DIAGNOSIS — M353 Polymyalgia rheumatica: Secondary | ICD-10-CM | POA: Diagnosis not present

## 2020-08-27 DIAGNOSIS — M25512 Pain in left shoulder: Secondary | ICD-10-CM

## 2020-08-27 DIAGNOSIS — M25521 Pain in right elbow: Secondary | ICD-10-CM | POA: Diagnosis not present

## 2020-08-27 DIAGNOSIS — M25511 Pain in right shoulder: Secondary | ICD-10-CM

## 2020-08-27 DIAGNOSIS — M19012 Primary osteoarthritis, left shoulder: Secondary | ICD-10-CM | POA: Diagnosis not present

## 2020-08-27 DIAGNOSIS — G8929 Other chronic pain: Secondary | ICD-10-CM

## 2020-08-27 DIAGNOSIS — M19011 Primary osteoarthritis, right shoulder: Secondary | ICD-10-CM | POA: Diagnosis not present

## 2020-08-27 LAB — SEDIMENTATION RATE: Sed Rate: 14 mm/hr (ref 0–30)

## 2020-08-27 NOTE — Patient Instructions (Signed)
Thank you for coming in today.  I've referred you to Physical Therapy.  Let us know if you don't hear from them in one week.  Please get labs today before you leave  Please get an Xray today before you leave   Please use Voltaren gel (Generic Diclofenac Gel) up to 4x daily for pain as needed.  This is available over-the-counter as both the name brand Voltaren gel and the generic diclofenac gel.  Recheck with me in 6 weeks.   Let me know sooner if this is not working. We can do more.

## 2020-08-28 ENCOUNTER — Encounter: Payer: Self-pay | Admitting: Family Medicine

## 2020-08-28 LAB — C-REACTIVE PROTEIN: CRP: <1 mg/dL (ref 0.5–20.0)

## 2020-08-28 NOTE — Progress Notes (Signed)
Left shoulder x-ray shows some mild arthritis

## 2020-08-28 NOTE — Progress Notes (Signed)
Sedimentation rate and C-reactive protein are both in the normal range indicating that polymyalgia rheumatica is probably not a factor

## 2020-08-28 NOTE — Progress Notes (Signed)
Right shoulder x-ray shows some mild arthritis

## 2020-08-29 ENCOUNTER — Ambulatory Visit: Payer: Medicare HMO | Admitting: Physical Therapy

## 2020-08-29 ENCOUNTER — Other Ambulatory Visit: Payer: Self-pay

## 2020-08-29 ENCOUNTER — Encounter: Payer: Self-pay | Admitting: Physical Therapy

## 2020-08-29 DIAGNOSIS — M25512 Pain in left shoulder: Secondary | ICD-10-CM | POA: Diagnosis not present

## 2020-08-29 DIAGNOSIS — G8929 Other chronic pain: Secondary | ICD-10-CM

## 2020-08-29 DIAGNOSIS — M25562 Pain in left knee: Secondary | ICD-10-CM | POA: Diagnosis not present

## 2020-08-29 DIAGNOSIS — M542 Cervicalgia: Secondary | ICD-10-CM

## 2020-08-29 DIAGNOSIS — M25561 Pain in right knee: Secondary | ICD-10-CM

## 2020-08-29 DIAGNOSIS — M25521 Pain in right elbow: Secondary | ICD-10-CM

## 2020-08-29 DIAGNOSIS — M25511 Pain in right shoulder: Secondary | ICD-10-CM | POA: Diagnosis not present

## 2020-08-29 NOTE — Patient Instructions (Signed)
Access Code: [email or web address] URL: [email or web address] Date: 08/29/2020 Prepared by: Lyndee Hensen  Exercises Supine Shoulder Flexion Extension AAROM with Dowel - 2 x daily - 1 sets - 10 reps Single Arm Shoulder Post Capsule Stretch - 2 x daily - 3 reps - 30 hold Seated Wrist Flexion Stretch - 2 x daily - 3 reps - 30 hold Cat Cow - 2 x daily - 2 sets - 10 reps Prone Quadriceps Stretch with Strap - 2 x daily - 3 reps - 30 hold

## 2020-09-03 ENCOUNTER — Encounter: Payer: Self-pay | Admitting: Internal Medicine

## 2020-09-03 ENCOUNTER — Other Ambulatory Visit: Payer: Self-pay

## 2020-09-03 ENCOUNTER — Encounter: Payer: Medicare HMO | Admitting: Physical Therapy

## 2020-09-03 ENCOUNTER — Ambulatory Visit (INDEPENDENT_AMBULATORY_CARE_PROVIDER_SITE_OTHER): Payer: Medicare HMO | Admitting: Internal Medicine

## 2020-09-03 ENCOUNTER — Encounter: Payer: Self-pay | Admitting: Physical Therapy

## 2020-09-03 ENCOUNTER — Ambulatory Visit (INDEPENDENT_AMBULATORY_CARE_PROVIDER_SITE_OTHER): Payer: Medicare HMO

## 2020-09-03 ENCOUNTER — Ambulatory Visit: Payer: Medicare HMO | Admitting: Physical Therapy

## 2020-09-03 VITALS — BP 114/72 | HR 81 | Temp 98.7°F | Ht 64.0 in | Wt 130.6 lb

## 2020-09-03 DIAGNOSIS — M25562 Pain in left knee: Secondary | ICD-10-CM

## 2020-09-03 DIAGNOSIS — J189 Pneumonia, unspecified organism: Secondary | ICD-10-CM | POA: Diagnosis not present

## 2020-09-03 DIAGNOSIS — M25521 Pain in right elbow: Secondary | ICD-10-CM | POA: Diagnosis not present

## 2020-09-03 DIAGNOSIS — M25561 Pain in right knee: Secondary | ICD-10-CM | POA: Diagnosis not present

## 2020-09-03 DIAGNOSIS — M25512 Pain in left shoulder: Secondary | ICD-10-CM

## 2020-09-03 DIAGNOSIS — R9389 Abnormal findings on diagnostic imaging of other specified body structures: Secondary | ICD-10-CM

## 2020-09-03 DIAGNOSIS — M25511 Pain in right shoulder: Secondary | ICD-10-CM | POA: Diagnosis not present

## 2020-09-03 DIAGNOSIS — R918 Other nonspecific abnormal finding of lung field: Secondary | ICD-10-CM | POA: Diagnosis not present

## 2020-09-03 DIAGNOSIS — I720 Aneurysm of carotid artery: Secondary | ICD-10-CM | POA: Insufficient documentation

## 2020-09-03 DIAGNOSIS — G8929 Other chronic pain: Secondary | ICD-10-CM | POA: Diagnosis not present

## 2020-09-03 NOTE — Progress Notes (Signed)
Linda [name]    [id number]    [birth year]  Primary Care Physician:Burns, Claudina Lick, MD Date of Appointment: 09/03/2020 New Patient Evaluation, Self Referred  Chief complaint:   Chief Complaint  Patient presents with   Consult    No shortness of breath since completing antibiotics from ER visit.       HPI:  Linda [name] is a 70 y.o. woman who presents for new patient evaluation for pneumonia.    In May 2022 had right sided chest pain and tightness and it felt like gas. Took gas pains and didn't feel better. She was given azithromycin which she completed and feels much better. Her Chest xray and CT scan were abnormal ad she was told to follow up with [place] regarding these findings.   Prior to this had pneumonia once in 2019 when she saw Dr. [name].  No childhood respiratory disease Never been hospitalized for breathing.   Recent 4 months of prednisone for possible PMR. Recent inflammatory markers reviewed and not elevated.   She tries to exercise but mobility is limited by joint pains. No dyspnea that prevents ADLS.  No fevers, chills, night sweats or weight loss. No cough.    Social History:  Occupation: retired, previously a Customer service manager.  Exposures: lives at home with husband no pets Smoking history: never smoker.   Social History   Occupational History   Occupation: Science writer  Tobacco Use   Smoking status: Never   Smokeless tobacco: Never  Vaping Use   Vaping Use: Never used  Substance and Sexual Activity   Alcohol use: Yes    Comment:  1-2 wine or beer nightly   Drug use: No   Sexual activity: Not on file    Relevant family history:  Family History  Problem Relation Age of Onset   Melanoma Father    Hyperlipidemia Mother    Heart disease Mother        no MI   Diabetes Paternal Grandmother        vision loss   Diabetes Maternal Uncle    Colon cancer Neg Hx    Stomach cancer Neg Hx    Esophageal cancer Neg Hx    Rectal cancer Neg Hx    Stroke  Neg Hx     Past Medical History:  Diagnosis Date   Cellulitis of leg, right 08/27/2016   Hyperlipidemia    Osteopenia    Pneumonia    Vitamin D deficiency     Past Surgical History:  Procedure Laterality Date   COLONOSCOPY  03/2011   Dr Brodie;diminuitive polyp   G 2 P 2     TONSILLECTOMY      Physical Exam: Blood pressure 114/72, pulse 81, temperature 98.7 F (37.1 C), temperature source Temporal, height 5\' 4"  (1.626 m), weight 130 lb 9.6 oz (59.2 kg), SpO2 99 %. Gen:      No acute distress ENT:  no nasal polyps, mucus membranes moist Lungs:    No increased respiratory effort, symmetric chest wall excursion, clear to auscultation bilaterally, no wheezes or crackles CV:         Regular rate and rhythm; no murmurs, rubs, or gallops.  No pedal edema MSK: no acute synovitis of DIP or PIP joints, no mechanics hands.  Skin:      Warm and dry; no rashes Neuro: normal speech, no focal facial asymmetry Psych: alert and oriented x3, normal mood and affect  Data Reviewed/Medical Decision Making:  Independent interpretation of tests: Imaging:  Review of patient's chest xray May 2022  images revealed right sided opacity, CT Chest shows peripheral RUL consolidation consistent with pneumonia . The patient's images have been independently reviewed by me.    PFTs:   Labs:  Lab Results  Component Value Date   WBC 6.4 08/08/2020   HGB 12.7 08/08/2020   HCT 37.3 08/08/2020   MCV 88.4 08/08/2020   PLT 214 08/08/2020   Lab Results  Component Value Date   NA 137 08/08/2020   K 3.9 08/08/2020   CL 101 08/08/2020   CO2 27 08/08/2020     Immunization status:  Immunization History  Administered Date(s) Administered   Fluad Quad(high Dose 65+) 11/18/2018   Influenza, High Dose Seasonal PF 04/30/2016, 12/06/2016, 12/15/2017, 12/29/2019   Influenza,inj,Quad PF,6+ Mos 01/19/2015   Influenza-Unspecified 12/22/2013, 12/06/2016, 11/17/2019   PFIZER(Purple Top)SARS-COV-2 Vaccination  04/12/2019, 05/02/2019, 12/29/2019   Pneumococcal Conjugate-13 10/17/2015   Pneumococcal Polysaccharide-23 01/27/2017   Td 11/22/2004   Tdap 10/03/2015   Zoster, Live 10/03/2015     I reviewed prior external note(s) from ED visit  I reviewed the result(s) of the labs and imaging as noted above.   I have ordered chest xray    Assessment:  Community Acquired Pneumonia Abnormal Chest Xray  Plan/Recommendations:  Her symptoms of pneumonia including chest pain and tightness have resolved I ordered a chest xray today which shows persistent RUL opacity. I suspect this is pneumonia that is taking some time to resolve. Will get a repeat CT Chest in 2 weeks (6 weeks from initial.) Clinically her symptoms of chest pain have resolved. I will see her back after this CT scan. No additional abx at this time. Concern for malignancy is low based on patient presentation and risk factors.   We discussed disease management and progression at length today.    Return in about 2 weeks (around 09/17/2020).   Lenice Llamas, MD Pulmonary and [place]

## 2020-09-03 NOTE — Patient Instructions (Signed)
The patient should have follow up scheduled with myself in 2 weeks.   Prior to next visit patient should have: CT Chest, in 2 weeks. We will call you to schedule this.

## 2020-09-03 NOTE — Therapy (Signed)
[facility] [address] [place], [place], [address] Phone: [phone]   Fax:  [phone]  Physical Therapy Evaluation  Patient Details  Name: Linda [name] MRN: [id number] Date of Birth: March 06, 1951 Referring Provider (PT): Lynne Leader   Encounter Date: 08/29/2020   PT End of Session - 09/03/20 1025     Visit Number 1    Number of Visits 12    Date for PT Re-Evaluation 10/10/20    Authorization Type [employer]    PT Start Time 1330    PT Stop Time 1410    PT Time Calculation (min) 40 min    Activity Tolerance Patient tolerated treatment well    Behavior During Therapy [facility] for tasks assessed/performed             Past Medical History:  Diagnosis Date   Cellulitis of leg, right 08/27/2016   Hyperlipidemia    Osteopenia    Pneumonia    Vitamin D deficiency     Past Surgical History:  Procedure Laterality Date   COLONOSCOPY  03/2011   Dr Brodie;diminuitive polyp   G 2 P 2     TONSILLECTOMY      There were no vitals filed for this visit.    Subjective Assessment - 09/03/20 1024     Subjective Pt states ongoing bil shoulder pain for years. She reports previous frozen shoulder years ago, and has increased pain with elevation, reaching, exercise, and dressing. She is still able to be very active, likes zumba, barre, yoga. Of note, she has had polymyalgia rheumatica in the last 1-2 years. Recent labs are now WNL.  She does state pain in multiple joints, bil elbows,   bil knee pain, meniscus tears, and some pain in neck and back as well. Main focus will be shoulder and elbow pain.    Pertinent History polymyalgia rheumatica, OP.    Limitations Lifting;House hold activities    Patient Stated Goals Decreased pain in shoulders , elbows,    Currently in Pain? Yes    Pain Score 8     Pain Location Shoulder    Pain Orientation Right;Left    Pain Descriptors / Indicators Aching    Pain Type Chronic pain    Pain Onset More than  a month ago    Pain Frequency Intermittent    Aggravating Factors  reaching, lifting, dressing, exercise    Pain Score 4    Pain Location Elbow    Pain Orientation Right;Left    Pain Descriptors / Indicators Aching    Pain Type Chronic pain    Pain Onset More than a month ago    Pain Frequency Intermittent    Aggravating Factors  increased activity, gripping    Pain Score 6    Pain Location Knee    Pain Orientation Left;Right    Pain Descriptors / Indicators Aching    Pain Type Acute pain    Pain Onset More than a month ago    Pain Frequency Intermittent    Effect of Pain on Daily Activities difficulty with squats, bending, stairs.                [facility] PT Assessment - 09/03/20 0001       Assessment   Medical Diagnosis Bil shoulder pain, elbow pain, knee pain    Referring Provider (PT) Lynne Leader    Hand Dominance Right    Prior Therapy for back years ago      Precautions  Precautions None      Restrictions   Weight Bearing Restrictions No      Balance Screen   Has the patient fallen in the past 6 months No      Prior Function   Level of Independence Independent      Cognition   Overall Cognitive Status Within Functional Limits for tasks assessed      AROM   Overall AROM Comments Knee ROM: WNL bil;    Right Shoulder Flexion 125 Degrees    Right Shoulder ABduction 95 Degrees    Right Shoulder Internal Rotation --   wfl in supine/at side   Right Shoulder External Rotation --   wfl   Left Shoulder Flexion 125 Degrees    Left Shoulder ABduction 95 Degrees    Left Shoulder Internal Rotation --   Wfl in supine/ at side   Left Shoulder External Rotation --   wfl     PROM   Right Shoulder Flexion 155 Degrees    Right Shoulder ABduction 165 Degrees    Left Shoulder Flexion 155 Degrees    Left Shoulder ABduction 165 Degrees      Strength   Overall Strength Comments Shoulders: 4/5 gross, Elbow: 4/5 , knees 4/5 ;      Palpation   Palpation comment Tightness  in bil Quads; Hypomobile t-spine, limited full end range shoulder ROM for flex, abd, IR behind back,                        Objective measurements completed on examination: See above findings.       [facility] PT Treatment/Exercise - 09/03/20 0001       Exercises   Exercises Shoulder;Knee/Hip      Knee/Hip Exercises: Stretches   Quad Stretch 3 reps;30 seconds    Quad Stretch Limitations prone with strap      Shoulder Exercises: Supine   Flexion 15 reps;AAROM      Shoulder Exercises: Stretch   Other Shoulder Stretches Cat/Cow for thoracic mobility x 15;    Other Shoulder Stretches Posterior shoulder stretch 30 sec x 2 bil;  Wrist flexion stretch 30 sec x 2 bil;                    PT Education - 09/03/20 1025     Education Details PT POC, Exam findings, HEP, Discussion of multiple joints effected and areas to focus on.    Person(s) Educated Patient    Methods Explanation;Demonstration;Tactile cues;Verbal cues;Handout    Comprehension Verbalized understanding;Returned demonstration;Need further instruction;Tactile cues required;Verbal cues required              PT Short Term Goals - 09/03/20 1034       PT SHORT TERM GOAL #1   Title Pt to be independent with initial HEP    Time 2    Period Weeks    Status New    Target Date 09/12/20               PT Long Term Goals - 09/03/20 1035       PT LONG TERM GOAL #1   Title Pt to be independent with final HEP    Time 6    Period Weeks    Status New    Target Date 10/10/20      PT LONG TERM GOAL #2   Title Pt to report decreased pain in bil shoulders to 0-1/10 with elevation , reaching,  and lifting activities.    Time 6    Period Weeks    Status New    Target Date 10/10/20      PT LONG TERM GOAL #3   Title Pt to demo improved shoulder ROM to be WNL for elevation, to improve ability for ADLs and IADLs.    Time 6    Period Weeks    Status New    Target Date 10/10/20      PT  LONG TERM GOAL #4   Title Pt to demo improved strength of bil knees and hips to at least 4+/5 to improve stability, stair ability, and ability for exercise.    Time 6    Period Weeks    Status New    Target Date 10/10/20                    Plan - 09/03/20 1026     Clinical Impression Statement Pt presents with primary complaint of increased pain in bilateral shoulders. She has had ongoing pain for years. She has stiffness in thoracic spine, as well as limited ROM for full elevation of bil shoulders. She has mild weakness in shoulders and scapular muscles as well. .Pt also with pain in bil knees, with baseline meniscal pathology, and pain in bil elbows R>L. Main focus will be shoulders at this time. Pt with decreased ability for full functional activities, due to pain and deficits, and will beneift from skilled PT to improve.    Personal Factors and Comorbidities Time since onset of injury/illness/exacerbation;Comorbidity 1    Comorbidities OP,    Examination-Activity Limitations Reach Overhead;Bathing;Carry;Stand;Lift;Dressing    Examination-Participation Restrictions Meal Prep;Cleaning;Community Activity;Yard Work;Laundry;Shop    Stability/Clinical Decision Making Evolving/Moderate complexity    Clinical Decision Making Moderate    Rehab Potential Good    PT Frequency 2x / week    PT Duration 6 weeks    PT Treatment/Interventions ADLs/Self Care Home Management;Canalith Repostioning;Electrical Stimulation;Gait training;Ultrasound;Traction;Moist Heat;Iontophoresis 4mg /ml Dexamethasone;Stair training;Functional mobility training;Therapeutic activities;Therapeutic exercise;Balance training;Neuromuscular re-education;Manual techniques;Patient/family education;Passive range of motion;Dry needling;Joint Manipulations;Spinal Manipulations;Vasopneumatic Device;Taping;Cryotherapy;Orthotic Fit/Training;DME Instruction    PT Home Exercise Plan [id number]    Consulted and Agree with Plan of Care  Patient             Patient will benefit from skilled therapeutic intervention in order to improve the following deficits and impairments:  Decreased range of motion, Pain, Impaired UE functional use, Increased muscle spasms, Decreased activity tolerance, Improper body mechanics, Impaired flexibility, Hypomobility, Decreased mobility, Decreased strength  Visit Diagnosis: Chronic left shoulder pain  Chronic right shoulder pain  Pain in right elbow  Chronic pain of left knee  Chronic pain of right knee  Cervicalgia     Problem List Patient Active Problem List   Diagnosis Date Noted   Aneurysm artery, 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   PMR (polymyalgia rheumatica) (HCC) 02/23/2020   Acute medial meniscal tear, left, initial encounter 02/16/2019   Dizziness 10/26/2018   Loss of transverse plantar arch 10/19/2018   Neuroma of foot 10/19/2018   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 [place], [place], [address] Phone: [phone]  Fax:  [phone]  Name: Linda [name] MRN: [id number] Date of Birth: 1950/05/13

## 2020-09-04 ENCOUNTER — Encounter: Payer: Self-pay | Admitting: Internal Medicine

## 2020-09-04 ENCOUNTER — Ambulatory Visit: Payer: Medicare HMO | Admitting: Internal Medicine

## 2020-09-04 VITALS — BP 130/84 | HR 80 | Ht 64.0 in | Wt 130.2 lb

## 2020-09-04 DIAGNOSIS — R9389 Abnormal findings on diagnostic imaging of other specified body structures: Secondary | ICD-10-CM | POA: Diagnosis not present

## 2020-09-04 DIAGNOSIS — M353 Polymyalgia rheumatica: Secondary | ICD-10-CM

## 2020-09-04 DIAGNOSIS — M810 Age-related osteoporosis without current pathological fracture: Secondary | ICD-10-CM | POA: Diagnosis not present

## 2020-09-04 NOTE — Progress Notes (Signed)
Office Visit Note  Patient: Linda [name]             Date of Birth: [birth year]           MRN: [id number]             PCP: Binnie Rail, MD Referring: Binnie Rail, MD Visit Date: 09/04/2020   Subjective:  Follow-up (Patient complains of continued generalized joint and muscle pain. Patient recently went to ED and was diagnosed with pneumonia, then seen by pulmonology yesterday. Patient was seen by Dr. Georgina Snell, sports med, on 08/27/2020- sed reate and CRP drawn, xrays and ultrasound obtained. )   History of Present Illness: Linda [name] is a 70 y.o. female here for follow up for PMR. She tapered off her prednisone as planned so discontinued the last 2.5 mg daily last month and does feel her symptoms are overall worse off the medicine. She notices increased joint pain and stiffness in bilateral shoulders, hips, elbows, and knees.Especially worst trying to get up and start moving from resting position. She developed chest pain and went to the ED in May labs were okay but chest CT identified a right upper lung consolidation with local nodular changes possible pneumonia but could not exclude malignancy. She has followed up with pulmonology clinic and has repeat imaging scheduled for later this month on 6/27. She saw Dr. Georgina Snell with sports medicine for her shoulder pains on 6/6 and bilateral xrays showed mild OA and bilateral ultrasound showed subacromial bursitis without tendon tears visualized. She started seeing physical therapy and feels it is helpful.    Review of Systems  Constitutional:  Positive for fatigue.  HENT:  Negative for mouth sores, mouth dryness and nose dryness.   Eyes:  Negative for pain, itching, visual disturbance and dryness.  Respiratory:  Negative for cough, hemoptysis, shortness of breath and difficulty breathing.   Cardiovascular:  Negative for chest pain, palpitations and swelling in legs/feet.  Gastrointestinal:  Negative for abdominal pain, blood in stool,  constipation and diarrhea.  Endocrine: Negative for increased urination.  Genitourinary:  Negative for painful urination.  Musculoskeletal:  Positive for joint pain, joint pain, myalgias, muscle weakness, morning stiffness, muscle tenderness and myalgias. Negative for joint swelling.  Skin:  Negative for color change, rash and redness.  Allergic/Immunologic: Negative for susceptible to infections.  Neurological:  Negative for dizziness, numbness, headaches, memory loss and weakness.  Hematological:  Negative for swollen glands.  Psychiatric/Behavioral:  Positive for sleep disturbance. Negative for confusion.    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   SI (sacroiliac) joint dysfunction 12/16/2017   Upper airway cough syndrome 11/17/2017   History of colonic polyps 05/22/2014   Hyperglycemia 09/04/2013   Allergic rhinitis 08/06/2013   Generalized anxiety disorder 11/29/2009   Vitamin D deficiency 04/10/2008   Osteoporosis 10/04/2007    Past Medical History:  Diagnosis Date   Cellulitis of leg, right 08/27/2016   Hyperlipidemia    Osteopenia    Pneumonia    Vitamin D deficiency     Family History  Problem Relation Age of Onset   Melanoma Father    Hyperlipidemia  Mother    Heart disease Mother        no MI   Diabetes Paternal Grandmother        vision loss   Diabetes Maternal Uncle    Colon cancer Neg Hx    Stomach cancer Neg Hx    Esophageal cancer Neg Hx    Rectal cancer Neg Hx    Stroke Neg Hx    Past Surgical History:  Procedure Laterality Date   COLONOSCOPY  03/2011   Dr Brodie;diminuitive polyp   G 2 P 2      TONSILLECTOMY     Social History   Social History Narrative   Exercise:  Yoga, weights, walking      Two children, grandchildren   Immunization History  Administered Date(s) Administered   Fluad Quad(high Dose 65+) 11/18/2018   Influenza, High Dose Seasonal PF 04/30/2016, 12/06/2016, 12/15/2017, 12/29/2019   Influenza,inj,Quad PF,6+ Mos 01/19/2015   Influenza-Unspecified 12/22/2013, 12/06/2016, 11/17/2019   PFIZER(Purple Top)SARS-COV-2 Vaccination 04/12/2019, 05/02/2019, 12/29/2019   Pneumococcal Conjugate-13 10/17/2015   Pneumococcal Polysaccharide-23 01/27/2017   Td 11/22/2004   Tdap 10/03/2015   Zoster, Live 10/03/2015     Objective: Vital Signs: BP 130/84 (BP Location: Left Arm, Patient Position: Sitting, Cuff Size: Normal)   Pulse 80   Ht 5\' 4"  (1.626 m)   Wt 130 lb 3.2 oz (59.1 kg)   BMI 22.35 kg/m    Physical Exam Skin:    General: Skin is warm and dry.     Findings: No rash.  Neurological:     General: No focal deficit present.     Mental Status: She is alert.  Psychiatric:        Mood and Affect: Mood normal.     Musculoskeletal Exam:  Shoulders active abduction overhead decreased range of motion but passive intact, strength 4/5, no tenderness to palpation over AC joint, positive Hawkins and Neer's Elbows full ROM no tenderness or swelling Wrists full ROM no tenderness or swelling Fingers full ROM no tenderness or swelling Some lateral thigh pain with pace maneuver, lateral hip tenderness to palpation over greater trochanteric head Knees full ROM no tenderness or swelling, patellofemoral crepitus Ankles full ROM no tenderness or swelling   Investigation: No additional findings.  Imaging: DG Chest 2 View  Result Date: 09/03/2020 CLINICAL DATA:  Pneumonia follow-up EXAM: CHEST - 2 VIEW COMPARISON:  Chest x-ray and chest CT 08/08/2020 FINDINGS: Right upper lobe infiltrate shows mild progression. There is mild thickening of the major fissure. Remaining  lungs are clear.  No heart failure or effusion. IMPRESSION: Right upper lobe infiltrate with mild progression. Continued close follow-up recommended. Electronically Signed   By: Franchot Gallo M.D.   On: 09/03/2020 09:34   DG Chest 2 View  Result Date: 08/08/2020 CLINICAL DATA:  Right chest pain EXAM: CHEST - 2 VIEW COMPARISON:  09/25/2016 FINDINGS: Cardiomediastinal silhouette and pulmonary vasculature are within normal limits. Right upper lobe airspace opacities suspicious for pneumonia. Lungs otherwise clear. IMPRESSION: Right upper lobe airspace opacity most likely due to pneumonia. Radiographic follow-up to resolution is recommended to exclude lung mass. Electronically Signed   By: Miachel Roux M.D.   On: 08/08/2020 10:53   DG Shoulder Right  Result Date: 08/28/2020 CLINICAL DATA:  eval shoulder pain EXAM: RIGHT SHOULDER - 2+ VIEW COMPARISON:  None. FINDINGS: There is no evidence of acute fracture. There is mild glenohumeral and AC joint degenerative change. Mild greater tuberosity irregularity. 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  Electronically Signed   By: Maurine Simmering   On: 08/28/2020 13:04   CT Chest Wo Contrast  Result Date: 08/08/2020 CLINICAL DATA:  Pneumonia, effusion or abscess suspected in a 70 year old female. EXAM: CT CHEST WITHOUT CONTRAST TECHNIQUE: Multidetector CT imaging of the chest was performed following the standard protocol without IV contrast. COMPARISON:  None FINDINGS: Cardiovascular: No significant aortic atherosclerosis on noncontrast imaging. No aortic dilation. Normal heart size. No substantial pericardial effusion. Normal caliber of the central pulmonary vessels. Limited assessment of cardiovascular structures given lack of intravenous contrast. Mediastinum/Nodes: Thoracic inlet structures are normal. Esophagus grossly normal. No axillary lymphadenopathy. No mediastinal adenopathy. Scattered small lymph nodes in the mediastinum. No gross  hilar nodal enlargement on noncontrast evaluation. Lungs/Pleura: Nodular airspace disease in the RIGHT lateral chest along the fissural confluence in the RIGHT upper lobe measuring in total approximately 3.5 x 2.9 cm. Other areas tracking cephalad along the peripheral RIGHT upper lobe. Some subtle ground-glass in the area but the predominant abnormality is airspace disease and nodularity. Small discrete peripheral nodules in this location. Small nodules elsewhere in the chest, for instance on image 89 of series 4 measuring 5 mm. Pleural based nodule in the upper lobe on the LEFT (image 22/4) 7 mm. Biapical pleural and parenchymal scarring. No consolidation. No pleural effusion. Airways are patent. Scarring is evident in the RIGHT middle lobe. Upper Abdomen: Incidental imaging of upper abdominal contents without acute process. Imaged portions the liver, spleen, pancreas, adrenal glands and upper pole of LEFT kidney are unremarkable. Musculoskeletal: Spinal degenerative changes. No acute or destructive bone process. IMPRESSION: RIGHT upper lobe nodular airspace disease at the along the fissural confluence with small adjacent nodules could reflect infectious or inflammatory process. Not in a classic lobar distribution and with irregular appearance that makes neoplasm a differential consideration. Short interval follow-up is suggested to assess for resolution, this could be considered at 8-12 weeks. PET evaluation could also be considered based on appearance particularly if there are no symptoms that would suggest infection. Other small nodules in the chest largest in the RIGHT middle lobe at the periphery. Suggest attention on follow-up with subsequent follow-up as dictated based on follow-up studies. Electronically Signed   By: Zetta Bills M.D.   On: 08/08/2020 14:02   DG Shoulder Left  Result Date: 08/28/2020 CLINICAL DATA:  eval shoudler pain EXAM: LEFT SHOULDER - 2+ VIEW COMPARISON:  None. FINDINGS: There is  no evidence of acute fracture. There is mild glenohumeral and AC joint degenerative change. 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 Electronically Signed   By: Maurine Simmering   On: 08/28/2020 13:05   [place] LIMITED JOINT SPACE STRUCTURES UP BILAT(NO LINKED CHARGES)  Result Date: 08/30/2020 Formatting of this result is different from the original. Diagnostic Limited MSK Ultrasound of: Right shoulder Biceps tendon intact.  Small amount of hypoechoic fluid tracks within tendon sheath. Subscapularis tendon is intact. Supraspinatus tendon is intact. Mild subacromial bursitis. Infraspinatus tendon is intact. AC joint mild effusion Impression: Subacromial bursitis.  Small amount of hypoechoic fluid tracks within biceps tendon sheath   Diagnostic Limited MSK Ultrasound of: Left shoulder This of tendon is intact and normal-appearing Subscapularis tendon is intact. Supraspinatus tendon is intact. Small amount of subacromial bursitis present. Infraspinatus tendon is intact. Impression: Subacromial bursitis   Recent Labs: Lab Results  Component Value Date   WBC 6.4 08/08/2020   HGB 12.7 08/08/2020   PLT 214 08/08/2020   NA 137 08/08/2020   K 3.9  08/08/2020   CL 101 08/08/2020   CO2 27 08/08/2020   GLUCOSE 107 (H) 08/08/2020   BUN 12 08/08/2020   CREATININE 0.47 08/08/2020   BILITOT 0.6 08/03/2019   ALKPHOS 52 08/03/2019   AST 17 08/03/2019   ALT 11 08/03/2019   PROT 7.3 08/03/2019   ALBUMIN 4.6 08/03/2019   CALCIUM 9.4 08/08/2020   GFRAA 112 11/02/2019    Speciality Comments: No specialty comments available.  Procedures:  No procedures performed Allergies: Fosamax [alendronate]   Assessment / Plan:     Visit Diagnoses: PMR (polymyalgia rheumatica) (HCC)  Current symptoms do seem consistent with some residual activity of the PMR with bilateral shoulder and hip involvement.  Ultrasound findings of subacromial bursitis bilaterally is consistent with this disorder.   Inflammatory markers remain normal off any treatment at this time though.  Since she is having slow to resolve pneumonia changes on chest imaging I recommend we stay off any systemic steroids for now at least until her follow-up imaging later this month.  She reports previous shoulder injections were very painful and of limited benefit so not interested in any local steroid treatments.  Abnormal finding on CT scan  Suspected as most likely delayed resolution of recent pneumonia, upcoming repeat chest CT on 6/27.   Orders: No orders of the defined types were placed in this encounter.  No orders of the defined types were placed in this encounter.    Follow-Up Instructions: No follow-ups on file.   Collier Salina, MD  Note - This record has been created using [employer].  Chart creation errors have been sought, but may not always  have been located. Such creation errors do not reflect on  the standard of medical care.

## 2020-09-04 NOTE — Therapy (Signed)
[facility] [address] [place], [place], [address] Phone: [phone]   Fax:  [phone]  Physical Therapy Treatment  Patient Details  Name: Linda [name] MRN: [id number] Date of Birth: 10-Sep-1950 Referring Provider (PT): Lynne Leader   Encounter Date: 09/03/2020   PT End of Session - 09/04/20 1545     Visit Number 2    Number of Visits 12    Date for PT Re-Evaluation 10/10/20    Authorization Type [employer]    PT Start Time 1700    PT Stop Time 1557    PT Time Calculation (min) 42 min    Activity Tolerance Patient tolerated treatment well    Behavior During Therapy [facility] for tasks assessed/performed             Past Medical History:  Diagnosis Date   Cellulitis of leg, right 08/27/2016   Hyperlipidemia    Osteopenia    Pneumonia    Vitamin D deficiency     Past Surgical History:  Procedure Laterality Date   COLONOSCOPY  03/2011   Dr Brodie;diminuitive polyp   G 2 P 2     TONSILLECTOMY      There were no vitals filed for this visit.   Subjective Assessment - 09/04/20 1540     Subjective Pt states continued pain in shoulders.    Currently in Pain? Yes    Pain Score 8     Pain Location Shoulder    Pain Orientation Right;Left    Pain Descriptors / Indicators Aching    Pain Type Chronic pain    Pain Onset More than a month ago    Pain Frequency Intermittent                               [facility] Adult PT Treatment/Exercise - 09/04/20 0001       Exercises   Exercises Shoulder;Knee/Hip      Shoulder Exercises: Supine   Flexion 15 reps;AAROM      Shoulder Exercises: Standing   Row 20 reps    Theraband Level (Shoulder Row) Level 3 (Green)    Other Standing Exercises Flexion AROM x 10;      Shoulder Exercises: Pulleys   Flexion 3 minutes    ABduction 2 minutes      Shoulder Exercises: Stretch   Corner Stretch 3 reps;30 seconds    Corner Stretch Limitations doorway    Other Shoulder  Stretches Cat/Cow for thoracic mobility x 15;    Other Shoulder Stretches supine ER shoulder stretch x10;      Manual Therapy   Manual Therapy Joint mobilization;Passive ROM;Soft tissue mobilization    Joint Mobilization GHJ, post and inf mobs gr 3 bil; Thoracic PA mobs;    Passive ROM bil shoulders, all motions                      PT Short Term Goals - 09/03/20 1034       PT SHORT TERM GOAL #1   Title Pt to be independent with initial HEP    Time 2    Period Weeks    Status New    Target Date 09/12/20               PT Long Term Goals - 09/03/20 1035       PT LONG TERM GOAL #1   Title Pt to be independent with final  HEP    Time 6    Period Weeks    Status New    Target Date 10/10/20      PT LONG TERM GOAL #2   Title Pt to report decreased pain in bil shoulders to 0-1/10 with elevation , reaching, and lifting activities.    Time 6    Period Weeks    Status New    Target Date 10/10/20      PT LONG TERM GOAL #3   Title Pt to demo improved shoulder ROM to be WNL for elevation, to improve ability for ADLs and IADLs.    Time 6    Period Weeks    Status New    Target Date 10/10/20      PT LONG TERM GOAL #4   Title Pt to demo improved strength of bil knees and hips to at least 4+/5 to improve stability, stair ability, and ability for exercise.    Time 6    Period Weeks    Status New    Target Date 10/10/20                   Plan - 09/04/20 1550     Clinical Impression Statement Pt with thoracic stiffness and mild stiffness in bil GHJ. THer ex focus for improving ROM today. Pt with soreness at end range for flexion. Will benefit from progressvie ROM and strengthening as tolerated.    Personal Factors and Comorbidities Time since onset of injury/illness/exacerbation;Comorbidity 1    Comorbidities OP,    Examination-Activity Limitations Reach Overhead;Bathing;Carry;Stand;Lift;Dressing    Examination-Participation Restrictions Meal  Prep;Cleaning;Community Activity;Yard Work;Laundry;Shop    Stability/Clinical Decision Making Evolving/Moderate complexity    Rehab Potential Good    PT Frequency 2x / week    PT Duration 6 weeks    PT Treatment/Interventions ADLs/Self Care Home Management;Canalith Repostioning;Electrical Stimulation;Gait training;Ultrasound;Traction;Moist Heat;Iontophoresis 4mg /ml Dexamethasone;Stair training;Functional mobility training;Therapeutic activities;Therapeutic exercise;Balance training;Neuromuscular re-education;Manual techniques;Patient/family education;Passive range of motion;Dry needling;Joint Manipulations;Spinal Manipulations;Vasopneumatic Device;Taping;Cryotherapy;Orthotic Fit/Training;DME Instruction    PT Home Exercise Plan [id number]    Consulted and Agree with Plan of Care Patient             Patient will benefit from skilled therapeutic intervention in order to improve the following deficits and impairments:  Decreased range of motion, Pain, Impaired UE functional use, Increased muscle spasms, Decreased activity tolerance, Improper body mechanics, Impaired flexibility, Hypomobility, Decreased mobility, Decreased strength  Visit Diagnosis: Chronic left shoulder pain  Chronic right shoulder pain  Pain in right elbow  Chronic pain of left knee  Chronic pain of right knee     Problem List Patient Active Problem List   Diagnosis Date Noted   Abnormal finding on CT scan 09/04/2020   Aneurysm artery, 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   PMR (polymyalgia rheumatica) (HCC) 02/23/2020   Acute medial meniscal tear, left, initial encounter 02/16/2019   Dizziness 10/26/2018   Loss of transverse plantar arch 10/19/2018   Neuroma of foot 10/19/2018   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

## 2020-09-05 ENCOUNTER — Encounter: Payer: Medicare HMO | Admitting: Physical Therapy

## 2020-09-06 ENCOUNTER — Ambulatory Visit: Payer: Medicare HMO | Admitting: Physical Therapy

## 2020-09-06 ENCOUNTER — Other Ambulatory Visit: Payer: Self-pay

## 2020-09-06 DIAGNOSIS — M25561 Pain in right knee: Secondary | ICD-10-CM | POA: Diagnosis not present

## 2020-09-06 DIAGNOSIS — G8929 Other chronic pain: Secondary | ICD-10-CM | POA: Diagnosis not present

## 2020-09-06 DIAGNOSIS — M25562 Pain in left knee: Secondary | ICD-10-CM

## 2020-09-06 DIAGNOSIS — M25521 Pain in right elbow: Secondary | ICD-10-CM

## 2020-09-06 DIAGNOSIS — M25512 Pain in left shoulder: Secondary | ICD-10-CM | POA: Diagnosis not present

## 2020-09-06 DIAGNOSIS — M25511 Pain in right shoulder: Secondary | ICD-10-CM

## 2020-09-10 ENCOUNTER — Ambulatory Visit: Payer: Medicare HMO | Admitting: Physical Therapy

## 2020-09-10 ENCOUNTER — Encounter: Payer: Self-pay | Admitting: Physical Therapy

## 2020-09-10 ENCOUNTER — Other Ambulatory Visit: Payer: Self-pay

## 2020-09-10 ENCOUNTER — Ambulatory Visit (INDEPENDENT_AMBULATORY_CARE_PROVIDER_SITE_OTHER): Payer: Medicare HMO

## 2020-09-10 DIAGNOSIS — M25562 Pain in left knee: Secondary | ICD-10-CM

## 2020-09-10 DIAGNOSIS — M25512 Pain in left shoulder: Secondary | ICD-10-CM | POA: Diagnosis not present

## 2020-09-10 DIAGNOSIS — M25511 Pain in right shoulder: Secondary | ICD-10-CM | POA: Diagnosis not present

## 2020-09-10 DIAGNOSIS — G8929 Other chronic pain: Secondary | ICD-10-CM

## 2020-09-10 DIAGNOSIS — M25521 Pain in right elbow: Secondary | ICD-10-CM | POA: Diagnosis not present

## 2020-09-10 DIAGNOSIS — M25561 Pain in right knee: Secondary | ICD-10-CM | POA: Diagnosis not present

## 2020-09-10 DIAGNOSIS — Z0000 Encounter for general adult medical examination without abnormal findings: Secondary | ICD-10-CM

## 2020-09-10 NOTE — Progress Notes (Signed)
I connected with Linda [name] today by telephone and verified that I am speaking with the correct person using two identifiers. Location patient: home Location provider: work Persons participating in the virtual visit: Sharion Grieves and Lisette Abu, LPN..   I discussed the limitations, risks, security and privacy concerns of performing an evaluation and management service by telephone and the availability of in person appointments. I also discussed with the patient that there may be a patient responsible charge related to this service. The patient expressed understanding and verbally consented to this telephonic visit.    Interactive audio and video telecommunications were attempted between this provider and patient, however failed, due to patient having technical difficulties OR patient did not have access to video capability.  We continued and completed visit with audio only.  Some vital signs may be absent or patient reported.   Time Spent with patient on telephone encounter: 30 minutes  Subjective:   Linda [name] is a 70 y.o. female who presents for Medicare Annual (Subsequent) preventive examination.  Review of Systems     Cardiac Risk Factors include: advanced age (>27men, >77 women);family history of premature cardiovascular disease     Objective:    Today's Vitals   09/10/20 0918  PainSc: 8    There is no height or weight on file to calculate BMI.  Advanced Directives 09/10/2020 09/03/2020 08/29/2020 08/08/2020 08/28/2019 08/03/2019  Does Patient Have a Medical Advance Directive? - No No No No No  Would patient like information on creating a medical advance directive? No - Patient declined No - Patient declined No - Patient declined No - Patient declined No - Patient declined No - Patient declined    Current Medications (verified) Outpatient Encounter Medications as of 09/10/2020  Medication Sig   Ascorbic Acid (VITAMIN C) 1000 MG tablet Take 1,000 mg by mouth daily.    Calcium Carb-Cholecalciferol (CALCIUM 1000 + D PO) Take by mouth daily.   cholecalciferol (VITAMIN D) 1000 units tablet Take 1.5 tablets (1,500 Units total) by mouth daily.   tretinoin (RETIN-A) 0.05 % cream APPLY A PEASIZE AMOUNT ONTO THE FACE NIGHTLY   No facility-administered encounter medications on file as of 09/10/2020.    Allergies (verified) Fosamax [alendronate]   History: Past Medical History:  Diagnosis Date   Cellulitis of leg, right 08/27/2016   Hyperlipidemia    Osteopenia    Pneumonia    Vitamin D deficiency    Past Surgical History:  Procedure Laterality Date   COLONOSCOPY  03/2011   Dr Brodie;diminuitive polyp   G 2 P 2     TONSILLECTOMY     Family History  Problem Relation Age of Onset   Melanoma Father    Hyperlipidemia Mother    Heart disease Mother        no MI   Diabetes Paternal Grandmother        vision loss   Diabetes Maternal Uncle    Colon cancer Neg Hx    Stomach cancer Neg Hx    Esophageal cancer Neg Hx    Rectal cancer Neg Hx    Stroke Neg Hx    Social History   Socioeconomic History   Marital status: Married    Spouse name: Not on file   Number of children: 2   Years of education: Not on file   Highest education level: Not on file  Occupational History   Occupation: banking  Tobacco Use   Smoking status: Never   Smokeless tobacco: Never  Vaping Use   Vaping Use: Never used  Substance and Sexual Activity   Alcohol use: Yes    Comment:  1-2 wine or beer nightly   Drug use: No   Sexual activity: Not on file  Other Topics Concern   Not on file  Social History Narrative   Exercise:  Yoga, weights, walking      Two children, grandchildren   Social Determinants of Health   Financial Resource Strain: Low Risk    Difficulty of Paying Living Expenses: Not hard at all  Food Insecurity: No Food Insecurity   Worried About Charity fundraiser in the Last Year: Never true   Ran Out of Food in the Last Year: Never true   Transportation Needs: No Transportation Needs   Lack of Transportation (Medical): No   Lack of Transportation (Non-Medical): No  Physical Activity: Sufficiently Active   Days of Exercise per Week: 7 days   Minutes of Exercise per Session: 60 min  Stress: No Stress Concern Present   Feeling of Stress : Not at all  Social Connections: Socially Integrated   Frequency of Communication with Friends and Family: More than three times a week   Frequency of Social Gatherings with Friends and Family: More than three times a week   Attends Religious Services: More than 4 times per year   Active Member of [employer] or Organizations: No   Attends Music therapist: More than 4 times per year   Marital Status: Married    Tobacco Counseling Counseling given: Not Answered   Clinical Intake:  Pre-visit preparation completed: Yes  Pain : 0-10 Pain Score: 8  Pain Type: Chronic pain Pain Location: Shoulder Pain Orientation: Right, Left Pain Descriptors / Indicators: Aching Pain Onset: More than a month ago Pain Frequency: Constant Pain Relieving Factors: Prednisone Effect of Pain on Daily Activities: Pain can diminish job performance, lower motivation to exercise, and prevent you from completing daily tasks. Pain produces disability and affects the quality of life.  Pain Type: Chronic pain Pain Orientation: Other (Comment) (joint & muscle pain) Pain Descriptors / Indicators: Aching, Constant, Discomfort Pain Frequency: Constant Pain Relieving Factors: Prednisone  Nutritional Risks: None Diabetes: No  How often do you need to have someone help you when you read instructions, pamphlets, or other written materials from your doctor or pharmacy?: 1 - Never What is the last grade level you completed in school?: Associate's Degree  Diabetic? no  Interpreter Needed?: No  Information entered by :: Lisette Abu, LPN   Activities of Daily Living In your present state of health,  do you have any difficulty performing the following activities: 09/10/2020  Hearing? N  Vision? N  Difficulty concentrating or making decisions? N  Walking or climbing stairs? N  Dressing or bathing? N  Doing errands, shopping? N  Preparing Food and eating ? N  Using the Toilet? N  In the past six months, have you accidently leaked urine? N  Do you have problems with loss of bowel control? N  Managing your Medications? N  Managing your Finances? N  Housekeeping or managing your Housekeeping? N  Some recent data might be hidden    Patient Care Team: Binnie Rail, MD as PCP - General (Internal Medicine)  Indicate any recent Medical Services you may have received from other than [facility] providers in the past year (date may be approximate).     Assessment:   This is a routine wellness examination for [place].  Hearing/Vision  screen Hearing Screening - Comments:: Patient denies any hearing difficulties. Vision Screening - Comments:: Patient wears contacts.  Eye exams done at [facility].  Dietary issues and exercise activities discussed: Current Exercise Habits: Structured exercise class, Type of exercise: stretching;strength training/weights;walking, Time (Minutes): 60, Frequency (Times/Week): 7, Weekly Exercise (Minutes/Week): 420, Intensity: Mild, Exercise limited by: orthopedic condition(s);Other - see comments (polymyalgia rheumatitis)   Goals Addressed   None   Depression Screen PHQ 2/9 Scores 09/10/2020 08/14/2020 08/03/2019 01/27/2017 03/26/2016  PHQ - 2 Score 0 1 0 0 0  PHQ- 9 Score - 5 - - -    Fall Risk Fall Risk  09/10/2020 08/14/2020 08/03/2019 08/03/2019 01/27/2017  Falls in the past year? 0 0 0 0 No  Number falls in past yr: 0 0 0 0 -  Injury with Fall? 0 0 0 0 -  Risk for fall due to : No Fall Risks No Fall Risks No Fall Risks - -  Follow up Falls evaluation completed Falls evaluation completed Falls evaluation completed;Education provided;Falls prevention discussed  Falls evaluation completed -    FALL RISK PREVENTION PERTAINING TO THE HOME:  Any stairs in or around the home? Yes  If so, are there any without handrails? No  Home free of loose throw rugs in walkways, pet beds, electrical cords, etc? Yes  Adequate lighting in your home to reduce risk of falls? Yes   ASSISTIVE DEVICES UTILIZED TO PREVENT FALLS:  Life alert? No  Use of a cane, walker or w/c? No  Grab bars in the bathroom? No  Shower chair or bench in shower? No  Elevated toilet seat or a handicapped toilet? No   TIMED UP AND GO:  Was the test performed? No .  Length of time to ambulate 10 feet: 0 sec.   Gait steady and fast without use of assistive device (per patient)  Cognitive Function:Normal cognitive status assessed by direct observation by this Nurse Health Advisor. No abnormalities found.      6CIT Screen 08/03/2019  What Year? 0 points  What month? 0 points  What time? 0 points  Count back from 20 0 points  Months in reverse 0 points  Repeat phrase 0 points  Total Score 0    Immunizations Immunization History  Administered Date(s) Administered   Fluad Quad(high Dose 65+) 11/18/2018   Influenza, High Dose Seasonal PF 04/30/2016, 12/06/2016, 12/15/2017, 12/29/2019   Influenza,inj,Quad PF,6+ Mos 01/19/2015   Influenza-Unspecified 12/22/2013, 12/06/2016, 11/17/2019   PFIZER(Purple Top)SARS-COV-2 Vaccination 04/12/2019, 05/02/2019, 12/29/2019   Pneumococcal Conjugate-13 10/17/2015   Pneumococcal Polysaccharide-23 01/27/2017   Td 11/22/2004   Tdap 10/03/2015   Zoster, Live 10/03/2015    TDAP status: Up to date  Flu Vaccine status: Up to date  Pneumococcal vaccine status: Up to date  Covid-19 vaccine status: Completed vaccines  Qualifies for Shingles Vaccine? Yes   Zostavax completed Yes   Shingrix Completed?: No.    Education has been provided regarding the importance of this vaccine. Patient has been advised to call insurance company to determine out  of pocket expense if they have not yet received this vaccine. Advised may also receive vaccine at local pharmacy or Health Dept. Verbalized acceptance and understanding.  Screening Tests Health Maintenance  Topic Date Due   Zoster Vaccines- Shingrix (1 of 2) Never done   COVID-19 Vaccine (4 - Booster for Pfizer series) 04/30/2020   MAMMOGRAM  08/19/2020   INFLUENZA VACCINE  10/22/2020   COLONOSCOPY (Pts 45-13yrs Insurance coverage will need to  be confirmed)  04/17/2021   DEXA SCAN  05/29/2021   TETANUS/TDAP  10/02/2025   Hepatitis C Screening  Completed   PNA vac Low Risk Adult  Completed   HPV VACCINES  Aged Out    Health Maintenance  Health Maintenance Due  Topic Date Due   Zoster Vaccines- Shingrix (1 of 2) Never done   COVID-19 Vaccine (4 - Booster for Pfizer series) 04/30/2020   MAMMOGRAM  08/19/2020    Colorectal cancer screening: Type of screening: Colonoscopy. Completed 04/18/2011. Repeat every 10 years  Mammogram status: Completed 12/12/2019. Repeat every year  Bone Density status: Completed 05/30/2019. Results reflect: Bone density results: OSTEOPOROSIS. Repeat every 2 years.  Lung Cancer Screening: (Low Dose CT Chest recommended if Age 5-80 years, 30 pack-year currently smoking OR have quit w/in 15years.) does not qualify.   Lung Cancer Screening Referral: no  Additional Screening:  Hepatitis C Screening: does qualify; Completed yes  Vision Screening: Recommended annual ophthalmology exams for early detection of glaucoma and other disorders of the eye. Is the patient up to date with their annual eye exam?  Yes  Who is the provider or what is the name of the office in which the patient attends annual eye exams? [facility] If pt is not established with a provider, would they like to be referred to a provider to establish care? No .   Dental Screening: Recommended annual dental exams for proper oral hygiene  Community Resource Referral / Chronic Care  Management: CRR required this visit?  No   CCM required this visit?  No      Plan:     I have personally reviewed and noted the following in the patient's chart:   Medical and social history Use of alcohol, tobacco or illicit drugs  Current medications and supplements including opioid prescriptions.  Functional ability and status Nutritional status Physical activity Advanced directives List of other physicians Hospitalizations, surgeries, and ER visits in previous 12 months Vitals Screenings to include cognitive, depression, and falls Referrals and appointments  In addition, I have reviewed and discussed with patient certain preventive protocols, quality metrics, and best practice recommendations. A written personalized care plan for preventive services as well as general preventive health recommendations were provided to patient.     Sheral Flow, LPN   07/08/3843   Nurse Notes:  Patient is cogitatively intact. There were no vitals filed for this visit. There is no height or weight on file to calculate BMI. Patient stated that she has no issues with gait or balance; does not use any assistive devices. Medications reviewed with patient; no opioid use noted.

## 2020-09-10 NOTE — Therapy (Signed)
[facility] [address] [place], [place], [address] Phone: [phone]   Fax:  [phone]  Physical Therapy Treatment  Patient Details  Name: Linda [name] MRN: [id number] Date of Birth: Mar 27, 1950 Referring Provider (PT): Lynne Leader   Encounter Date: 09/06/2020   PT End of Session - 09/10/20 0958     Visit Number 3    Number of Visits 12    Date for PT Re-Evaluation 10/10/20    Authorization Type [employer]    PT Start Time 0355    PT Stop Time 1555    PT Time Calculation (min) 40 min    Activity Tolerance Patient tolerated treatment well    Behavior During Therapy [facility] for tasks assessed/performed             Past Medical History:  Diagnosis Date   Cellulitis of leg, right 08/27/2016   Hyperlipidemia    Osteopenia    Pneumonia    Vitamin D deficiency     Past Surgical History:  Procedure Laterality Date   COLONOSCOPY  03/2011   Dr Brodie;diminuitive polyp   G 2 P 2     TONSILLECTOMY      There were no vitals filed for this visit.   Subjective Assessment - 09/10/20 0900     Subjective Pt states minimal change in shoulder pain    Currently in Pain? Yes    Pain Score 6     Pain Location Shoulder    Pain Orientation Left;Right    Pain Descriptors / Indicators Aching    Pain Type Chronic pain    Pain Onset More than a month ago    Pain Frequency Intermittent                               [facility] Adult PT Treatment/Exercise - 09/10/20 0001       Exercises   Exercises Shoulder;Knee/Hip      Shoulder Exercises: Supine   Protraction 10 reps    Protraction Limitations cane    Flexion 15 reps;AAROM    Flexion Limitations cane      Shoulder Exercises: Standing   Row 20 reps    Theraband Level (Shoulder Row) Level 3 (Green)    Other Standing Exercises Flexion AROM x 10;    Other Standing Exercises bil shoulder ER YTB x 15;      Shoulder Exercises: Pulleys   Flexion 3 minutes     ABduction 2 minutes      Shoulder Exercises: Stretch   Corner Stretch 3 reps;30 seconds    Corner Stretch Limitations doorway    Other Shoulder Stretches Cat/Cow for thoracic mobility x 15;    Other Shoulder Stretches supine ER shoulder stretch x10;      Manual Therapy   Manual Therapy Joint mobilization;Passive ROM;Soft tissue mobilization    Joint Mobilization GHJ, post and inf mobs gr 3 bil; Thoracic PA mobs;    Passive ROM bil shoulders, all motions                      PT Short Term Goals - 09/03/20 1034       PT SHORT TERM GOAL #1   Title Pt to be independent with initial HEP    Time 2    Period Weeks    Status New    Target Date 09/12/20  PT Long Term Goals - 09/03/20 1035       PT LONG TERM GOAL #1   Title Pt to be independent with final HEP    Time 6    Period Weeks    Status New    Target Date 10/10/20      PT LONG TERM GOAL #2   Title Pt to report decreased pain in bil shoulders to 0-1/10 with elevation , reaching, and lifting activities.    Time 6    Period Weeks    Status New    Target Date 10/10/20      PT LONG TERM GOAL #3   Title Pt to demo improved shoulder ROM to be WNL for elevation, to improve ability for ADLs and IADLs.    Time 6    Period Weeks    Status New    Target Date 10/10/20      PT LONG TERM GOAL #4   Title Pt to demo improved strength of bil knees and hips to at least 4+/5 to improve stability, stair ability, and ability for exercise.    Time 6    Period Weeks    Status New    Target Date 10/10/20                   Plan - 09/10/20 0959     Clinical Impression Statement Pt with more stiffness and limitaiton for R shoulder vs L. She has minimal increased pain with ther ex today, but continues to have overall soreness. Plan to progress light strengthening as tolerated.    Personal Factors and Comorbidities Time since onset of injury/illness/exacerbation;Comorbidity 1    Comorbidities OP,     Examination-Activity Limitations Reach Overhead;Bathing;Carry;Stand;Lift;Dressing    Examination-Participation Restrictions Meal Prep;Cleaning;Community Activity;Yard Work;Laundry;Shop    Stability/Clinical Decision Making Evolving/Moderate complexity    Rehab Potential Good    PT Frequency 2x / week    PT Duration 6 weeks    PT Treatment/Interventions ADLs/Self Care Home Management;Canalith Repostioning;Electrical Stimulation;Gait training;Ultrasound;Traction;Moist Heat;Iontophoresis 4mg /ml Dexamethasone;Stair training;Functional mobility training;Therapeutic activities;Therapeutic exercise;Balance training;Neuromuscular re-education;Manual techniques;Patient/family education;Passive range of motion;Dry needling;Joint Manipulations;Spinal Manipulations;Vasopneumatic Device;Taping;Cryotherapy;Orthotic Fit/Training;DME Instruction    PT Home Exercise Plan [id number]    Consulted and Agree with Plan of Care Patient             Patient will benefit from skilled therapeutic intervention in order to improve the following deficits and impairments:  Decreased range of motion, Pain, Impaired UE functional use, Increased muscle spasms, Decreased activity tolerance, Improper body mechanics, Impaired flexibility, Hypomobility, Decreased mobility, Decreased strength  Visit Diagnosis: Chronic left shoulder pain  Chronic right shoulder pain  Pain in right elbow  Chronic pain of left knee  Chronic pain of right knee     Problem List Patient Active Problem List   Diagnosis Date Noted   Abnormal finding on CT scan 09/04/2020   Aneurysm artery, 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[place]) 09/03/2020   PMR (polymyalgia rheumatica) ([place]) 02/23/2020   Acute medial meniscal tear, left, initial encounter 02/16/2019   Dizziness 10/26/2018   Loss of transverse plantar arch 10/19/2018   Neuroma of foot 10/19/2018   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

## 2020-09-10 NOTE — Therapy (Signed)
[facility] [address] [place], [place], [address] Phone: [phone]   Fax:  [phone]  Physical Therapy Treatment  Patient Details  Name: Linda [name] MRN: [id number] Date of Birth: [birth year] Referring Provider (PT): Lynne Leader   Encounter Date: 09/10/2020   PT End of Session - 09/10/20 1204     Visit Number 4    Number of Visits 12    Date for PT Re-Evaluation 10/10/20    Authorization Type [employer]    PT Start Time 1100    PT Stop Time 1139    PT Time Calculation (min) 39 min    Activity Tolerance Patient tolerated treatment well    Behavior During Therapy [facility] for tasks assessed/performed             Past Medical History:  Diagnosis Date   Cellulitis of leg, right 08/27/2016   Hyperlipidemia    Osteopenia    Pneumonia    Vitamin D deficiency     Past Surgical History:  Procedure Laterality Date   COLONOSCOPY  03/2011   Dr Brodie;diminuitive polyp   G 2 P 2     TONSILLECTOMY      There were no vitals filed for this visit.   Subjective Assessment - 09/10/20 1203     Subjective Pt states minimal change in shoulder pain    Currently in Pain? Yes    Pain Location Shoulder    Pain Orientation Left;Right    Pain Descriptors / Indicators Aching    Pain Type Chronic pain    Pain Onset More than a month ago    Pain Frequency Intermittent                               [facility] Adult PT Treatment/Exercise - 09/10/20 1113       Exercises   Exercises Shoulder;Knee/Hip      Shoulder Exercises: Supine   Protraction 10 reps    Protraction Limitations cane    Flexion 15 reps;AAROM    Flexion Limitations cane      Shoulder Exercises: Seated   Other Seated Exercises Thoracic rotation x 10 with light overpressure    Other Seated Exercises Cervical extension x 10 , UT stretch 30 sec x 2 bil;      Shoulder Exercises: Prone   Other Prone Exercises locust pose 10 sec x 5;      Shoulder  Exercises: Standing   Row 20 reps    Theraband Level (Shoulder Row) Level 3 (Green)    Other Standing Exercises Wall angel, modified,  AROM x 10;    Other Standing Exercises bil shoulder ER YTB x 15;      Shoulder Exercises: Pulleys   Flexion 3 minutes    ABduction 2 minutes      Shoulder Exercises: Stretch   Corner Stretch 3 reps;30 seconds    Corner Stretch Limitations doorway    Other Shoulder Stretches standing stall stretch L/R/Center for Lats x 2 min;    Other Shoulder Stretches supine ER shoulder stretch x10;      Manual Therapy   Manual Therapy Joint mobilization;Passive ROM;Soft tissue mobilization    Joint Mobilization GHJ, post and inf mobs gr 3 bil; Thoracic PA mobs;    Passive ROM bil shoulders, all motions                      PT Short Term  Goals - 09/03/20 1034       PT SHORT TERM GOAL #1   Title Pt to be independent with initial HEP    Time 2    Period Weeks    Status New    Target Date 09/12/20               PT Long Term Goals - 09/03/20 1035       PT LONG TERM GOAL #1   Title Pt to be independent with final HEP    Time 6    Period Weeks    Status New    Target Date 10/10/20      PT LONG TERM GOAL #2   Title Pt to report decreased pain in bil shoulders to 0-1/10 with elevation , reaching, and lifting activities.    Time 6    Period Weeks    Status New    Target Date 10/10/20      PT LONG TERM GOAL #3   Title Pt to demo improved shoulder ROM to be WNL for elevation, to improve ability for ADLs and IADLs.    Time 6    Period Weeks    Status New    Target Date 10/10/20      PT LONG TERM GOAL #4   Title Pt to demo improved strength of bil knees and hips to at least 4+/5 to improve stability, stair ability, and ability for exercise.    Time 6    Period Weeks    Status New    Target Date 10/10/20                   Plan - 09/10/20 1205     Clinical Impression Statement Pt with mild improvments in flexion ROM.  Focus on shoulder and thoracic mobility today. Educated on standing Lat stretch for shoulder ROM, pt with diffiuclty with childs pose stretch due to knees.  Pt continues to have limitations in t-spine that may be efffecting shoulder ROM, and also has pain levles that are still moderate, with most all elevation motions daily    Personal Factors and Comorbidities Time since onset of injury/illness/exacerbation;Comorbidity 1    Comorbidities OP,    Examination-Activity Limitations Reach Overhead;Bathing;Carry;Stand;Lift;Dressing    Examination-Participation Restrictions Meal Prep;Cleaning;Community Activity;Yard Work;Laundry;Shop    Stability/Clinical Decision Making Evolving/Moderate complexity    Rehab Potential Good    PT Frequency 2x / week    PT Duration 6 weeks    PT Treatment/Interventions ADLs/Self Care Home Management;Canalith Repostioning;Electrical Stimulation;Gait training;Ultrasound;Traction;Moist Heat;Iontophoresis 4mg /ml Dexamethasone;Stair training;Functional mobility training;Therapeutic activities;Therapeutic exercise;Balance training;Neuromuscular re-education;Manual techniques;Patient/family education;Passive range of motion;Dry needling;Joint Manipulations;Spinal Manipulations;Vasopneumatic Device;Taping;Cryotherapy;Orthotic Fit/Training;DME Instruction    PT Home Exercise Plan [id number]    Consulted and Agree with Plan of Care Patient             Patient will benefit from skilled therapeutic intervention in order to improve the following deficits and impairments:  Decreased range of motion, Pain, Impaired UE functional use, Increased muscle spasms, Decreased activity tolerance, Improper body mechanics, Impaired flexibility, Hypomobility, Decreased mobility, Decreased strength  Visit Diagnosis: Chronic left shoulder pain  Chronic right shoulder pain  Chronic pain of left knee  Chronic pain of right knee  Pain in right elbow     Problem List Patient Active Problem  List   Diagnosis Date Noted   Abnormal finding on CT scan 09/04/2020   Aneurysm artery, 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   PMR (polymyalgia rheumatica) ([facility]) 02/23/2020   Acute medial  meniscal tear, left, initial encounter 02/16/2019   Dizziness 10/26/2018   Loss of transverse plantar arch 10/19/2018   Neuroma of foot 10/19/2018   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

## 2020-09-10 NOTE — Patient Instructions (Addendum)
Linda [name] , Thank you for taking time to come for your Medicare Wellness Visit. I appreciate your ongoing commitment to your health goals. Please review the following plan we discussed and let me know if I can assist you in the future.   Screening recommendations/referrals: Colonoscopy: last done 04/18/2011; due every 10 years (due 04/17/2021) Mammogram: 12/12/2019; due every 2 years (due 12/11/2021) Bone Density: last done 05/30/2019; due every 2 years (due 05/29/2021) Recommended yearly ophthalmology/optometry visit for glaucoma screening and checkup Recommended yearly dental visit for hygiene and checkup  Vaccinations: Influenza vaccine: 12/29/2019 Pneumococcal vaccine: 10/17/2015, 01/27/2017 Tdap vaccine: 10/03/2015; due every 10 years (due 10/02/2025) Shingles vaccine: never done   Covid-19: 04/12/2019, 05/02/2019, 12/29/2019  Advanced directives: Advance directive discussed with you today. Even though you declined this today please call our office should you change your mind and we can give you the proper paperwork for you to fill out.  Conditions/risks identified: Yes; my goal is to continue to exercise everyday, which helps with polymyalgia.  Next appointment: 09/11/2021 at 9:00 am via telephone   Preventive Care 65 Years and Older, Female Preventive care refers to lifestyle choices and visits with your health care provider that can promote health and wellness. What does preventive care include? A yearly physical exam. This is also called an annual well check. Dental exams once or twice a year. Routine eye exams. Ask your health care provider how often you should have your eyes checked. Personal lifestyle choices, including: Daily care of your teeth and gums. Regular physical activity. Eating a healthy diet. Avoiding tobacco and drug use. Limiting alcohol use. Practicing safe sex. Taking low-dose aspirin every day. Taking vitamin and mineral supplements as recommended by your health care  provider. What happens during an annual well check? The services and screenings done by your health care provider during your annual well check will depend on your age, overall health, lifestyle risk factors, and family history of disease. Counseling  Your health care provider may ask you questions about your: Alcohol use. Tobacco use. Drug use. Emotional well-being. Home and relationship well-being. Sexual activity. Eating habits. History of falls. Memory and ability to understand (cognition). Work and work Statistician. Reproductive health. Screening  You may have the following tests or measurements: Height, weight, and BMI. Blood pressure. Lipid and cholesterol levels. These may be checked every 5 years, or more frequently if you are over 61 years old. Skin check. Lung cancer screening. You may have this screening every year starting at age 40 if you have a 30-pack-year history of smoking and currently smoke or have quit within the past 15 years. Fecal occult blood test (FOBT) of the stool. You may have this test every year starting at age 44. Flexible sigmoidoscopy or colonoscopy. You may have a sigmoidoscopy every 5 years or a colonoscopy every 10 years starting at age 15. Hepatitis C blood test. Hepatitis B blood test. Sexually transmitted disease (STD) testing. Diabetes screening. This is done by checking your blood sugar (glucose) after you have not eaten for a while (fasting). You may have this done every 1-3 years. Bone density scan. This is done to screen for osteoporosis. You may have this done starting at age 66. Mammogram. This may be done every 1-2 years. Talk to your health care provider about how often you should have regular mammograms. Talk with your health care provider about your test results, treatment options, and if necessary, the need for more tests. Vaccines  Your health care provider may  recommend certain vaccines, such as: Influenza vaccine. This is  recommended every year. Tetanus, diphtheria, and acellular pertussis (Tdap, Td) vaccine. You may need a Td booster every 10 years. Zoster vaccine. You may need this after age 68. Pneumococcal 13-valent conjugate (PCV13) vaccine. One dose is recommended after age 67. Pneumococcal polysaccharide (PPSV23) vaccine. One dose is recommended after age 5. Talk to your health care provider about which screenings and vaccines you need and how often you need them. This information is not intended to replace advice given to you by your health care provider. Make sure you discuss any questions you have with your health care provider. Document Released: 04/06/2015 Document Revised: 11/28/2015 Document Reviewed: 01/09/2015 Elsevier Interactive Patient Education  2017 [place] Prevention in the Home Falls can cause injuries. They can happen to people of all ages. There are many things you can do to make your home safe and to help prevent falls. What can I do on the outside of my home? Regularly fix the edges of walkways and driveways and fix any cracks. Remove anything that might make you trip as you walk through a door, such as a raised step or threshold. Trim any bushes or trees on the path to your home. Use bright outdoor lighting. Clear any walking paths of anything that might make someone trip, such as rocks or tools. Regularly check to see if handrails are loose or broken. Make sure that both sides of any steps have handrails. Any raised decks and porches should have guardrails on the edges. Have any leaves, snow, or ice cleared regularly. Use sand or salt on walking paths during winter. Clean up any spills in your garage right away. This includes oil or grease spills. What can I do in the bathroom? Use night lights. Install grab bars by the toilet and in the tub and shower. Do not use towel bars as grab bars. Use non-skid mats or decals in the tub or shower. If you need to sit down in  the shower, use a plastic, non-slip stool. Keep the floor dry. Clean up any water that spills on the floor as soon as it happens. Remove soap buildup in the tub or shower regularly. Attach bath mats securely with double-sided non-slip rug tape. Do not have throw rugs and other things on the floor that can make you trip. What can I do in the bedroom? Use night lights. Make sure that you have a light by your bed that is easy to reach. Do not use any sheets or blankets that are too big for your bed. They should not hang down onto the floor. Have a firm chair that has side arms. You can use this for support while you get dressed. Do not have throw rugs and other things on the floor that can make you trip. What can I do in the kitchen? Clean up any spills right away. Avoid walking on wet floors. Keep items that you use a lot in easy-to-reach places. If you need to reach something above you, use a strong step stool that has a grab bar. Keep electrical cords out of the way. Do not use floor polish or wax that makes floors slippery. If you must use wax, use non-skid floor wax. Do not have throw rugs and other things on the floor that can make you trip. What can I do with my stairs? Do not leave any items on the stairs. Make sure that there are handrails on both sides of  the stairs and use them. Fix handrails that are broken or loose. Make sure that handrails are as long as the stairways. Check any carpeting to make sure that it is firmly attached to the stairs. Fix any carpet that is loose or worn. Avoid having throw rugs at the top or bottom of the stairs. If you do have throw rugs, attach them to the floor with carpet tape. Make sure that you have a light switch at the top of the stairs and the bottom of the stairs. If you do not have them, ask someone to add them for you. What else can I do to help prevent falls? Wear shoes that: Do not have high heels. Have rubber bottoms. Are comfortable  and fit you well. Are closed at the toe. Do not wear sandals. If you use a stepladder: Make sure that it is fully opened. Do not climb a closed stepladder. Make sure that both sides of the stepladder are locked into place. Ask someone to hold it for you, if possible. Clearly mark and make sure that you can see: Any grab bars or handrails. First and last steps. Where the edge of each step is. Use tools that help you move around (mobility aids) if they are needed. These include: Canes. Walkers. Scooters. Crutches. Turn on the lights when you go into a dark area. Replace any light bulbs as soon as they burn out. Set up your furniture so you have a clear path. Avoid moving your furniture around. If any of your floors are uneven, fix them. If there are any pets around you, be aware of where they are. Review your medicines with your doctor. Some medicines can make you feel dizzy. This can increase your chance of falling. Ask your doctor what other things that you can do to help prevent falls. This information is not intended to replace advice given to you by your health care provider. Make sure you discuss any questions you have with your health care provider. Document Released: 01/04/2009 Document Revised: 08/16/2015 Document Reviewed: 04/14/2014 Elsevier Interactive Patient Education  2017 [employer].

## 2020-09-12 ENCOUNTER — Ambulatory Visit: Payer: Medicare HMO | Admitting: Physical Therapy

## 2020-09-12 ENCOUNTER — Encounter: Payer: Self-pay | Admitting: Physical Therapy

## 2020-09-12 DIAGNOSIS — M25512 Pain in left shoulder: Secondary | ICD-10-CM | POA: Diagnosis not present

## 2020-09-12 DIAGNOSIS — M25561 Pain in right knee: Secondary | ICD-10-CM | POA: Diagnosis not present

## 2020-09-12 DIAGNOSIS — M25521 Pain in right elbow: Secondary | ICD-10-CM

## 2020-09-12 DIAGNOSIS — G8929 Other chronic pain: Secondary | ICD-10-CM

## 2020-09-12 DIAGNOSIS — M25511 Pain in right shoulder: Secondary | ICD-10-CM | POA: Diagnosis not present

## 2020-09-12 DIAGNOSIS — M25562 Pain in left knee: Secondary | ICD-10-CM | POA: Diagnosis not present

## 2020-09-12 NOTE — Therapy (Signed)
[facility] [address] [place], [place], [address] Phone: [phone]   Fax:  [phone]  Physical Therapy Treatment  Patient Details  Name: Linda [name] MRN: [id number] Date of Birth: 05/31/1950 Referring Provider (PT): Lynne Leader   Encounter Date: 09/12/2020   PT End of Session - 09/12/20 1013     Visit Number 5    Number of Visits 12    Date for PT Re-Evaluation 10/10/20    Authorization Type [employer]    PT Start Time 0801    PT Stop Time 0842    PT Time Calculation (min) 41 min    Activity Tolerance Patient tolerated treatment well    Behavior During Therapy [facility] for tasks assessed/performed             Past Medical History:  Diagnosis Date   Cellulitis of leg, right 08/27/2016   Hyperlipidemia    Osteopenia    Pneumonia    Vitamin D deficiency     Past Surgical History:  Procedure Laterality Date   COLONOSCOPY  03/2011   Dr Brodie;diminuitive polyp   G 2 P 2     TONSILLECTOMY      There were no vitals filed for this visit.   Subjective Assessment - 09/12/20 1012     Subjective Pt states mild soreness in shoulders. Knees and elbow still "sore" but not too bothersome.    Currently in Pain? Yes    Pain Score 6     Pain Location Shoulder    Pain Orientation Left;Right    Pain Descriptors / Indicators Aching    Pain Type Chronic pain    Pain Onset More than a month ago    Pain Frequency Intermittent    Pain Score 4    Pain Location Knee    Pain Orientation Left;Right    Pain Descriptors / Indicators Aching    Pain Type Acute pain    Pain Onset More than a month ago    Pain Frequency Intermittent                               [facility] Adult PT Treatment/Exercise - 09/12/20 0001       Exercises   Exercises Shoulder;Knee/Hip      Knee/Hip Exercises: Seated   Long Arc Quad 20 reps    Long Arc Quad Weight 3 lbs.      Knee/Hip Exercises: Supine   Quad Sets 10 reps    Straight  Leg Raises 15 reps;Both      Shoulder Exercises: Supine   Protraction --    Protraction Limitations --    Flexion 15 reps;AROM    Flexion Limitations --    Other Supine Exercises BIl ER AROM  at 90 deg x 15;    Other Supine Exercises Shoulder horizontal abd x 10      Shoulder Exercises: Seated   Other Seated Exercises Thoracic rotation x 10 with light overpressure    Other Seated Exercises --      Shoulder Exercises: Prone   Other Prone Exercises locust pose 10 sec x 5;      Shoulder Exercises: Standing   Row 20 reps    Theraband Level (Shoulder Row) Level 3 (Green)    Other Standing Exercises --    Other Standing Exercises bil shoulder ER YTB x 15;      Shoulder Exercises: Pulleys   Flexion 3 minutes  ABduction --      Shoulder Exercises: Stretch   Corner Stretch 3 reps;30 seconds    Corner Stretch Limitations doorway    Other Shoulder Stretches --    Other Shoulder Stretches supine ER shoulder stretch x10;      Manual Therapy   Manual Therapy Joint mobilization;Passive ROM;Soft tissue mobilization    Joint Mobilization GHJ, post and inf mobs gr 3 bil; Thoracic PA mobs;    Passive ROM bil shoulders, all motions                      PT Short Term Goals - 09/03/20 1034       PT SHORT TERM GOAL #1   Title Pt to be independent with initial HEP    Time 2    Period Weeks    Status New    Target Date 09/12/20               PT Long Term Goals - 09/03/20 1035       PT LONG TERM GOAL #1   Title Pt to be independent with final HEP    Time 6    Period Weeks    Status New    Target Date 10/10/20      PT LONG TERM GOAL #2   Title Pt to report decreased pain in bil shoulders to 0-1/10 with elevation , reaching, and lifting activities.    Time 6    Period Weeks    Status New    Target Date 10/10/20      PT LONG TERM GOAL #3   Title Pt to demo improved shoulder ROM to be WNL for elevation, to improve ability for ADLs and IADLs.    Time 6     Period Weeks    Status New    Target Date 10/10/20      PT LONG TERM GOAL #4   Title Pt to demo improved strength of bil knees and hips to at least 4+/5 to improve stability, stair ability, and ability for exercise.    Time 6    Period Weeks    Status New    Target Date 10/10/20                   Plan - 09/12/20 1014     Clinical Impression Statement Pt educated on strengthening exercises for knee pain today, added to HEP. She continues to have stiffness and limtiation for full shoulder flexion, and, and ER. Difficulty and some pain with ER at 90 deg today in supine. Pt improving with ability for ther ex and strengthening for shoulders. Pt to benefit from continued care for improving ROM and pain.    Personal Factors and Comorbidities Time since onset of injury/illness/exacerbation;Comorbidity 1    Comorbidities OP,    Examination-Activity Limitations Reach Overhead;Bathing;Carry;Stand;Lift;Dressing    Examination-Participation Restrictions Meal Prep;Cleaning;Community Activity;Yard Work;Laundry;Shop    Stability/Clinical Decision Making Evolving/Moderate complexity    Rehab Potential Good    PT Frequency 2x / week    PT Duration 6 weeks    PT Treatment/Interventions ADLs/Self Care Home Management;Canalith Repostioning;Electrical Stimulation;Gait training;Ultrasound;Traction;Moist Heat;Iontophoresis 4mg /ml Dexamethasone;Stair training;Functional mobility training;Therapeutic activities;Therapeutic exercise;Balance training;Neuromuscular re-education;Manual techniques;Patient/family education;Passive range of motion;Dry needling;Joint Manipulations;Spinal Manipulations;Vasopneumatic Device;Taping;Cryotherapy;Orthotic Fit/Training;DME Instruction    PT Home Exercise Plan [id number]    Consulted and Agree with Plan of Care Patient             Patient will benefit from skilled therapeutic intervention  in order to improve the following deficits and impairments:  Decreased range of  motion, Pain, Impaired UE functional use, Increased muscle spasms, Decreased activity tolerance, Improper body mechanics, Impaired flexibility, Hypomobility, Decreased mobility, Decreased strength  Visit Diagnosis: Chronic left shoulder pain  Chronic pain of left knee  Chronic pain of right knee  Chronic right shoulder pain  Pain in right elbow     Problem List Patient Active Problem List   Diagnosis Date Noted   Abnormal finding on CT scan 09/04/2020   Aneurysm artery, 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   PMR (polymyalgia rheumatica) (HCC) 02/23/2020   Acute medial meniscal tear, left, initial encounter 02/16/2019   Dizziness 10/26/2018   Loss of transverse plantar arch 10/19/2018   Neuroma of foot 10/19/2018   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

## 2020-09-13 ENCOUNTER — Ambulatory Visit: Payer: Medicare HMO | Admitting: Family Medicine

## 2020-09-17 ENCOUNTER — Ambulatory Visit: Payer: Medicare HMO | Admitting: Physical Therapy

## 2020-09-17 ENCOUNTER — Encounter: Payer: Medicare HMO | Admitting: Physical Therapy

## 2020-09-17 ENCOUNTER — Encounter: Payer: Self-pay | Admitting: Physical Therapy

## 2020-09-17 ENCOUNTER — Ambulatory Visit (INDEPENDENT_AMBULATORY_CARE_PROVIDER_SITE_OTHER)
Admission: RE | Admit: 2020-09-17 | Discharge: 2020-09-17 | Disposition: A | Discharge status: Home / Self Care | Payer: Medicare HMO | Source: Ambulatory Visit | Attending: Internal Medicine | Admitting: Internal Medicine

## 2020-09-17 ENCOUNTER — Other Ambulatory Visit: Payer: Self-pay

## 2020-09-17 DIAGNOSIS — M25511 Pain in right shoulder: Secondary | ICD-10-CM | POA: Diagnosis not present

## 2020-09-17 DIAGNOSIS — M25562 Pain in left knee: Secondary | ICD-10-CM

## 2020-09-17 DIAGNOSIS — J189 Pneumonia, unspecified organism: Secondary | ICD-10-CM | POA: Diagnosis not present

## 2020-09-17 DIAGNOSIS — M25561 Pain in right knee: Secondary | ICD-10-CM

## 2020-09-17 DIAGNOSIS — M25512 Pain in left shoulder: Secondary | ICD-10-CM | POA: Diagnosis not present

## 2020-09-17 DIAGNOSIS — R918 Other nonspecific abnormal finding of lung field: Secondary | ICD-10-CM | POA: Diagnosis not present

## 2020-09-17 DIAGNOSIS — G8929 Other chronic pain: Secondary | ICD-10-CM | POA: Diagnosis not present

## 2020-09-17 DIAGNOSIS — J984 Other disorders of lung: Secondary | ICD-10-CM | POA: Diagnosis not present

## 2020-09-17 DIAGNOSIS — M25521 Pain in right elbow: Secondary | ICD-10-CM | POA: Diagnosis not present

## 2020-09-17 DIAGNOSIS — R9389 Abnormal findings on diagnostic imaging of other specified body structures: Secondary | ICD-10-CM | POA: Diagnosis not present

## 2020-09-17 NOTE — Therapy (Signed)
[facility] [address] [place], [place], [address] Phone: [phone]   Fax:  [phone]  Physical Therapy Treatment  Patient Details  Name: Linda [name] MRN: [id number] Date of Birth: 01/19/1951 Referring Provider (PT): Lynne Leader   Encounter Date: 09/17/2020   PT End of Session - 09/17/20 1531     Visit Number 6    Number of Visits 12    Date for PT Re-Evaluation 10/10/20    Authorization Type [employer]    PT Start Time 1347    PT Stop Time 1430    PT Time Calculation (min) 43 min    Activity Tolerance Patient tolerated treatment well    Behavior During Therapy [facility] for tasks assessed/performed             Past Medical History:  Diagnosis Date   Cellulitis of leg, right 08/27/2016   Hyperlipidemia    Osteopenia    Pneumonia    Vitamin D deficiency     Past Surgical History:  Procedure Laterality Date   COLONOSCOPY  03/2011   Dr Brodie;diminuitive polyp   G 2 P 2     TONSILLECTOMY      There were no vitals filed for this visit.   Subjective Assessment - 09/17/20 1530     Subjective Pt states mild improvements in shoulder pain.    Currently in Pain? Yes    Pain Score 5     Pain Location Shoulder    Pain Orientation Right;Left    Pain Descriptors / Indicators Aching    Pain Type Chronic pain    Pain Onset More than a month ago    Pain Frequency Intermittent                [facility] PT Assessment - 09/17/20 0001       AROM   Right Shoulder Flexion 135 Degrees    Right Shoulder ABduction 115 Degrees    Left Shoulder Flexion 135 Degrees    Left Shoulder ABduction 115 Degrees      PROM   Right Shoulder Flexion 163 Degrees    Left Shoulder Flexion 163 Degrees                           [facility] Adult PT Treatment/Exercise - 09/17/20 0001       Exercises   Exercises Shoulder;Knee/Hip      Knee/Hip Exercises: Stretches   Other Knee/Hip Stretches LTR for spine mobility x10;       Knee/Hip Exercises: Seated   Long Arc Quad --    Long Arc [employer] --      Knee/Hip Exercises: Supine   Quad Sets 10 reps    Straight Leg Raises 15 reps;Both      Shoulder Exercises: Supine   Flexion 15 reps;AROM    Other Supine Exercises --    Other Supine Exercises Shoulder horizontal abd x 10      Shoulder Exercises: Seated   Other Seated Exercises --      Shoulder Exercises: Prone   Other Prone Exercises --      Shoulder Exercises: Standing   Row 20 reps    Theraband Level (Shoulder Row) Level 3 (Green)    Other Standing Exercises Wall angel, modified,  AROM x 10;    Other Standing Exercises bil shoulder ER YTB x 15; Wall push ups x 15;      Shoulder Exercises: Pulleys   Flexion  3 minutes    ABduction 1 minute      Shoulder Exercises: Stretch   Corner Stretch 3 reps;30 seconds    Corner Stretch Limitations doorway    Other Shoulder Stretches supine ER shoulder stretch x10;      Manual Therapy   Manual Therapy Joint mobilization;Passive ROM;Soft tissue mobilization    Joint Mobilization --    Passive ROM bil shoulders, all motions                      PT Short Term Goals - 09/03/20 1034       PT SHORT TERM GOAL #1   Title Pt to be independent with initial HEP    Time 2    Period Weeks    Status New    Target Date 09/12/20               PT Long Term Goals - 09/03/20 1035       PT LONG TERM GOAL #1   Title Pt to be independent with final HEP    Time 6    Period Weeks    Status New    Target Date 10/10/20      PT LONG TERM GOAL #2   Title Pt to report decreased pain in bil shoulders to 0-1/10 with elevation , reaching, and lifting activities.    Time 6    Period Weeks    Status New    Target Date 10/10/20      PT LONG TERM GOAL #3   Title Pt to demo improved shoulder ROM to be WNL for elevation, to improve ability for ADLs and IADLs.    Time 6    Period Weeks    Status New    Target Date 10/10/20      PT LONG TERM GOAL  #4   Title Pt to demo improved strength of bil knees and hips to at least 4+/5 to improve stability, stair ability, and ability for exercise.    Time 6    Period Weeks    Status New    Target Date 10/10/20                   Plan - 09/17/20 1532     Clinical Impression Statement Pt with mild improvments in ROM of Bil shoulders for AROM and PROM. She is improving with ability for strengthening exercises. She continues to have low grade soreness with most elevation and activity with shoulders, as well as first thing in am. Pt to benefit from continued work on thoracic mobility for improving shoulder ROM, as well as strengthening and pain relief.    Personal Factors and Comorbidities Time since onset of injury/illness/exacerbation;Comorbidity 1    Comorbidities OP,    Examination-Activity Limitations Reach Overhead;Bathing;Carry;Stand;Lift;Dressing    Examination-Participation Restrictions Meal Prep;Cleaning;Community Activity;Yard Work;Laundry;Shop    Stability/Clinical Decision Making Evolving/Moderate complexity    Rehab Potential Good    PT Frequency 2x / week    PT Duration 6 weeks    PT Treatment/Interventions ADLs/Self Care Home Management;Canalith Repostioning;Electrical Stimulation;Gait training;Ultrasound;Traction;Moist Heat;Iontophoresis 4mg /ml Dexamethasone;Stair training;Functional mobility training;Therapeutic activities;Therapeutic exercise;Balance training;Neuromuscular re-education;Manual techniques;Patient/family education;Passive range of motion;Dry needling;Joint Manipulations;Spinal Manipulations;Vasopneumatic Device;Taping;Cryotherapy;Orthotic Fit/Training;DME Instruction    PT Home Exercise Plan [id number]    Consulted and Agree with Plan of Care Patient             Patient will benefit from skilled therapeutic intervention in order to improve the following deficits and impairments:  Decreased range of motion, Pain, Impaired UE functional use, Increased muscle  spasms, Decreased activity tolerance, Improper body mechanics, Impaired flexibility, Hypomobility, Decreased mobility, Decreased strength  Visit Diagnosis: Chronic left shoulder pain  Chronic pain of left knee  Chronic pain of right knee  Chronic right shoulder pain  Pain in right elbow     Problem List Patient Active Problem List   Diagnosis Date Noted   Abnormal finding on CT scan 09/04/2020   Aneurysm artery, 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 [place], [place], [address] Phone: [phone]   Fax:  [phone]  Name: BRITISH MOYD MRN: [id number] Date of Birth: 08-22-1950

## 2020-09-19 ENCOUNTER — Ambulatory Visit: Payer: Medicare HMO | Admitting: Physical Therapy

## 2020-09-19 ENCOUNTER — Encounter: Payer: Self-pay | Admitting: Physical Therapy

## 2020-09-19 DIAGNOSIS — M25512 Pain in left shoulder: Secondary | ICD-10-CM

## 2020-09-19 DIAGNOSIS — M25561 Pain in right knee: Secondary | ICD-10-CM | POA: Diagnosis not present

## 2020-09-19 DIAGNOSIS — G8929 Other chronic pain: Secondary | ICD-10-CM | POA: Diagnosis not present

## 2020-09-19 DIAGNOSIS — M25562 Pain in left knee: Secondary | ICD-10-CM

## 2020-09-19 DIAGNOSIS — M25521 Pain in right elbow: Secondary | ICD-10-CM

## 2020-09-19 DIAGNOSIS — M25511 Pain in right shoulder: Secondary | ICD-10-CM

## 2020-09-19 NOTE — Therapy (Signed)
[facility] [address] [place], [place], [address] Phone: [phone]   Fax:  [phone]  Physical Therapy Treatment  Patient Details  Name: Linda [name] MRN: [id number] Date of Birth: 08-22-1950 Referring Provider (PT): Lynne Leader   Encounter Date: 09/19/2020   PT End of Session - 09/19/20 1340     Visit Number 7    Number of Visits 12    Date for PT Re-Evaluation 10/10/20    Authorization Type [employer]    PT Start Time [phone]    PT Stop Time 0930    PT Time Calculation (min) 43 min    Activity Tolerance Patient tolerated treatment well    Behavior During Therapy [facility] for tasks assessed/performed             Past Medical History:  Diagnosis Date   Cellulitis of leg, right 08/27/2016   Hyperlipidemia    Osteopenia    Pneumonia    Vitamin D deficiency     Past Surgical History:  Procedure Laterality Date   COLONOSCOPY  03/2011   Dr Brodie;diminuitive polyp   G 2 P 2     TONSILLECTOMY      There were no vitals filed for this visit.   Subjective Assessment - 09/19/20 1339     Subjective Pt states soreness in shoulders.    Currently in Pain? Yes    Pain Location Shoulder    Pain Orientation Left;Right    Pain Descriptors / Indicators Aching    Pain Type Chronic pain    Pain Onset More than a month ago    Pain Frequency Intermittent                               [facility] Adult PT Treatment/Exercise - 09/19/20 0001       Exercises   Exercises Shoulder;Knee/Hip      Knee/Hip Exercises: Stretches   Other Knee/Hip Stretches --      Shoulder Exercises: Supine   Flexion 15 reps;AROM    Other Supine Exercises Shoulder horizontal abd x 10      Shoulder Exercises: Standing   Row 20 reps    Theraband Level (Shoulder Row) Level 3 (Green)    Other Standing Exercises Wall angel, modified,  AROM x 10; Scaption x 10    Other Standing Exercises bil shoulder ER YTB x 15; Wall push ups x 15;       Shoulder Exercises: Pulleys   Flexion 3 minutes    ABduction --      Shoulder Exercises: Stretch   Corner Stretch 3 reps;30 seconds    Corner Stretch Limitations doorway    Other Shoulder Stretches supine ER shoulder stretch x10 With LTR      Manual Therapy   Manual Therapy Joint mobilization;Passive ROM;Soft tissue mobilization    Joint Mobilization GHJ, post and inf mobs gr 3 bil; LAD,  Thoracic PA mobs;    Passive ROM bil shoulders, all motions                      PT Short Term Goals - 09/19/20 1340       PT SHORT TERM GOAL #1   Title Pt to be independent with initial HEP    Time 2    Period Weeks    Status Achieved    Target Date 09/12/20  PT Long Term Goals - 09/03/20 1035       PT LONG TERM GOAL #1   Title Pt to be independent with final HEP    Time 6    Period Weeks    Status New    Target Date 10/10/20      PT LONG TERM GOAL #2   Title Pt to report decreased pain in bil shoulders to 0-1/10 with elevation , reaching, and lifting activities.    Time 6    Period Weeks    Status New    Target Date 10/10/20      PT LONG TERM GOAL #3   Title Pt to demo improved shoulder ROM to be WNL for elevation, to improve ability for ADLs and IADLs.    Time 6    Period Weeks    Status New    Target Date 10/10/20      PT LONG TERM GOAL #4   Title Pt to demo improved strength of bil knees and hips to at least 4+/5 to improve stability, stair ability, and ability for exercise.    Time 6    Period Weeks    Status New    Target Date 10/10/20                   Plan - 09/19/20 1341     Clinical Impression Statement Pt continues to have ongoing pain in shoulders. She does also have pain in multiple joints. Shoulder ROM and strength improving, but pain not significantly improving. 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 Pt to benefit from continued care for another 1-2 weeks. If pain  not signfiicantly improving, will refer back to MD.    Personal Factors and Comorbidities Time since onset of injury/illness/exacerbation;Comorbidity 1    Comorbidities OP,    Examination-Activity Limitations Reach Overhead;Bathing;Carry;Stand;Lift;Dressing    Examination-Participation Restrictions Meal Prep;Cleaning;Community Activity;Yard Work;Laundry;Shop    Rehab Potential Good    PT Frequency 2x / week    PT Duration 6 weeks    PT Treatment/Interventions ADLs/Self Care Home Management;Canalith Repostioning;Electrical Stimulation;Gait training;Ultrasound;Traction;Moist Heat;Iontophoresis 4mg /ml Dexamethasone;Stair training;Functional mobility training;Therapeutic activities;Therapeutic exercise;Balance training;Neuromuscular re-education;Manual techniques;Patient/family education;Passive range of motion;Dry needling;Joint Manipulations;Spinal Manipulations;Vasopneumatic Device;Taping;Cryotherapy;Orthotic Fit/Training;DME Instruction    PT Home Exercise Plan [id number]             Patient will benefit from skilled therapeutic intervention in order to improve the following deficits and impairments:  Decreased range of motion, Pain, Impaired UE functional use, Increased muscle spasms, Decreased activity tolerance, Improper body mechanics, Impaired flexibility, Hypomobility, Decreased mobility, Decreased strength  Visit Diagnosis: Chronic left shoulder pain  Chronic pain of left knee  Chronic pain of right knee  Pain in right elbow  Chronic right shoulder pain     Problem List Patient Active Problem List   Diagnosis Date Noted   Abnormal finding on CT scan 09/04/2020   Aneurysm artery, neck ([place]) 09/03/2020   PMR (polymyalgia rheumatica) ([place]) 02/23/2020   Acute medial meniscal tear, left, initial encounter 02/16/2019   Dizziness 10/26/2018   Loss of transverse plantar arch 10/19/2018   Neuroma of foot 10/19/2018   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

## 2020-09-25 ENCOUNTER — Encounter: Payer: Self-pay | Admitting: Internal Medicine

## 2020-09-25 ENCOUNTER — Ambulatory Visit: Payer: Medicare HMO | Admitting: Physical Therapy

## 2020-09-25 ENCOUNTER — Other Ambulatory Visit: Payer: Self-pay

## 2020-09-25 ENCOUNTER — Ambulatory Visit: Payer: Medicare HMO | Admitting: Internal Medicine

## 2020-09-25 ENCOUNTER — Encounter: Payer: Self-pay | Admitting: Physical Therapy

## 2020-09-25 VITALS — BP 126/78 | HR 79 | Temp 98.4°F | Ht 64.0 in | Wt 128.6 lb

## 2020-09-25 DIAGNOSIS — J189 Pneumonia, unspecified organism: Secondary | ICD-10-CM

## 2020-09-25 DIAGNOSIS — M25562 Pain in left knee: Secondary | ICD-10-CM

## 2020-09-25 DIAGNOSIS — M25561 Pain in right knee: Secondary | ICD-10-CM | POA: Diagnosis not present

## 2020-09-25 DIAGNOSIS — G8929 Other chronic pain: Secondary | ICD-10-CM | POA: Diagnosis not present

## 2020-09-25 DIAGNOSIS — M25511 Pain in right shoulder: Secondary | ICD-10-CM

## 2020-09-25 DIAGNOSIS — M25512 Pain in left shoulder: Secondary | ICD-10-CM | POA: Diagnosis not present

## 2020-09-25 DIAGNOSIS — M25521 Pain in right elbow: Secondary | ICD-10-CM

## 2020-09-25 NOTE — Therapy (Signed)
Linda [name] [address] [place], [place], [address] Phone: [phone]   Fax:  [phone]  Physical Therapy Treatment  Patient Details  Name: Linda [name] MRN: [id number] Date of Birth: 10-Oct-1950 Referring Provider (PT): Lynne Leader   Encounter Date: 09/25/2020   PT End of Session - 09/25/20 0851     Visit Number 8    Number of Visits 12    Date for PT Re-Evaluation 10/10/20    Authorization Type [employer]    PT Start Time 0802    PT Stop Time 0849    PT Time Calculation (min) 47 min    Activity Tolerance Patient tolerated treatment well    Behavior During Therapy [facility] for tasks assessed/performed             Past Medical History:  Diagnosis Date   Cellulitis of leg, right 08/27/2016   Hyperlipidemia    Osteopenia    Pneumonia    Vitamin D deficiency     Past Surgical History:  Procedure Laterality Date   COLONOSCOPY  03/2011   Dr Brodie;diminuitive polyp   G 2 P 2     TONSILLECTOMY      There were no vitals filed for this visit.   Subjective Assessment - 09/25/20 0906     Subjective Pt thinks pain is getting better, still rates 5/10.    Currently in Pain? Yes    Pain Score 5     Pain Location Shoulder    Pain Orientation Right;Left    Pain Descriptors / Indicators Aching    Pain Type Chronic pain    Pain Onset More than a month ago    Pain Frequency Intermittent                               [facility] Adult PT Treatment/Exercise - 09/25/20 0001       Exercises   Exercises Shoulder;Knee/Hip      Shoulder Exercises: Supine   Protraction 20 reps    Protraction Limitations SA punch with cane    Other Supine Exercises Shoulder horizontal abd x 10      Shoulder Exercises: Standing   Row 20 reps    Theraband Level (Shoulder Row) Level 4 (Blue)    Other Standing Exercises AROM x 10; Scaption x 10    Other Standing Exercises bil shoulder ER YTB x 15;      Shoulder Exercises: Pulleys    Flexion 3 minutes      Shoulder Exercises: Stretch   Corner Stretch 3 reps;30 seconds    Corner Stretch Limitations doorway      Manual Therapy   Manual Therapy Joint mobilization;Passive ROM;Soft tissue mobilization    Joint Mobilization GHJ, post and inf mobs gr 3 bil; LAD,  Thoracic PA mobs; scapular mobs R; Cervical PA mobs, cervical distraction    Passive ROM bil shoulders, all motions                      PT Short Term Goals - 09/19/20 1340       PT SHORT TERM GOAL #1   Title Pt to be independent with initial HEP    Time 2    Period Weeks    Status Achieved    Target Date 09/12/20               PT Long Term Goals - 09/03/20 1035  PT LONG TERM GOAL #1   Title Pt to be independent with final HEP    Time 6    Period Weeks    Status New    Target Date 10/10/20      PT LONG TERM GOAL #2   Title Pt to report decreased pain in bil shoulders to 0-1/10 with elevation , reaching, and lifting activities.    Time 6    Period Weeks    Status New    Target Date 10/10/20      PT LONG TERM GOAL #3   Title Pt to demo improved shoulder ROM to be WNL for elevation, to improve ability for ADLs and IADLs.    Time 6    Period Weeks    Status New    Target Date 10/10/20      PT LONG TERM GOAL #4   Title Pt to demo improved strength of bil knees and hips to at least 4+/5 to improve stability, stair ability, and ability for exercise.    Time 6    Period Weeks    Status New    Target Date 10/10/20                   Plan - 09/25/20 1207     Clinical Impression Statement Practiced comfortable level of elevation today, pt with good ability for elevation/scaption up to her limit. Discussed not pushing too far for elevation if painful. Continued mobilization for t-spine, c-spine and shoulders, for improving shoulder ROM. Ther ex done for scapular mobility and strengthening as well. Pt states mild decrease in pain levels, but continues to have pain  daily.    Personal Factors and Comorbidities Time since onset of injury/illness/exacerbation;Comorbidity 1    Comorbidities OP,    Examination-Activity Limitations Reach Overhead;Bathing;Carry;Stand;Lift;Dressing    Examination-Participation Restrictions Meal Prep;Cleaning;Community Activity;Yard Work;Laundry;Shop    Rehab Potential Good    PT Frequency 2x / week    PT Duration 6 weeks    PT Treatment/Interventions ADLs/Self Care Home Management;Canalith Repostioning;Electrical Stimulation;Gait training;Ultrasound;Traction;Moist Heat;Iontophoresis 4mg /ml Dexamethasone;Stair training;Functional mobility training;Therapeutic activities;Therapeutic exercise;Balance training;Neuromuscular re-education;Manual techniques;Patient/family education;Passive range of motion;Dry needling;Joint Manipulations;Spinal Manipulations;Vasopneumatic Device;Taping;Cryotherapy;Orthotic Fit/Training;DME Instruction    PT Home Exercise Plan [id number]             Patient will benefit from skilled therapeutic intervention in order to improve the following deficits and impairments:  Decreased range of motion, Pain, Impaired UE functional use, Increased muscle spasms, Decreased activity tolerance, Improper body mechanics, Impaired flexibility, Hypomobility, Decreased mobility, Decreased strength  Visit Diagnosis: Chronic left shoulder pain  Chronic pain of left knee  Chronic pain of right knee  Pain in right elbow  Chronic right shoulder pain     Problem List Patient Active Problem List   Diagnosis Date Noted   Abnormal finding on CT scan 09/04/2020   Aneurysm artery, neck ([facility]) 09/03/2020   PMR (polymyalgia rheumatica) ([facility]) 02/23/2020   Acute medial meniscal tear, left, initial encounter 02/16/2019   Dizziness 10/26/2018   Loss of transverse plantar arch 10/19/2018   Neuroma of foot 10/19/2018   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

## 2020-09-25 NOTE — Progress Notes (Signed)
Linda [name]    [id number]    Aug 23, 1950  Primary Care Physician:Burns, Claudina Lick, MD Date of Appointment: 09/25/2020 Established Patient Visit  Chief complaint:   Chief Complaint  Patient presents with   Follow-up    No c/o       HPI: Linda [name] is a 70 y.o. woman with history of recent CAP and abnormal CT Chest.   Interval Updates: Here for follow up after ct scan to ensures resolution.  No more symptoms of chest tightness, shortness of breath, cough, fevers, weight loss.   I have reviewed the patient's family social and past medical history and updated as appropriate.   Past Medical History:  Diagnosis Date   Cellulitis of leg, right 08/27/2016   Hyperlipidemia    Osteopenia    Pneumonia    Vitamin D deficiency     Past Surgical History:  Procedure Laterality Date   COLONOSCOPY  03/2011   Dr Brodie;diminuitive polyp   G 2 P 2     TONSILLECTOMY      Family History  Problem Relation Age of Onset   Melanoma Father    Hyperlipidemia Mother    Heart disease Mother        no MI   Diabetes Paternal Grandmother        vision loss   Diabetes Maternal Uncle    Colon cancer Neg Hx    Stomach cancer Neg Hx    Esophageal cancer Neg Hx    Rectal cancer Neg Hx    Stroke Neg Hx     Social History   Occupational History   Occupation: Science writer  Tobacco Use   Smoking status: Never   Smokeless tobacco: Never  Vaping Use   Vaping Use: Never used  Substance and Sexual Activity   Alcohol use: Yes    Comment:  1-2 wine or beer nightly   Drug use: No   Sexual activity: Not on file     Physical Exam: Blood pressure 126/78, pulse 79, temperature 98.4 F (36.9 C), temperature source Oral, height 5\' 4"  (1.626 m), weight 128 lb 9.6 oz (58.3 kg), SpO2 96 %.  Gen:      No acute distress ENT:  no nasal polyps, mucus membranes moist Lungs:    No increased respiratory effort, symmetric chest wall excursion, clear to auscultation bilaterally, no  wheezes or crackles CV:         Regular rate and rhythm; no murmurs, rubs, or gallops.  No pedal edema   Data Reviewed: Imaging: I have personally reviewed the CT Chest June 2022 when compared to scan 1 month prior. Dramatic improvement of RUL pneumonia. Some residual RML tree in bud changes and bronchiectasis. Resolving infection likely  PFTs: No flowsheet data found.   Labs:  Immunization status: Immunization History  Administered Date(s) Administered   Fluad Quad(high Dose 65+) 11/18/2018   Influenza, High Dose Seasonal PF 04/30/2016, 12/06/2016, 12/15/2017, 12/29/2019   Influenza,inj,Quad PF,6+ Mos 01/19/2015   Influenza-Unspecified 12/22/2013, 12/06/2016, 11/17/2019   PFIZER(Purple Top)SARS-COV-2 Vaccination 04/12/2019, 05/02/2019, 12/29/2019   Pneumococcal Conjugate-13 10/17/2015   Pneumococcal Polysaccharide-23 01/27/2017   Td 11/22/2004   Tdap 10/03/2015   Zoster, Live 10/03/2015    Assessment:  Community Acquired Pneumonia, Right upper lobe Abnormal CT Chest  Plan/Recommendations: Linda [name] has some residual RML changes which could be resolving infection. However could also be suspicious for atypical infection such as NTM. She is asymptomatic without signs of persistent  NTM infection. I recommend we follow up in a year to monitor for symptoms of respiratory disease - sooner if symptomatic. If she has recurrent pneumonia or worsening respiratory symptoms would repeat CT Chest and consider bronchoscopy for evaluation of NTM if no sputum production. She is in agreement with this place.  Her RUL pneumonia is essentially resolved.    Return to Care: Return in about 1 year (around 09/25/2021).   Lenice Llamas, MD Pulmonary and [facility]

## 2020-09-25 NOTE — Patient Instructions (Signed)
The patient should have follow up scheduled with myself in 1 year.   Please call me if you develop any breathing problems or pneumonia before then.

## 2020-09-27 ENCOUNTER — Encounter: Payer: Medicare HMO | Admitting: Physical Therapy

## 2020-10-05 NOTE — Progress Notes (Signed)
   I, Peterson Lombard, LAT, ATC acting as a scribe for Lynne Leader, MD.  Linda [name] is a 70 y.o. female who presents to [facility] today for f/u bilat shoulder and elbow pain thought to be due to subacromial bursitis/rotator cuff tendinopathy, and periscapular dysfunction and lateral epicondylitis. Pt was last seen by Dr. Georgina Snell on 08/27/20 and was referred to PT of which she's completed 8 visits. Today, pt reports that she has has noticed some improvement with physical therapy. Would like to continue PT if possible. Pain with flexion and ER. Patient complains of polyarthralgia with movement at all joints and when trying to go to sleep at night.  Dx testing: 08/27/20 R & L shoulder XR  08/27/20 Labs (sed rate & C-reactive protein)  Pertinent review of systems: No fevers or chills  Relevant historical information: polymyalgia rheumatica in remission off of steroids   Exam:  BP 110/68   Pulse 78   Ht 5\' 4"  (1.626 m)   Wt 128 lb (58.1 kg)   SpO2 97%   BMI 21.97 kg/m  General: Well Developed, well nourished, and in no acute distress.   MSK: Shoulders bilaterally normal.  Normal motion.  Intact strength.    Lab and Radiology Results    EXAM: LEFT SHOULDER - 2+ VIEW   COMPARISON:  None.   FINDINGS: There is no evidence of acute fracture. There is mild glenohumeral and AC joint degenerative change.   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     Electronically Signed   By: Maurine Simmering   On: 08/28/2020 13:05  EXAM: RIGHT SHOULDER - 2+ VIEW   COMPARISON:  None.   FINDINGS: There is no evidence of acute fracture. There is mild glenohumeral and AC joint degenerative change. Mild greater tuberosity irregularity.   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     Electronically Signed   By: Maurine Simmering   On: 08/28/2020 13:04  I, Lynne Leader, personally (independently) visualized and  performed the interpretation of the images attached in this note.  Lab Results  Component Value Date   ESRSEDRATE 14 08/27/2020     Assessment and Plan: 70 y.o. female with bilateral shoulder pain associated with multiple joint arthralgia.  Doing well with physical therapy but still has room for improvement.  Recommend continued PT as as long as both the patient and physical therapy feel that she is improving.  Recommend transitioning to less frequent visits probably weekly or every other week with a transition to home exercise program in the near future.  Recheck with me in 2 months.  Certainly could proceed to injection any of these joints if needed however would like to avoid using steroids if possible.  Based on review of rheumatology note from mid June and review of recent sed rate and CRP polymyalgia rheumatica seems to be doing quite well and is in remission at this time.  Agree staying off of systemic steroids for now.    Discussed warning signs or symptoms. Please see discharge instructions. Patient expresses understanding.   The above documentation has been reviewed and is accurate and complete Lynne Leader, M.D.

## 2020-10-08 ENCOUNTER — Encounter: Payer: Self-pay | Admitting: Family Medicine

## 2020-10-08 ENCOUNTER — Other Ambulatory Visit: Payer: Self-pay

## 2020-10-08 ENCOUNTER — Ambulatory Visit (INDEPENDENT_AMBULATORY_CARE_PROVIDER_SITE_OTHER): Payer: Medicare HMO | Admitting: Family Medicine

## 2020-10-08 VITALS — BP 110/68 | HR 78 | Ht 64.0 in | Wt 128.0 lb

## 2020-10-08 DIAGNOSIS — M25521 Pain in right elbow: Secondary | ICD-10-CM

## 2020-10-08 DIAGNOSIS — M25561 Pain in right knee: Secondary | ICD-10-CM

## 2020-10-08 DIAGNOSIS — M25512 Pain in left shoulder: Secondary | ICD-10-CM | POA: Diagnosis not present

## 2020-10-08 DIAGNOSIS — M25522 Pain in left elbow: Secondary | ICD-10-CM

## 2020-10-08 DIAGNOSIS — G8929 Other chronic pain: Secondary | ICD-10-CM

## 2020-10-08 DIAGNOSIS — M25562 Pain in left knee: Secondary | ICD-10-CM

## 2020-10-08 DIAGNOSIS — M25511 Pain in right shoulder: Secondary | ICD-10-CM

## 2020-10-08 DIAGNOSIS — M353 Polymyalgia rheumatica: Secondary | ICD-10-CM | POA: Diagnosis not present

## 2020-10-08 NOTE — Patient Instructions (Signed)
Thank you for coming in today.   Continue home exercises and PT.   Recheck in 2 months.   If not improving r if worsening I can do injections or more work up.   Ok to recheck or contact me sooner if needed.   Please let Lauren know you would like more PT.

## 2020-10-09 ENCOUNTER — Ambulatory Visit: Payer: Medicare HMO | Admitting: Physical Therapy

## 2020-10-09 ENCOUNTER — Encounter: Payer: Self-pay | Admitting: Physical Therapy

## 2020-10-09 DIAGNOSIS — M25521 Pain in right elbow: Secondary | ICD-10-CM | POA: Diagnosis not present

## 2020-10-09 DIAGNOSIS — M25561 Pain in right knee: Secondary | ICD-10-CM

## 2020-10-09 DIAGNOSIS — M25511 Pain in right shoulder: Secondary | ICD-10-CM

## 2020-10-09 DIAGNOSIS — G8929 Other chronic pain: Secondary | ICD-10-CM | POA: Diagnosis not present

## 2020-10-09 DIAGNOSIS — M25562 Pain in left knee: Secondary | ICD-10-CM

## 2020-10-09 DIAGNOSIS — M25512 Pain in left shoulder: Secondary | ICD-10-CM | POA: Diagnosis not present

## 2020-10-09 NOTE — Therapy (Signed)
[facility] [address] [place], [place], [address] Phone: [phone]   Fax:  [phone]  Physical Therapy Treatment/Re-cert   Patient Details  Name: Linda [name] MRN: [id number] Date of Birth: 1950-07-04 Referring Provider (PT): Lynne Leader   Encounter Date: 10/09/2020   PT End of Session - 10/09/20 1429     Visit Number 9    Number of Visits 18    Date for PT Re-Evaluation 11/06/20    Authorization Type [employer]    PT Start Time 1425    PT Stop Time 1505    PT Time Calculation (min) 40 min    Activity Tolerance Patient tolerated treatment well    Behavior During Therapy [facility] for tasks assessed/performed             Past Medical History:  Diagnosis Date   Cellulitis of leg, right 08/27/2016   Hyperlipidemia    Osteopenia    Pneumonia    Vitamin D deficiency     Past Surgical History:  Procedure Laterality Date   COLONOSCOPY  03/2011   Dr Brodie;diminuitive polyp   G 2 P 2     TONSILLECTOMY      There were no vitals filed for this visit.   Subjective Assessment - 10/09/20 1429     Subjective Pt states she thinks she is slowly doing better. Knees have not been bothersome. Elbow also improving.    Currently in Pain? Yes    Pain Score 4     Pain Location Shoulder    Pain Orientation Right;Left    Pain Descriptors / Indicators Aching    Pain Type Chronic pain    Pain Onset More than a month ago    Pain Frequency Intermittent                [facility] PT Assessment - 10/09/20 0001       AROM   Right Shoulder Flexion 135 Degrees    Right Shoulder ABduction 120 Degrees    Left Shoulder Flexion 135 Degrees    Left Shoulder ABduction 125 Degrees      Strength   Overall Strength Comments L shoulder: flex: 4+5, abd: 4/5, IR/ER: 4/5,  R shoulder: flex: 4+/5, abd: 4/5, IR/ER: 4+/5.                           [facility] Adult PT Treatment/Exercise - 10/09/20 0001       Exercises   Exercises  Shoulder;Knee/Hip      Shoulder Exercises: Supine   Protraction 20 reps    Protraction Limitations SA punch 2 lb    Horizontal ABduction 15 reps;AROM    Flexion AROM;15 reps    Flexion Limitations with ta, neutral spine    Other Supine Exercises --      Shoulder Exercises: Seated   Other Seated Exercises Thoracic rotation x 10 with light overpressure      Shoulder Exercises: Standing   Row 20 reps    Theraband Level (Shoulder Row) Level 4 (Blue)    Other Standing Exercises AROM x 10; Scaption x 10    Other Standing Exercises bil shoulder ER YTB x 15;      Shoulder Exercises: Pulleys   Flexion --      Shoulder Exercises: ROM/Strengthening   UBE (Upper Arm Bike) L1 x 4 min      Shoulder Exercises: Stretch   Corner Stretch 3 reps;30 seconds    Corner  Stretch Limitations doorway      Manual Therapy   Manual Therapy Joint mobilization;Passive ROM;Soft tissue mobilization    Joint Mobilization GHJ, post and inf mobs gr 3 bil; LAD,    Passive ROM bil shoulders, all motions                    PT Education - 10/09/20 1509     Education Details Reviewed HEP, POC    Person(s) Educated Patient    Methods Explanation;Demonstration;Tactile cues;Verbal cues;Handout    Comprehension Verbalized understanding;Returned demonstration;Verbal cues required;Tactile cues required;Need further instruction              PT Short Term Goals - 09/19/20 1340       PT SHORT TERM GOAL #1   Title Pt to be independent with initial HEP    Time 2    Period Weeks    Status Achieved    Target Date 09/12/20               PT Long Term Goals - 10/09/20 1510       PT LONG TERM GOAL #1   Title Pt to be independent with final HEP    Time 6    Period Weeks    Status Partially Met    Target Date 11/06/20      PT LONG TERM GOAL #2   Title Pt to report decreased pain in bil shoulders to 0-1/10 with elevation , reaching, and lifting activities.    Time 6    Period Weeks     Status On-going    Target Date 11/06/20      PT LONG TERM GOAL #3   Title Pt to demo improved shoulder ROM to be WNL for elevation, to improve ability for ADLs and IADLs.    Time 6    Period Weeks    Status Partially Met    Target Date 11/06/20      PT LONG TERM GOAL #4   Title Pt to demo improved strength of bil knees and hips to at least 4+/5 to improve stability, stair ability, and ability for exercise.    Time 6    Period Weeks    Status Partially Met    Target Date 11/06/20                   Plan - 10/09/20 1511     Clinical Impression Statement Pt making improvements, but has been slow. She does report decreased pain levels in shoulders, knees and elbow. Shoulders has been main focus of treatment. She has improved ROM measurements, and improving strength. She still has difficulty with ability for full shoulder ROM on R, at end range for flexion as well as behind the back IR. She also has stiffness in t-spine that may be contributing. Focus today on controlling core and thoracic motion, and keeping neutral spine with shoulder ROM. Pt progressing with abilityfor strengthening exercises, and functional activity. Pt to benefit from skilled care, at decreased frequency to 1x/wk, for a few more weeks, to improve strength, and finalize HEP.    Personal Factors and Comorbidities Time since onset of injury/illness/exacerbation;Comorbidity 1    Comorbidities OP,    Examination-Activity Limitations Reach Overhead;Bathing;Carry;Stand;Lift;Dressing    Examination-Participation Restrictions Meal Prep;Cleaning;Community Activity;Yard Work;Laundry;Shop    Rehab Potential Good    PT Frequency 2x / week    PT Duration 6 weeks    PT Treatment/Interventions ADLs/Self Care Home Management;Canalith Repostioning;Electrical Stimulation;Gait training;Ultrasound;Traction;Moist Heat;Iontophoresis  67m/ml Dexamethasone;Stair training;Functional mobility training;Therapeutic activities;Therapeutic  exercise;Balance training;Neuromuscular re-education;Manual techniques;Patient/family education;Passive range of motion;Dry needling;Joint Manipulations;Spinal Manipulations;Vasopneumatic Device;Taping;Cryotherapy;Orthotic Fit/Training;DME Instruction    PT Home Exercise Plan [id number]             Patient will benefit from skilled therapeutic intervention in order to improve the following deficits and impairments:  Decreased range of motion, Pain, Impaired UE functional use, Increased muscle spasms, Decreased activity tolerance, Improper body mechanics, Impaired flexibility, Hypomobility, Decreased mobility, Decreased strength  Visit Diagnosis: Chronic left shoulder pain  Chronic pain of left knee  Chronic pain of right knee  Pain in right elbow  Chronic right shoulder pain     Problem List Patient Active Problem List   Diagnosis Date Noted   Abnormal finding on CT scan 09/04/2020   [place] 09/03/2020   PMR (polymyalgia rheumatica) ([facility]) 02/23/2020   Acute medial meniscal tear, left, initial encounter 02/16/2019   Dizziness 10/26/2018   Loss of transverse plantar arch 10/19/2018   Neuroma of foot 10/19/2018   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

## 2020-10-11 ENCOUNTER — Ambulatory Visit: Payer: Medicare HMO | Admitting: Physical Therapy

## 2020-10-11 ENCOUNTER — Encounter: Payer: Self-pay | Admitting: Physical Therapy

## 2020-10-11 DIAGNOSIS — M25511 Pain in right shoulder: Secondary | ICD-10-CM | POA: Diagnosis not present

## 2020-10-11 DIAGNOSIS — G8929 Other chronic pain: Secondary | ICD-10-CM | POA: Diagnosis not present

## 2020-10-11 DIAGNOSIS — M25512 Pain in left shoulder: Secondary | ICD-10-CM

## 2020-10-11 DIAGNOSIS — M25561 Pain in right knee: Secondary | ICD-10-CM | POA: Diagnosis not present

## 2020-10-11 DIAGNOSIS — M25521 Pain in right elbow: Secondary | ICD-10-CM

## 2020-10-11 DIAGNOSIS — M25562 Pain in left knee: Secondary | ICD-10-CM | POA: Diagnosis not present

## 2020-10-11 NOTE — Therapy (Signed)
[facility] [address] [place], [place], [address] Phone: [phone]   Fax:  [phone]  Physical Therapy Treatment  Patient Details  Name: Linda [name] MRN: [id number] Date of Birth: February 13, 1951 Referring Provider (PT): Lynne Leader   Encounter Date: 10/11/2020   PT End of Session - 10/11/20 1511     Visit Number 10    Number of Visits 18    Date for PT Re-Evaluation 11/06/20    Authorization Type [employer]    recert visit 9    PT Start Time 1430    PT Stop Time 1509    PT Time Calculation (min) 39 min    Activity Tolerance Patient tolerated treatment well    Behavior During Therapy [facility] for tasks assessed/performed             Past Medical History:  Diagnosis Date   Cellulitis of leg, right 08/27/2016   Hyperlipidemia    Osteopenia    Pneumonia    Vitamin D deficiency     Past Surgical History:  Procedure Laterality Date   COLONOSCOPY  03/2011   Dr Brodie;diminuitive polyp   G 2 P 2     TONSILLECTOMY      There were no vitals filed for this visit.   Subjective Assessment - 10/11/20 1437     Subjective Pt took some tylenol today, so is feeling less sore.    Currently in Pain? Yes    Pain Score 4     Pain Location Shoulder    Pain Orientation Left;Right    Pain Descriptors / Indicators Aching    Pain Type Chronic pain    Pain Onset More than a month ago    Pain Frequency Intermittent                               [facility] Adult PT Treatment/Exercise - 10/11/20 0001       Exercises   Exercises Shoulder;Knee/Hip      Shoulder Exercises: Supine   Protraction 20 reps    Protraction Limitations SA punch 4 lb    Horizontal ABduction 15 reps;AROM    Flexion AROM;15 reps    Flexion Limitations with ta, neutral spine, 2 lb      Shoulder Exercises: Seated   Other Seated Exercises Thoracic rotation x 10 with light overpressure      Shoulder Exercises: Standing   Row 20 reps     Theraband Level (Shoulder Row) Level 4 (Blue)    Other Standing Exercises AROM x 10 abduction,  flexion x 10    Other Standing Exercises bil shoulder ER RTB x 15;      Shoulder Exercises: ROM/Strengthening   UBE (Upper Arm Bike) L1 x 4 min      Shoulder Exercises: Stretch   Corner Stretch 3 reps;30 seconds    Corner Stretch Limitations doorway    Other Shoulder Stretches ER butterfly stretch in supine x 15;      Manual Therapy   Manual Therapy Joint mobilization;Passive ROM;Soft tissue mobilization    Joint Mobilization GHJ, post and inf mobs gr 3 bil; LAD, Thoracic mobilizations PAs    Passive ROM bil shoulders, all motions                      PT Short Term Goals - 09/19/20 1340       PT SHORT TERM GOAL #1   Title  Pt to be independent with initial HEP    Time 2    Period Weeks    Status Achieved    Target Date 09/12/20               PT Long Term Goals - 10/09/20 1510       PT LONG TERM GOAL #1   Title Pt to be independent with final HEP    Time 6    Period Weeks    Status Partially Met    Target Date 11/06/20      PT LONG TERM GOAL #2   Title Pt to report decreased pain in bil shoulders to 0-1/10 with elevation , reaching, and lifting activities.    Time 6    Period Weeks    Status On-going    Target Date 11/06/20      PT LONG TERM GOAL #3   Title Pt to demo improved shoulder ROM to be WNL for elevation, to improve ability for ADLs and IADLs.    Time 6    Period Weeks    Status Partially Met    Target Date 11/06/20      PT LONG TERM GOAL #4   Title Pt to demo improved strength of bil knees and hips to at least 4+/5 to improve stability, stair ability, and ability for exercise.    Time 6    Period Weeks    Status Partially Met    Target Date 11/06/20                   Plan - 10/11/20 1514     Clinical Impression Statement Pt with mild improvments in R shoulder PROM with flexion and rotation. Doing better with control of  thoracic spine with shoulder elevation and ROM, will benefit from continued cuing on this. Pt with much stiffness in thoracic as well as lumbar spine with PAs. Pt to benefit from continued care.    Personal Factors and Comorbidities Time since onset of injury/illness/exacerbation;Comorbidity 1    Comorbidities OP,    Examination-Activity Limitations Reach Overhead;Bathing;Carry;Stand;Lift;Dressing    Examination-Participation Restrictions Meal Prep;Cleaning;Community Activity;Yard Work;Laundry;Shop    Rehab Potential Good    PT Frequency 2x / week    PT Duration 6 weeks    PT Treatment/Interventions ADLs/Self Care Home Management;Canalith Repostioning;Electrical Stimulation;Gait training;Ultrasound;Traction;Moist Heat;Iontophoresis 108m/ml Dexamethasone;Stair training;Functional mobility training;Therapeutic activities;Therapeutic exercise;Balance training;Neuromuscular re-education;Manual techniques;Patient/family education;Passive range of motion;Dry needling;Joint Manipulations;Spinal Manipulations;Vasopneumatic Device;Taping;Cryotherapy;Orthotic Fit/Training;DME Instruction    PT Home Exercise Plan [id number]             Patient will benefit from skilled therapeutic intervention in order to improve the following deficits and impairments:  Decreased range of motion, Pain, Impaired UE functional use, Increased muscle spasms, Decreased activity tolerance, Improper body mechanics, Impaired flexibility, Hypomobility, Decreased mobility, Decreased strength  Visit Diagnosis: Chronic left shoulder pain  Chronic pain of left knee  Chronic pain of right knee  Pain in right elbow  Chronic right shoulder pain     Problem List Patient Active Problem List   Diagnosis Date Noted   Abnormal finding on CT scan 09/04/2020   Aneurysm artery, neck ([facility] 09/03/2020   PMR (polymyalgia rheumatica) ([facility] 02/23/2020   Acute medial meniscal tear, left, initial encounter 02/16/2019   Dizziness  10/26/2018   Loss of transverse plantar arch 10/19/2018   Neuroma of foot 10/19/2018   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   SI (sacroiliac) joint dysfunction 12/16/2017   Upper airway cough syndrome 11/17/2017  History of colonic polyps 05/22/2014   Hyperglycemia 09/04/2013   Allergic rhinitis 08/06/2013   Generalized anxiety disorder 11/29/2009   Vitamin D deficiency 04/10/2008   Osteoporosis 10/04/2007  Lyndee Hensen, PT, DPT 3:16 PM  10/11/20    [facility] [place], [place], [address] Phone: [phone]   Fax:  [phone]  Name: Linda [name] MRN: [id number] Date of Birth: 1950-08-19

## 2020-10-12 ENCOUNTER — Encounter: Payer: Self-pay | Admitting: Internal Medicine

## 2020-10-15 ENCOUNTER — Encounter: Payer: Self-pay | Admitting: Internal Medicine

## 2020-10-16 DIAGNOSIS — Z20822 Contact with and (suspected) exposure to covid-19: Secondary | ICD-10-CM | POA: Diagnosis not present

## 2020-10-18 ENCOUNTER — Ambulatory Visit: Payer: Medicare HMO | Admitting: Physical Therapy

## 2020-10-18 ENCOUNTER — Encounter: Payer: Self-pay | Admitting: Physical Therapy

## 2020-10-18 DIAGNOSIS — M25511 Pain in right shoulder: Secondary | ICD-10-CM

## 2020-10-18 DIAGNOSIS — M25562 Pain in left knee: Secondary | ICD-10-CM | POA: Diagnosis not present

## 2020-10-18 DIAGNOSIS — M25512 Pain in left shoulder: Secondary | ICD-10-CM | POA: Diagnosis not present

## 2020-10-18 DIAGNOSIS — M25561 Pain in right knee: Secondary | ICD-10-CM | POA: Diagnosis not present

## 2020-10-18 DIAGNOSIS — G8929 Other chronic pain: Secondary | ICD-10-CM

## 2020-10-18 DIAGNOSIS — M25521 Pain in right elbow: Secondary | ICD-10-CM | POA: Diagnosis not present

## 2020-10-18 NOTE — Therapy (Addendum)
[facility] [address] [place], [place], [address] Phone: [phone]   Fax:  [phone]  Physical Therapy Treatment  Patient Details  Name: Linda [name] MRN: [id number] Date of Birth: Aug 21, 1950 Referring Provider (PT): Lynne Leader   Encounter Date: 10/18/2020   PT End of Session - 10/18/20 1109     Visit Number 11    Number of Visits 18    Date for PT Re-Evaluation 11/06/20    Authorization Type [employer]    recert visit 9    PT Start Time 1103    PT Stop Time 1145    PT Time Calculation (min) 42 min    Activity Tolerance Patient tolerated treatment well    Behavior During Therapy [facility] for tasks assessed/performed             Past Medical History:  Diagnosis Date   Cellulitis of leg, right 08/27/2016   Hyperlipidemia    Osteopenia    Pneumonia    Vitamin D deficiency     Past Surgical History:  Procedure Laterality Date   COLONOSCOPY  03/2011   Dr Brodie;diminuitive polyp   G 2 P 2     TONSILLECTOMY      There were no vitals filed for this visit.   Subjective Assessment - 10/18/20 1108     Subjective Pt states slow improvments, but does think shoulders are feeling a little better.    Currently in Pain? Yes                               [facility] Adult PT Treatment/Exercise - 10/18/20 0001       Exercises   Exercises Shoulder;Knee/Hip      Shoulder Exercises: Supine   Horizontal ABduction 15 reps;AROM    Horizontal ABduction Weight (lbs) 2    Flexion AROM;15 reps    Flexion Limitations with ta, neutral spine, 2 lb      Shoulder Exercises: Prone   Other Prone Exercises cobra pose: education on arm positioning and mechanics for yoga class.      Shoulder Exercises: Standing   Row 20 reps    Theraband Level (Shoulder Row) Level 4 (Blue)    Row Limitations low row    Other Standing Exercises AROM x 10 abduction,  flexion x 10; Wall push ups x 15    Other Standing Exercises bil  shoulder ER RTB x 15;      Shoulder Exercises: ROM/Strengthening   UBE (Upper Arm Bike) L1 x 4 min      Shoulder Exercises: Stretch   Corner Stretch 3 reps;30 seconds    Corner Stretch Limitations doorway    Other Shoulder Stretches ER butterfly stretch in supine x 15;      Manual Therapy   Manual Therapy Joint mobilization;Passive ROM;Soft tissue mobilization    Joint Mobilization GHJ, post and inf mobs gr 3 bil; LAD, Thoracic mobilizations PAs    Passive ROM bil shoulders, all motions                      PT Short Term Goals - 09/19/20 1340       PT SHORT TERM GOAL #1   Title Pt to be independent with initial HEP    Time 2    Period Weeks    Status Achieved    Target Date 09/12/20  PT Long Term Goals - 10/09/20 1510       PT LONG TERM GOAL #1   Title Pt to be independent with final HEP    Time 6    Period Weeks    Status Partially Met    Target Date 11/06/20      PT LONG TERM GOAL #2   Title Pt to report decreased pain in bil shoulders to 0-1/10 with elevation , reaching, and lifting activities.    Time 6    Period Weeks    Status On-going    Target Date 11/06/20      PT LONG TERM GOAL #3   Title Pt to demo improved shoulder ROM to be WNL for elevation, to improve ability for ADLs and IADLs.    Time 6    Period Weeks    Status Partially Met    Target Date 11/06/20      PT LONG TERM GOAL #4   Title Pt to demo improved strength of bil knees and hips to at least 4+/5 to improve stability, stair ability, and ability for exercise.    Time 6    Period Weeks    Status Partially Met    Target Date 11/06/20                   Plan - 10/18/20 1154     Clinical Impression Statement Pt with improvments in shoulder mobility, progression has been slow, but pt with improved motion for elevation , ER and behind the back IR. Still has mild IR limitations, as well as pain with movement. Pt progressing well with strengthening. Plan  to see pt for 1 more visit next week, and d/c to HEP. Will review final HEP then.    Personal Factors and Comorbidities Time since onset of injury/illness/exacerbation;Comorbidity 1    Comorbidities OP,    Examination-Activity Limitations Reach Overhead;Bathing;Carry;Stand;Lift;Dressing    Examination-Participation Restrictions Meal Prep;Cleaning;Community Activity;Yard Work;Laundry;Shop    Rehab Potential Good    PT Frequency 2x / week    PT Duration 6 weeks    PT Treatment/Interventions ADLs/Self Care Home Management;Canalith Repostioning;Electrical Stimulation;Gait training;Ultrasound;Traction;Moist Heat;Iontophoresis 86m/ml Dexamethasone;Stair training;Functional mobility training;Therapeutic activities;Therapeutic exercise;Balance training;Neuromuscular re-education;Manual techniques;Patient/family education;Passive range of motion;Dry needling;Joint Manipulations;Spinal Manipulations;Vasopneumatic Device;Taping;Cryotherapy;Orthotic Fit/Training;DME Instruction    PT Home Exercise Plan [id number]             Patient will benefit from skilled therapeutic intervention in order to improve the following deficits and impairments:  Decreased range of motion, Pain, Impaired UE functional use, Increased muscle spasms, Decreased activity tolerance, Improper body mechanics, Impaired flexibility, Hypomobility, Decreased mobility, Decreased strength  Visit Diagnosis: Chronic left shoulder pain  Chronic pain of left knee  Chronic pain of right knee  Pain in right elbow  Chronic right shoulder pain     Problem List Patient Active Problem List   Diagnosis Date Noted   Abnormal finding on CT scan 09/04/2020   Aneurysm artery, neck ([facility] 09/03/2020   PMR (polymyalgia rheumatica) ([facility] 02/23/2020   Acute medial meniscal tear, left, initial encounter 02/16/2019   Dizziness 10/26/2018   Loss of transverse plantar arch 10/19/2018   Neuroma of foot 10/19/2018   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    SI (sacroiliac) joint dysfunction 12/16/2017   Upper airway cough syndrome 11/17/2017   History of colonic polyps 05/22/2014   Hyperglycemia 09/04/2013   Allergic rhinitis 08/06/2013   Generalized anxiety disorder 11/29/2009   Vitamin D deficiency 04/10/2008   Osteoporosis 10/04/2007   LAnder Purpura  Kayleen Memos, PT, DPT 11:55 AM  10/18/20    [facility] [place], [place], [address] Phone: [phone]   Fax:  [phone]  Name: Linda [name] MRN: [id number] Date of Birth: 07-13-1950   PHYSICAL THERAPY DISCHARGE SUMMARY  Visits from Start of Care: 11 Plan: Patient agrees to discharge.  Patient goals were met. Patient is being discharged due to meeting the stated rehab goals. Pt did not return for final visit, but was doing well at last visit.     Lyndee Hensen, PT, DPT 10:43 AM  03/27/21

## 2020-10-25 ENCOUNTER — Encounter: Payer: Medicare HMO | Admitting: Physical Therapy

## 2020-10-30 ENCOUNTER — Encounter: Payer: Medicare HMO | Admitting: Physical Therapy

## 2020-10-31 ENCOUNTER — Encounter: Payer: Medicare HMO | Admitting: Physical Therapy

## 2020-11-22 ENCOUNTER — Encounter: Payer: Self-pay | Admitting: Internal Medicine

## 2020-12-10 ENCOUNTER — Ambulatory Visit: Payer: Medicare HMO | Admitting: Family Medicine

## 2020-12-10 NOTE — Progress Notes (Deleted)
   I, Peterson Lombard, LAT, ATC acting as a scribe for Lynne Leader, MD.  Linda [name] is a 70 y.o. female who presents to [facility] today for f/u multiple joint arthralgia (bilat shoulder and elbow pain, and periscapular dysfunction and lateral epicondylitis). Pt was last seen by Dr. Georgina Snell on 10/08/20 and was advised to transition to less frequent PT visits, completing a total of 11 visits. Today, pt reports  Dx testing: 08/27/20 R & L shoulder XR             08/27/20 Labs (sed rate & C-reactive protein)  08/05/19 L-spine XR  Pertinent review of systems: ***  Relevant historical information: ***   Exam:  There were no vitals taken for this visit. General: Well Developed, well nourished, and in no acute distress.   MSK: ***    Lab and Radiology Results No results found for this or any previous visit (from the past 72 hour(s)). No results found.     Assessment and Plan: 70 y.o. female with ***   PDMP not reviewed this encounter. No orders of the defined types were placed in this encounter.  No orders of the defined types were placed in this encounter.    Discussed warning signs or symptoms. Please see discharge instructions. Patient expresses understanding.   ***

## 2020-12-13 ENCOUNTER — Encounter: Payer: Self-pay | Admitting: Internal Medicine

## 2020-12-18 ENCOUNTER — Encounter: Payer: Self-pay | Admitting: Internal Medicine

## 2021-03-30 ENCOUNTER — Encounter: Payer: Self-pay | Admitting: Internal Medicine

## 2021-04-02 ENCOUNTER — Other Ambulatory Visit: Payer: Self-pay

## 2021-04-02 ENCOUNTER — Encounter: Payer: Self-pay | Admitting: Internal Medicine

## 2021-04-02 ENCOUNTER — Ambulatory Visit (INDEPENDENT_AMBULATORY_CARE_PROVIDER_SITE_OTHER): Payer: Medicare HMO | Admitting: Internal Medicine

## 2021-04-02 VITALS — BP 136/88 | HR 78 | Temp 98.2°F | Ht 64.0 in | Wt 127.0 lb

## 2021-04-02 DIAGNOSIS — R052 Subacute cough: Secondary | ICD-10-CM | POA: Diagnosis not present

## 2021-04-02 DIAGNOSIS — D539 Nutritional anemia, unspecified: Secondary | ICD-10-CM | POA: Diagnosis not present

## 2021-04-02 DIAGNOSIS — U099 Post covid-19 condition, unspecified: Secondary | ICD-10-CM

## 2021-04-02 LAB — C-REACTIVE PROTEIN: CRP: 2 mg/dL (ref 0.5–20.0)

## 2021-04-02 LAB — CBC WITH DIFFERENTIAL/PLATELET
Basophils Absolute: 0 10*3/uL (ref 0.0–0.1)
Basophils Relative: 0.3 % (ref 0.0–3.0)
Eosinophils Absolute: 0.1 10*3/uL (ref 0.0–0.7)
Eosinophils Relative: 1.9 % (ref 0.0–5.0)
HCT: 35.8 % — ABNORMAL LOW (ref 36.0–46.0)
Hemoglobin: 11.9 g/dL — ABNORMAL LOW (ref 12.0–15.0)
Lymphocytes Relative: 29.2 % (ref 12.0–46.0)
Lymphs Abs: 1.8 10*3/uL (ref 0.7–4.0)
MCHC: 33.3 g/dL (ref 30.0–36.0)
MCV: 87.6 fl (ref 78.0–100.0)
Monocytes Absolute: 0.6 10*3/uL (ref 0.1–1.0)
Monocytes Relative: 9.7 % (ref 3.0–12.0)
Neutro Abs: 3.6 10*3/uL (ref 1.4–7.7)
Neutrophils Relative %: 58.9 % (ref 43.0–77.0)
Platelets: 276 10*3/uL (ref 150.0–400.0)
RBC: 4.08 Mil/uL (ref 3.87–5.11)
RDW: 13.5 % (ref 11.5–15.5)
WBC: 6.1 10*3/uL (ref 4.0–10.5)

## 2021-04-02 LAB — SARS-COV-2 IGG: SARS-COV-2 IgG: 31.89

## 2021-04-02 NOTE — Patient Instructions (Signed)

## 2021-04-02 NOTE — Progress Notes (Signed)
Subjective:  Patient ID: Linda [name], female    DOB: 1950-12-31  Age: 71 y.o. MRN: [id number]  CC: Cough  This visit occurred during the SARS-CoV-2 public health emergency.  Safety protocols were in place, including screening questions prior to the visit, additional usage of staff PPE, and extensive cleaning of exam room while observing appropriate contact time as indicated for disinfecting solutions.    HPI Linda [name] presents for cough -  She complains of a 1 month history of intermittent sore throat, nonproductive cough, and mild headache.  She denies weight loss, fever, chills, night sweats, shortness of breath, or wheezing.  Outpatient Medications Prior to Visit  Medication Sig Dispense Refill   Ascorbic Acid (VITAMIN C) 1000 MG tablet Take 1,000 mg by mouth daily.     Calcium Carb-Cholecalciferol (CALCIUM 1000 + D PO) Take by mouth daily.     cholecalciferol (VITAMIN D) 1000 units tablet Take 1.5 tablets (1,500 Units total) by mouth daily. 90 tablet 2   tretinoin (RETIN-A) 0.05 % cream APPLY A PEASIZE AMOUNT ONTO THE FACE NIGHTLY 45 g 1   No facility-administered medications prior to visit.    ROS Review of Systems  Constitutional:  Negative for chills, diaphoresis, fatigue and fever.  HENT: Negative.    Eyes: Negative.   Respiratory:  Positive for cough. Negative for chest tightness, shortness of breath and wheezing.   Cardiovascular:  Negative for chest pain, palpitations and leg swelling.  Gastrointestinal:  Negative for abdominal pain, diarrhea and nausea.  Endocrine: Negative.   Genitourinary: Negative.  Negative for difficulty urinating.  Musculoskeletal: Negative.  Negative for arthralgias and myalgias.  Skin:  Negative for color change and pallor.  Neurological:  Negative for dizziness, weakness, light-headedness and numbness.  Hematological:  Negative for adenopathy. Does not bruise/bleed easily.  Psychiatric/Behavioral: Negative.     Objective:  BP  136/88 (BP Location: Right Arm, Patient Position: Sitting, Cuff Size: Large)    Pulse 78    Temp 98.2 F (36.8 C) (Oral)    Ht 5\' 4"  (1.626 m)    Wt 127 lb (57.6 kg)    SpO2 98%    BMI 21.80 kg/m   BP Readings from Last 3 Encounters:  04/02/21 136/88  10/08/20 110/68  09/25/20 126/78    Wt Readings from Last 3 Encounters:  04/02/21 127 lb (57.6 kg)  10/08/20 128 lb (58.1 kg)  09/25/20 128 lb 9.6 oz (58.3 kg)    Physical Exam Vitals reviewed.  Constitutional:      General: She is not in acute distress.    Appearance: She is not ill-appearing, toxic-appearing or diaphoretic.  HENT:     Nose: Nose normal.     Mouth/Throat:     Mouth: Mucous membranes are moist.  Eyes:     General: No scleral icterus.    Conjunctiva/sclera: Conjunctivae normal.  Cardiovascular:     Rate and Rhythm: Normal rate and regular rhythm.     Heart sounds: No murmur heard. Pulmonary:     Effort: Pulmonary effort is normal.     Breath sounds: No stridor. No wheezing, rhonchi or rales.  Abdominal:     General: Abdomen is flat.     Palpations: There is no mass.     Tenderness: There is no abdominal tenderness. There is no guarding.     Hernia: No hernia is present.  Musculoskeletal:        General: Normal range of motion.     Cervical back: Neck  supple.     Right lower leg: No edema.     Left lower leg: No edema.  Lymphadenopathy:     Cervical: No cervical adenopathy.  Skin:    General: Skin is warm and dry.  Neurological:     General: No focal deficit present.     Mental Status: She is alert.  Psychiatric:        Mood and Affect: Mood normal.        Behavior: Behavior normal.    Lab Results  Component Value Date   WBC 6.1 04/02/2021   HGB 11.9 (L) 04/02/2021   HCT 35.8 (L) 04/02/2021   PLT 276.0 04/02/2021   GLUCOSE 107 (H) 08/08/2020   CHOL 224 (H) 08/03/2019   TRIG 79.0 08/03/2019   HDL 84.40 08/03/2019   LDLDIRECT 76.4 04/07/2011   LDLCALC 124 (H) 08/03/2019   ALT 11 08/03/2019    AST 17 08/03/2019   NA 137 08/08/2020   K 3.9 08/08/2020   CL 101 08/08/2020   CREATININE 0.47 08/08/2020   BUN 12 08/08/2020   CO2 27 08/08/2020   TSH 1.98 11/02/2019   HGBA1C 5.5 11/02/2019    CT Chest Wo Contrast  Result Date: 09/17/2020 CLINICAL DATA:  Follow-up right upper lobe infiltrate. EXAM: CT CHEST WITHOUT CONTRAST TECHNIQUE: Multidetector CT imaging of the chest was performed following the standard protocol without IV contrast. COMPARISON:  Multiple previous exams to include a plain film from 09/03/2020 and prior CT from 08/08/2020 FINDINGS: Cardiovascular: Somewhat limited due to lack of IV contrast. No aneurysmal dilatation is seen. No atherosclerotic calcifications are noted. No cardiomegaly is seen. Mediastinum/Nodes: Thoracic inlet is within normal limits. No sizable hilar or mediastinal adenopathy is noted. The esophagus as visualized is within normal limits. Lungs/Pleura: Lungs are well aerated bilaterally. Biapical pleural and parenchymal scarring is again noted and stable. Left lung remains clear of infiltrate. Stable pleural base nodule is noted in the upper lobe laterally on image number 28 of series 3. Mild scarring is noted in the right middle lobe medially similar to that seen on prior CT examination. Stable subpleural nodule is noted in the right middle lobe on image number 91 of series 3. Previously seen right upper lobe infiltrate has nearly completely resolved with some residual nodularity along the major fissure on image number 69 of series 3 and mild nodularity on image number 57 of series 3. No other focal infiltrate is seen. No sizable effusion is noted. Upper Abdomen: Visualized upper abdomen is within normal limits. Musculoskeletal: Bony structures show no acute abnormality. IMPRESSION: Near complete resolution of previously seen nodular infiltrate along the minor fissure in the right upper lobe laterally. Some residual nodularity is noted along the major fissure  as well as in the upper lobe centrally. This likely represents some waxing and waning infiltrate. Short-term follow-up in 3 months is recommended by means of noncontrast CT. Stable nodules in the lungs bilaterally. These can also be followed in 3 months. No new focal abnormality is noted. Electronically Signed   By: Inez Catalina M.D.   On: 09/17/2020 15:05   No results found.   Assessment & Plan:   Mykael was seen today for cough.  Diagnoses and all orders for this visit:  Subacute cough- Will screen for mass/infiltrate with a chest x-ray.  Her COVID antibody is significantly elevate - I think this explains her symptoms. -     SARS-COV-2 IgG; Future -     C-reactive protein; Future -  CBC with Differential/Platelet; Future -     DG Chest 2 View; Future -     CBC with Differential/Platelet -     C-reactive protein -     SARS-COV-2 IgG  Deficiency anemia- Will screen for vitamin deficiencies. 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   I am having Linda [name] maintain her Calcium Carb-Cholecalciferol (CALCIUM 1000 + D PO), vitamin C, cholecalciferol, and tretinoin.  No orders of the defined types were placed in this encounter.    Follow-up: Return in about 3 months (around 07/01/2021).  Scarlette Calico, MD

## 2021-04-03 ENCOUNTER — Encounter: Payer: Self-pay | Admitting: Internal Medicine

## 2021-04-03 ENCOUNTER — Other Ambulatory Visit (INDEPENDENT_AMBULATORY_CARE_PROVIDER_SITE_OTHER): Payer: Medicare HMO

## 2021-04-03 DIAGNOSIS — D539 Nutritional anemia, unspecified: Secondary | ICD-10-CM | POA: Insufficient documentation

## 2021-04-03 DIAGNOSIS — U099 Post covid-19 condition, unspecified: Secondary | ICD-10-CM | POA: Insufficient documentation

## 2021-04-03 DIAGNOSIS — Z20822 Contact with and (suspected) exposure to covid-19: Secondary | ICD-10-CM | POA: Diagnosis not present

## 2021-04-03 LAB — IBC + FERRITIN
Ferritin: 104.3 ng/mL (ref 10.0–291.0)
Iron: 62 ug/dL (ref 42–145)
Saturation Ratios: 21 % (ref 20.0–50.0)
TIBC: 295.4 ug/dL (ref 250.0–450.0)
Transferrin: 211 mg/dL — ABNORMAL LOW (ref 212.0–360.0)

## 2021-04-03 LAB — VITAMIN B12: Vitamin B-12: 320 pg/mL (ref 211–911)

## 2021-04-03 LAB — FOLATE: Folate: >24.2 ng/mL (ref >5.9)

## 2021-04-07 LAB — LACTATE DEHYDROGENASE: LDH: 119 U/L — ABNORMAL LOW (ref 120–250)

## 2021-04-07 LAB — RETICULOCYTES
ABS Retic: 61050 cells/uL (ref 20000–80000)
Retic Ct Pct: 1.5 %

## 2021-04-07 LAB — HAPTOGLOBIN: Haptoglobin: 253 mg/dL — ABNORMAL HIGH (ref 43–212)

## 2021-04-07 LAB — VITAMIN B1: Vitamin B1 (Thiamine): 13 nmol/L (ref 8–30)

## 2021-05-01 DIAGNOSIS — H2513 Age-related nuclear cataract, bilateral: Secondary | ICD-10-CM | POA: Diagnosis not present

## 2021-05-01 DIAGNOSIS — H0102B Squamous blepharitis left eye, upper and lower eyelids: Secondary | ICD-10-CM | POA: Diagnosis not present

## 2021-05-01 DIAGNOSIS — H0102A Squamous blepharitis right eye, upper and lower eyelids: Secondary | ICD-10-CM | POA: Diagnosis not present

## 2021-05-01 DIAGNOSIS — H35363 Drusen (degenerative) of macula, bilateral: Secondary | ICD-10-CM | POA: Diagnosis not present

## 2021-05-01 DIAGNOSIS — H04123 Dry eye syndrome of bilateral lacrimal glands: Secondary | ICD-10-CM | POA: Diagnosis not present

## 2021-05-01 DIAGNOSIS — D3132 Benign neoplasm of left choroid: Secondary | ICD-10-CM | POA: Diagnosis not present

## 2021-05-01 DIAGNOSIS — H16423 Pannus (corneal), bilateral: Secondary | ICD-10-CM | POA: Diagnosis not present

## 2021-05-01 DIAGNOSIS — H353132 Nonexudative age-related macular degeneration, bilateral, intermediate dry stage: Secondary | ICD-10-CM | POA: Diagnosis not present

## 2021-05-09 DIAGNOSIS — Z0100 Encounter for examination of eyes and vision without abnormal findings: Secondary | ICD-10-CM | POA: Diagnosis not present

## 2021-07-05 DIAGNOSIS — M810 Age-related osteoporosis without current pathological fracture: Secondary | ICD-10-CM | POA: Diagnosis not present

## 2021-07-05 DIAGNOSIS — Z01419 Encounter for gynecological examination (general) (routine) without abnormal findings: Secondary | ICD-10-CM | POA: Diagnosis not present

## 2021-07-05 DIAGNOSIS — Z6822 Body mass index (BMI) 22.0-22.9, adult: Secondary | ICD-10-CM | POA: Diagnosis not present

## 2021-07-05 DIAGNOSIS — Z01411 Encounter for gynecological examination (general) (routine) with abnormal findings: Secondary | ICD-10-CM | POA: Diagnosis not present

## 2021-07-05 DIAGNOSIS — Z124 Encounter for screening for malignant neoplasm of cervix: Secondary | ICD-10-CM | POA: Diagnosis not present

## 2021-07-08 ENCOUNTER — Encounter: Payer: Self-pay | Admitting: Internal Medicine

## 2021-07-29 DIAGNOSIS — D2262 Melanocytic nevi of left upper limb, including shoulder: Secondary | ICD-10-CM | POA: Diagnosis not present

## 2021-07-29 DIAGNOSIS — D225 Melanocytic nevi of trunk: Secondary | ICD-10-CM | POA: Diagnosis not present

## 2021-07-29 DIAGNOSIS — D485 Neoplasm of uncertain behavior of skin: Secondary | ICD-10-CM | POA: Diagnosis not present

## 2021-07-29 DIAGNOSIS — L821 Other seborrheic keratosis: Secondary | ICD-10-CM | POA: Diagnosis not present

## 2021-08-08 DIAGNOSIS — Z780 Asymptomatic menopausal state: Secondary | ICD-10-CM | POA: Diagnosis not present

## 2021-08-08 DIAGNOSIS — M810 Age-related osteoporosis without current pathological fracture: Secondary | ICD-10-CM | POA: Diagnosis not present

## 2021-08-08 LAB — HM DEXA SCAN

## 2021-08-14 ENCOUNTER — Encounter: Payer: Self-pay | Admitting: Internal Medicine

## 2021-08-14 ENCOUNTER — Ambulatory Visit: Payer: Medicare HMO | Admitting: Internal Medicine

## 2021-08-14 VITALS — BP 128/80 | HR 89 | Temp 98.3°F | Ht 64.0 in | Wt 125.0 lb

## 2021-08-14 DIAGNOSIS — W548XXA Other contact with dog, initial encounter: Secondary | ICD-10-CM | POA: Insufficient documentation

## 2021-08-14 DIAGNOSIS — F411 Generalized anxiety disorder: Secondary | ICD-10-CM | POA: Diagnosis not present

## 2021-08-14 DIAGNOSIS — Z1211 Encounter for screening for malignant neoplasm of colon: Secondary | ICD-10-CM

## 2021-08-14 DIAGNOSIS — M810 Age-related osteoporosis without current pathological fracture: Secondary | ICD-10-CM | POA: Diagnosis not present

## 2021-08-14 DIAGNOSIS — R739 Hyperglycemia, unspecified: Secondary | ICD-10-CM

## 2021-08-14 DIAGNOSIS — E559 Vitamin D deficiency, unspecified: Secondary | ICD-10-CM

## 2021-08-14 DIAGNOSIS — D539 Nutritional anemia, unspecified: Secondary | ICD-10-CM | POA: Diagnosis not present

## 2021-08-14 NOTE — Progress Notes (Signed)
    Subjective:    Patient ID: Linda [name], female    DOB: [birth year], 71 y.o.   MRN: [id number]      HPI Linda [name] is here for  Chief Complaint  Patient presents with   Dog attack    Patient attacked by 2 big dogs this morning; Patient has scratch on right leg     This morning she was attacked by 2 dogs at a restaurant.  She has a cut and a lump on the right upper leg.  She was concerned about possible infection and damage that was done.  She is unsure if she has anything on her left leg.    Medications and allergies reviewed with patient and updated if appropriate.  Current Outpatient Medications on File Prior to Visit  Medication Sig Dispense Refill   Ascorbic Acid (VITAMIN C) 1000 MG tablet Take 1,000 mg by mouth daily.     Calcium Carb-Cholecalciferol (CALCIUM 1000 + D PO) Take by mouth daily.     cholecalciferol (VITAMIN D) 1000 units tablet Take 1.5 tablets (1,500 Units total) by mouth daily. 90 tablet 2   tretinoin (RETIN-A) 0.05 % cream APPLY A PEASIZE AMOUNT ONTO THE FACE NIGHTLY 45 g 1   No current facility-administered medications on file prior to visit.    Review of Systems  Constitutional:  Negative for chills and fever.  Skin:  Positive for color change.      Objective:   Vitals:   08/14/21 1443  BP: 128/80  Pulse: 89  Temp: 98.3 F (36.8 C)  SpO2: 97%   BP Readings from Last 3 Encounters:  08/14/21 128/80  04/02/21 136/88  10/08/20 110/68   Wt Readings from Last 3 Encounters:  08/14/21 125 lb (56.7 kg)  04/02/21 127 lb (57.6 kg)  10/08/20 128 lb (58.1 kg)   Body mass index is 21.46 kg/m.    Physical Exam Constitutional:      General: She is not in acute distress.    Appearance: Normal appearance.  HENT:     Head: Normocephalic and atraumatic.  Skin:    General: Skin is warm and dry.     Comments: Right upper lateral leg scratch as seen below with surrounding ecchymosis-area is tender to touch.  Upper area of scratch has broken the  skin-small amount of dried blood present.  No active bleeding.  No surrounding induration, hematoma or pain.  Sensation intact.   Left upper lateral leg without bruises or scratches-area nontender  Neurological:     Mental Status: She is alert.            Assessment & Plan:    See Problem List for Assessment and Plan of chronic medical problems.

## 2021-08-14 NOTE — Assessment & Plan Note (Signed)
Acute Occurred this morning-dog scratched right upper lateral leg resulting in slight break in skin, scratch and mild ecchymosis Tetanus up-to-date Area looks clean and I think she is low risk for infection so will not prescribe any empiric antibiotics-she will monitor the area closely and if there is any concerning changes she will come in or I will send an antibiotic in for her

## 2021-08-14 NOTE — Patient Instructions (Addendum)
    Monitor your wound and let me know if there are any concerning changes.    Your tetanus vaccine is up to date   Medications changes include :   none     Return if symptoms worsen or fail to improve.

## 2021-08-15 ENCOUNTER — Encounter: Payer: Self-pay | Admitting: Internal Medicine

## 2021-08-15 ENCOUNTER — Other Ambulatory Visit (INDEPENDENT_AMBULATORY_CARE_PROVIDER_SITE_OTHER): Payer: Medicare HMO

## 2021-08-15 DIAGNOSIS — E559 Vitamin D deficiency, unspecified: Secondary | ICD-10-CM | POA: Diagnosis not present

## 2021-08-15 DIAGNOSIS — R739 Hyperglycemia, unspecified: Secondary | ICD-10-CM | POA: Diagnosis not present

## 2021-08-15 DIAGNOSIS — F411 Generalized anxiety disorder: Secondary | ICD-10-CM

## 2021-08-15 DIAGNOSIS — D539 Nutritional anemia, unspecified: Secondary | ICD-10-CM

## 2021-08-15 LAB — CBC WITH DIFFERENTIAL/PLATELET
Basophils Absolute: 0 10*3/uL (ref 0.0–0.1)
Basophils Relative: 0.4 % (ref 0.0–3.0)
Eosinophils Absolute: 0.1 10*3/uL (ref 0.0–0.7)
Eosinophils Relative: 1.6 % (ref 0.0–5.0)
HCT: 35.5 % — ABNORMAL LOW (ref 36.0–46.0)
Hemoglobin: 12.1 g/dL (ref 12.0–15.0)
Lymphocytes Relative: 31 % (ref 12.0–46.0)
Lymphs Abs: 1.2 10*3/uL (ref 0.7–4.0)
MCHC: 34 g/dL (ref 30.0–36.0)
MCV: 86.8 fl (ref 78.0–100.0)
Monocytes Absolute: 0.4 10*3/uL (ref 0.1–1.0)
Monocytes Relative: 10 % (ref 3.0–12.0)
Neutro Abs: 2.3 10*3/uL (ref 1.4–7.7)
Neutrophils Relative %: 57 % (ref 43.0–77.0)
Platelets: 235 10*3/uL (ref 150.0–400.0)
RBC: 4.1 Mil/uL (ref 3.87–5.11)
RDW: 14.2 % (ref 11.5–15.5)
WBC: 4 10*3/uL (ref 4.0–10.5)

## 2021-08-15 LAB — COMPREHENSIVE METABOLIC PANEL
ALT: 8 U/L (ref 0–35)
AST: 13 U/L (ref 0–37)
Albumin: 4.1 g/dL (ref 3.5–5.2)
Alkaline Phosphatase: 69 U/L (ref 39–117)
BUN: 13 mg/dL (ref 6–23)
CO2: 30 mEq/L (ref 19–32)
Calcium: 9.7 mg/dL (ref 8.4–10.5)
Chloride: 102 mEq/L (ref 96–112)
Creatinine, Ser: 0.53 mg/dL (ref 0.40–1.20)
GFR: 93.31 mL/min (ref >60.00)
Glucose, Bld: 109 mg/dL — ABNORMAL HIGH (ref 70–99)
Potassium: 4 mEq/L (ref 3.5–5.1)
Sodium: 139 mEq/L (ref 135–145)
Total Bilirubin: 0.6 mg/dL (ref 0.2–1.2)
Total Protein: 7.1 g/dL (ref 6.0–8.3)

## 2021-08-15 LAB — LIPID PANEL
Cholesterol: 182 mg/dL (ref 0–200)
HDL: 82.5 mg/dL (ref >39.00)
LDL Cholesterol: 87 mg/dL (ref 0–99)
NonHDL: 99.83
Total CHOL/HDL Ratio: 2
Triglycerides: 66 mg/dL (ref 0.0–149.0)
VLDL: 13.2 mg/dL (ref 0.0–40.0)

## 2021-08-15 LAB — IBC PANEL
Iron: 51 ug/dL (ref 42–145)
Saturation Ratios: 17.5 % — ABNORMAL LOW (ref 20.0–50.0)
TIBC: 291.2 ug/dL (ref 250.0–450.0)
Transferrin: 208 mg/dL — ABNORMAL LOW (ref 212.0–360.0)

## 2021-08-15 LAB — VITAMIN D 25 HYDROXY (VIT D DEFICIENCY, FRACTURES): VITD: 31.37 ng/mL (ref 30.00–100.00)

## 2021-08-15 LAB — TSH: TSH: 1.92 u[IU]/mL (ref 0.35–5.50)

## 2021-08-15 LAB — FERRITIN: Ferritin: 77.7 ng/mL (ref 10.0–291.0)

## 2021-08-15 LAB — HEMOGLOBIN A1C: Hgb A1c MFr Bld: 5.7 % (ref 4.6–6.5)

## 2021-08-16 ENCOUNTER — Encounter: Payer: Self-pay | Admitting: Internal Medicine

## 2021-08-16 ENCOUNTER — Encounter: Payer: Medicare HMO | Admitting: Internal Medicine

## 2021-08-18 ENCOUNTER — Encounter: Payer: Self-pay | Admitting: Internal Medicine

## 2021-08-18 NOTE — Patient Instructions (Addendum)
Medications changes include :   None    Return in about 1 year (around 08/21/2022) for Physical Exam.   Health Maintenance, Female Adopting a healthy lifestyle and getting preventive care are important in promoting health and wellness. Ask your health care provider about: The right schedule for you to have regular tests and exams. Things you can do on your own to prevent diseases and keep yourself healthy. What should I know about diet, weight, and exercise? Eat a healthy diet  Eat a diet that includes plenty of vegetables, fruits, low-fat dairy products, and lean protein. Do not eat a lot of foods that are high in solid fats, added sugars, or sodium. Maintain a healthy weight Body mass index (BMI) is used to identify weight problems. It estimates body fat based on height and weight. Your health care provider can help determine your BMI and help you achieve or maintain a healthy weight. Get regular exercise Get regular exercise. This is one of the most important things you can do for your health. Most adults should: Exercise for at least 150 minutes each week. The exercise should increase your heart rate and make you sweat (moderate-intensity exercise). Do strengthening exercises at least twice a week. This is in addition to the moderate-intensity exercise. Spend less time sitting. Even light physical activity can be beneficial. Watch cholesterol and blood lipids Have your blood tested for lipids and cholesterol at 71 years of age, then have this test every 5 years. Have your cholesterol levels checked more often if: Your lipid or cholesterol levels are high. You are older than 71 years of age. You are at high risk for heart disease. What should I know about cancer screening? Depending on your health history and family history, you may need to have cancer screening at various ages. This may include screening for: Breast cancer. Cervical cancer. Colorectal cancer. Skin  cancer. Lung cancer. What should I know about heart disease, diabetes, and high blood pressure? Blood pressure and heart disease High blood pressure causes heart disease and increases the risk of stroke. This is more likely to develop in people who have high blood pressure readings or are overweight. Have your blood pressure checked: Every 3-5 years if you are 36-56 years of age. Every year if you are 21 years old or older. Diabetes Have regular diabetes screenings. This checks your fasting blood sugar level. Have the screening done: Once every three years after age 66 if you are at a normal weight and have a low risk for diabetes. More often and at a younger age if you are overweight or have a high risk for diabetes. What should I know about preventing infection? Hepatitis B If you have a higher risk for hepatitis B, you should be screened for this virus. Talk with your health care provider to find out if you are at risk for hepatitis B infection. Hepatitis C Testing is recommended for: Everyone born from [birth year] through 1965. Anyone with known risk factors for hepatitis C. Sexually transmitted infections (STIs) Get screened for STIs, including gonorrhea and chlamydia, if: You are sexually active and are younger than 71 years of age. You are older than 71 years of age and your health care provider tells you that you are at risk for this type of infection. Your sexual activity has changed since you were last screened, and you are at increased risk for chlamydia or gonorrhea. Ask your health care provider if you are at risk. Ask  your health care provider about whether you are at high risk for HIV. Your health care provider may recommend a prescription medicine to help prevent HIV infection. If you choose to take medicine to prevent HIV, you should first get tested for HIV. You should then be tested every 3 months for as long as you are taking the medicine. Pregnancy If you are about to stop  having your period (premenopausal) and you may become pregnant, seek counseling before you get pregnant. Take 400 to 800 micrograms (mcg) of folic acid every day if you become pregnant. Ask for birth control (contraception) if you want to prevent pregnancy. Osteoporosis and menopause Osteoporosis is a disease in which the bones lose minerals and strength with aging. This can result in bone fractures. If you are 69 years old or older, or if you are at risk for osteoporosis and fractures, ask your health care provider if you should: Be screened for bone loss. Take a calcium or vitamin D supplement to lower your risk of fractures. Be given hormone replacement therapy (HRT) to treat symptoms of menopause. Follow these instructions at home: Alcohol use Do not drink alcohol if: Your health care provider tells you not to drink. You are pregnant, may be pregnant, or are planning to become pregnant. If you drink alcohol: Limit how much you have to: 0-1 drink a day. Know how much alcohol is in your drink. In the [place] one drink equals one 12 oz bottle of beer (355 mL), one 5 oz glass of wine (148 mL), or one 1 oz glass of hard liquor (44 mL). Lifestyle Do not use any products that contain nicotine or tobacco. These products include cigarettes, chewing tobacco, and vaping devices, such as e-cigarettes. If you need help quitting, ask your health care provider. Do not use street drugs. Do not share needles. Ask your health care provider for help if you need support or information about quitting drugs. General instructions Schedule regular health, dental, and eye exams. Stay current with your vaccines. Tell your health care provider if: You often feel depressed. You have ever been abused or do not feel safe at home. Summary Adopting a healthy lifestyle and getting preventive care are important in promoting health and wellness. Follow your health care provider's instructions about healthy diet,  exercising, and getting tested or screened for diseases. Follow your health care provider's instructions on monitoring your cholesterol and blood pressure. This information is not intended to replace advice given to you by your health care provider. Make sure you discuss any questions you have with your health care provider. Document Revised: 07/30/2020 Document Reviewed: 07/30/2020 Elsevier Patient Education  [employer].

## 2021-08-18 NOTE — Progress Notes (Unsigned)
Subjective:    Patient ID: Linda [name], female    DOB: [birth year], 71 y.o.   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  She was on steroids for a while.  Does not take anything for the pain.     Medications and allergies reviewed with patient and updated if appropriate.  Current Outpatient Medications on File Prior to Visit  Medication Sig Dispense Refill   Ascorbic Acid (VITAMIN C) 1000 MG tablet Take 1,000 mg by mouth daily.     Calcium Carb-Cholecalciferol (CALCIUM 1000 + D PO) Take by mouth daily.     cholecalciferol (VITAMIN D) 1000 units tablet Take 1.5 tablets (1,500 Units total) by mouth daily. 90 tablet 2   tretinoin (RETIN-A) 0.05 % cream APPLY A PEASIZE AMOUNT ONTO THE FACE NIGHTLY 45 g 1   No current facility-administered medications on file prior to visit.    Review of Systems  Constitutional:  Negative for fever.  Eyes:  Negative for visual disturbance.  Respiratory:  Negative for cough, shortness of breath and wheezing.   Cardiovascular:  Positive for palpitations (occ). Negative for chest pain and leg swelling.  Gastrointestinal:  Negative for abdominal pain, blood in stool, constipation, diarrhea and nausea.       Occ gerd  Genitourinary:  Negative for dysuria.  Musculoskeletal:  Positive for arthralgias (shoulders, knees) and myalgias (PMR related). Negative for back pain.  Skin:  Negative for rash.  Neurological:  Negative for light-headedness and headaches.  Psychiatric/Behavioral:  Negative for dysphoric mood. The patient is nervous/anxious.       Objective:   Vitals:   08/20/21 0814  BP: 126/72  Pulse: 84  Temp: 98 F (36.7 C)  SpO2: 97%   Filed Weights   08/20/21 0814  Weight: 124 lb 12.8 oz (56.6 kg)   Body mass index is 21.42 kg/m.  BP Readings from Last 3 Encounters:  08/20/21 126/72  08/14/21 128/80  04/02/21 136/88     Wt Readings from Last 3 Encounters:  08/20/21 124 lb 12.8 oz (56.6 kg)  08/14/21 125 lb (56.7 kg)  04/02/21 127 lb (57.6 kg)       Physical Exam Constitutional: She appears well-developed and well-nourished. No distress.  HENT:  Head: Normocephalic and atraumatic.  Right Ear: External ear normal. Normal ear canal and TM Left Ear: External ear normal.  Normal ear canal and TM Mouth/Throat: Oropharynx is clear and moist.  Eyes: Conjunctivae normal.  Neck: Neck supple. No tracheal deviation present. No thyromegaly present.  No carotid bruit  Cardiovascular: Normal rate, regular rhythm and normal heart sounds.   No murmur heard.  No edema. Pulmonary/Chest: Effort normal and breath sounds normal. No respiratory distress. She has no wheezes. She has no rales.  Breast: deferred   Abdominal: Soft. She exhibits no distension. There is no tenderness.  Lymphadenopathy: She has no cervical adenopathy.  Skin: Skin is warm and dry. She is not diaphoretic.  Psychiatric: She has a normal mood and affect. Her behavior is normal.     Lab Results  Component Value Date   WBC 4.0 08/15/2021   HGB 12.1 08/15/2021   HCT 35.5 (L) 08/15/2021   PLT 235.0 08/15/2021   GLUCOSE 109 (H) 08/15/2021   CHOL 182 08/15/2021   TRIG 66.0 08/15/2021   HDL 82.50 08/15/2021  LDLDIRECT 76.4 04/07/2011   LDLCALC 87 08/15/2021   ALT 8 08/15/2021   AST 13 08/15/2021   NA 139 08/15/2021   K 4.0 08/15/2021   CL 102 08/15/2021   CREATININE 0.53 08/15/2021   BUN 13 08/15/2021   CO2 30 08/15/2021   TSH 1.92 08/15/2021   HGBA1C 5.7 08/15/2021         Assessment & Plan:   Physical exam: Screening blood work  reviewed Exercise  regular Weight  normal Substance abuse  none   Reviewed recommended immunizations.   Health Maintenance  Topic Date Due   MAMMOGRAM  08/19/2020   COLONOSCOPY (Pts 45-28yr Insurance coverage will need to be confirmed)  04/17/2021   DEXA SCAN  05/29/2021   COVID-19  Vaccine (5 - Booster for Pfizer series) 09/05/2021 (Originally 02/08/2021)   Zoster Vaccines- Shingrix (1 of 2) 11/20/2021 (Originally 08/09/2000)   INFLUENZA VACCINE  10/22/2021   TETANUS/TDAP  10/02/2025   Pneumonia Vaccine 71 Years old  Completed   Hepatitis C Screening  Completed   HPV VACCINES  Aged Out      Referred to GI for colonoscopy  Recommended mammogram    See Problem List for Assessment and Plan of chronic medical problems.

## 2021-08-20 ENCOUNTER — Ambulatory Visit (INDEPENDENT_AMBULATORY_CARE_PROVIDER_SITE_OTHER): Payer: Medicare HMO | Admitting: Internal Medicine

## 2021-08-20 VITALS — BP 126/72 | HR 84 | Temp 98.0°F | Ht 64.0 in | Wt 124.8 lb

## 2021-08-20 DIAGNOSIS — R7303 Prediabetes: Secondary | ICD-10-CM | POA: Diagnosis not present

## 2021-08-20 DIAGNOSIS — Z0000 Encounter for general adult medical examination without abnormal findings: Secondary | ICD-10-CM

## 2021-08-20 DIAGNOSIS — E559 Vitamin D deficiency, unspecified: Secondary | ICD-10-CM | POA: Diagnosis not present

## 2021-08-20 DIAGNOSIS — M810 Age-related osteoporosis without current pathological fracture: Secondary | ICD-10-CM | POA: Diagnosis not present

## 2021-08-20 NOTE — Assessment & Plan Note (Signed)
Chronic Vitamin D in normal range Continue vitamin D daily

## 2021-08-20 NOTE — Assessment & Plan Note (Addendum)
Chronic DEXA up-to-date via GYN-Dr. Pamala Hurry Continue regular exercise Continue calcium and vitamin D daily Will be talking to Dr Pamala Hurry soon about about reclast - recommended reclast

## 2021-08-20 NOTE — Assessment & Plan Note (Signed)
Chronic Lab Results  Component Value Date   HGBA1C 5.7 08/15/2021   Sugars just barely in prediabetic range Continue regular exercise Continue healthy diet

## 2021-08-28 ENCOUNTER — Encounter: Payer: Self-pay | Admitting: Internal Medicine

## 2021-09-11 ENCOUNTER — Ambulatory Visit (INDEPENDENT_AMBULATORY_CARE_PROVIDER_SITE_OTHER): Payer: Medicare HMO

## 2021-09-11 ENCOUNTER — Ambulatory Visit: Payer: Medicare HMO | Admitting: Internal Medicine

## 2021-09-11 DIAGNOSIS — Z0000 Encounter for general adult medical examination without abnormal findings: Secondary | ICD-10-CM | POA: Diagnosis not present

## 2021-09-11 NOTE — Progress Notes (Cosign Needed)
I connected with [name] today by telephone and verified that I am speaking with the correct person using two identifiers. Location patient: home Location provider: work Persons participating in the virtual visit: patient, provider.   I discussed the limitations, risks, security and privacy concerns of performing an evaluation and management service by telephone and the availability of in person appointments. I also discussed with the patient that there may be a patient responsible charge related to this service. The patient expressed understanding and verbally consented to this telephonic visit.    Interactive audio and video telecommunications were attempted between this provider and patient, however failed, due to patient having technical difficulties OR patient did not have access to video capability.  We continued and completed visit with audio only.  Some vital signs may be absent or patient reported.   Time Spent with patient on telephone encounter: 30 minutes  Subjective:   Linda [name] is a 71 y.o. female who presents for Medicare Annual (Subsequent) preventive examination.  Review of Systems     Cardiac Risk Factors include: advanced age (>69mn, >>37women);family history of premature cardiovascular disease     Objective:    There were no vitals filed for this visit. There is no height or weight on file to calculate BMI.     09/11/2021    3:45 PM 09/10/2020    9:05 AM 09/03/2020   10:24 AM 08/29/2020    2:13 PM 08/08/2020   10:36 AM 08/28/2019    3:58 PM 08/03/2019    9:56 AM  Advanced Directives  Does Patient Have a Medical Advance Directive? No  No No No No No  Would patient like information on creating a medical advance directive? No - Patient declined No - Patient declined No - Patient declined No - Patient declined No - Patient declined No - Patient declined No - Patient declined    Current Medications (verified) Outpatient Encounter Medications as of  09/11/2021  Medication Sig   Ascorbic Acid (VITAMIN C) 1000 MG tablet Take 1,000 mg by mouth daily.   Calcium Carb-Cholecalciferol (CALCIUM 1000 + D PO) Take by mouth daily.   cholecalciferol (VITAMIN D) 1000 units tablet Take 1.5 tablets (1,500 Units total) by mouth daily.   tretinoin (RETIN-A) 0.05 % cream APPLY A PEASIZE AMOUNT ONTO THE FACE NIGHTLY   No facility-administered encounter medications on file as of 09/11/2021.    Allergies (verified) Fosamax [alendronate]   History: Past Medical History:  Diagnosis Date   Cellulitis of leg, right 08/27/2016   Hyperlipidemia    Osteopenia    Pneumonia    Vitamin D deficiency    Past Surgical History:  Procedure Laterality Date   COLONOSCOPY  03/2011   Dr Brodie;diminuitive polyp   G 2 P 2     TONSILLECTOMY     Family History  Problem Relation Age of Onset   Melanoma Father    Hyperlipidemia Mother    Heart disease Mother        no MI   Diabetes Paternal Grandmother        vision loss   Diabetes Maternal Uncle    Colon cancer Neg Hx    Stomach cancer Neg Hx    Esophageal cancer Neg Hx    Rectal cancer Neg Hx    Stroke Neg Hx    Social History   Socioeconomic History   Marital status: Married    Spouse name: Not on file   Number of children: 2  Years of education: Not on file   Highest education level: Not on file  Occupational History   Occupation: banking  Tobacco Use   Smoking status: Never   Smokeless tobacco: Never  Vaping Use   Vaping Use: Never used  Substance and Sexual Activity   Alcohol use: Yes    Comment:  1-2 wine or beer nightly   Drug use: No   Sexual activity: Not on file  Other Topics Concern   Not on file  Social History Narrative   Exercise:  Yoga, weights, walking      Two children, grandchildren   Social Determinants of Health   Financial Resource Strain: Low Risk  (09/11/2021)   Overall Financial Resource Strain (CARDIA)    Difficulty of Paying Living Expenses: Not hard at all   Food Insecurity: No Food Insecurity (09/11/2021)   Hunger Vital Sign    Worried About Running Out of Food in the Last Year: Never true    Ran Out of Food in the Last Year: Never true  Transportation Needs: No Transportation Needs (09/11/2021)   PRAPARE - Hydrologist (Medical): No    Lack of Transportation (Non-Medical): No  Physical Activity: Sufficiently Active (09/11/2021)   Exercise Vital Sign    Days of Exercise per Week: 7 days    Minutes of Exercise per Session: 60 min  Stress: No Stress Concern Present (09/11/2021)   [place]    Feeling of Stress : Not at all  Social Connections: [place] (09/11/2021)   Social Connection and Isolation Panel [NHANES]    Frequency of Communication with Friends and Family: More than three times a week    Frequency of Social Gatherings with Friends and Family: More than three times a week    Attends Religious Services: More than 4 times per year    Active Member of [employer] or Organizations: No    Attends Music therapist: More than 4 times per year    Marital Status: Married    Tobacco Counseling Counseling given: Not Answered   Clinical Intake:  Pre-visit preparation completed: Yes  Pain : No/denies pain     BMI - recorded: 21.42 Nutritional Status: BMI of 19-24  Normal Nutritional Risks: None Diabetes: No  How often do you need to have someone help you when you read instructions, pamphlets, or other written materials from your doctor or pharmacy?: 1 - Never What is the last grade level you completed in school?: HSG; 2 years of college  Diabetic? no  Interpreter Needed?: No  Information entered by :: Lisette Abu, LPN.   Activities of Daily Living    09/11/2021    3:46 PM  In your present state of health, do you have any difficulty performing the following activities:  Hearing? 0  Vision? 0  Difficulty  concentrating or making decisions? 0  Walking or climbing stairs? 0  Dressing or bathing? 0  Doing errands, shopping? 0  Preparing Food and eating ? N  Using the Toilet? N  In the past six months, have you accidently leaked urine? N  Do you have problems with loss of bowel control? N  Managing your Medications? N  Managing your Finances? N  Housekeeping or managing your Housekeeping? N    Patient Care Team: Binnie Rail, MD as PCP - General (Internal Medicine) Clent Jacks, MD as Referring Physician (Ophthalmology)  Indicate any recent Medical Services you may have  received from other than [facility] providers in the past year (date may be approximate).     Assessment:   This is a routine wellness examination for Linda [name].  Hearing/Vision screen Hearing Screening - Comments:: Patient denied any hearing difficulty; no hearing aids. Vision Screening - Comments:: Patient wears corrective lenses/contacts. Eye exam done by: Clent Jacks, MD.  Dietary issues and exercise activities discussed: Current Exercise Habits: Structured exercise class, Type of exercise: stretching;strength training/weights;exercise ball;treadmill;walking;yoga;Other - see comments (zumba), Time (Minutes): 60, Frequency (Times/Week): 7, Weekly Exercise (Minutes/Week): 420, Intensity: Moderate, Exercise limited by: None identified   Goals Addressed             This Visit's Progress    My goal is to be pain free.        Depression Screen    09/11/2021    3:38 PM 08/20/2021    8:21 AM 09/10/2020    9:21 AM 08/14/2020   12:36 PM 08/03/2019    8:58 AM 01/27/2017    8:11 AM 03/26/2016    5:34 PM  PHQ 2/9 Scores  PHQ - 2 Score 0 0 0 1 0 0 0  PHQ- 9 Score  1  5       Fall Risk    08/20/2021    8:21 AM 09/10/2020    9:06 AM 08/14/2020    9:01 AM 08/03/2019    9:56 AM 08/03/2019    8:58 AM  Fall Risk   Falls in the past year? 0 0 0 0 0  Number falls in past yr: 0 0 0 0 0  Injury with Fall? 0 0 0 0 0  Risk for  fall due to : No Fall Risks No Fall Risks No Fall Risks No Fall Risks   Follow up Falls evaluation completed Falls evaluation completed Falls evaluation completed Falls evaluation completed;Education provided;Falls prevention discussed Falls evaluation completed    FALL RISK PREVENTION PERTAINING TO THE HOME:  Any stairs in or around the home? Yes  If so, are there any without handrails? No  Home free of loose throw rugs in walkways, pet beds, electrical cords, etc? Yes  Adequate lighting in your home to reduce risk of falls? Yes   ASSISTIVE DEVICES UTILIZED TO PREVENT FALLS:  Life alert? No  Use of a cane, walker or w/c? No  Grab bars in the bathroom? No  Shower chair or bench in shower? Yes  Elevated toilet seat or a handicapped toilet? Yes   TIMED UP AND GO:  Was the test performed? No .  Length of time to ambulate 10 feet: n/a sec.   Appearance of gait: Patient not evaluated for gait during this visit.  Cognitive Function:        09/11/2021    3:47 PM 08/03/2019   10:00 AM  6CIT Screen  What Year? 0 points 0 points  What month? 0 points 0 points  What time? 0 points 0 points  Count back from 20 0 points 0 points  Months in reverse 0 points 0 points  Repeat phrase 0 points 0 points  Total Score 0 points 0 points    Immunizations Immunization History  Administered Date(s) Administered   Fluad Quad(high Dose 65+) 11/18/2018   Influenza, High Dose Seasonal PF 04/30/2016, 12/06/2016, 12/15/2017, 12/29/2019   Influenza,inj,Quad PF,6+ Mos 01/19/2015   Influenza-Unspecified 12/22/2013, 12/06/2016, 11/17/2019   PFIZER(Purple Top)SARS-COV-2 Vaccination 04/12/2019, 05/02/2019, 12/29/2019, 12/14/2020   Pneumococcal Conjugate-13 10/17/2015   Pneumococcal Polysaccharide-23 01/27/2017   Td 11/22/2004  Tdap 10/03/2015   Zoster, Live 10/03/2015    TDAP status: Up to date  Flu Vaccine status: Up to date  Pneumococcal vaccine status: Up to date  Covid-19 vaccine  status: Completed vaccines  Qualifies for Shingles Vaccine? Yes   Zostavax completed Yes   Shingrix Completed?: No.    Education has been provided regarding the importance of this vaccine. Patient has been advised to call insurance company to determine out of pocket expense if they have not yet received this vaccine. Advised may also receive vaccine at local pharmacy or Health Dept. Verbalized acceptance and understanding.  Screening Tests Health Maintenance  Topic Date Due   MAMMOGRAM  08/19/2020   COVID-19 Vaccine (5 - Pfizer series) 02/08/2021   COLONOSCOPY (Pts 45-61yr Insurance coverage will need to be confirmed)  04/17/2021   DEXA SCAN  05/29/2021   Zoster Vaccines- Shingrix (1 of 2) 11/20/2021 (Originally 08/09/2000)   INFLUENZA VACCINE  10/22/2021   TETANUS/TDAP  10/02/2025   Pneumonia Vaccine 71 Years old  Completed   Hepatitis C Screening  Completed   HPV VACCINES  Aged Out    Health Maintenance  Health Maintenance Due  Topic Date Due   MAMMOGRAM  08/19/2020   COVID-19 Vaccine (5 - [place]) 02/08/2021   COLONOSCOPY (Pts 45-460yrInsurance coverage will need to be confirmed)  04/17/2021   DEXA SCAN  05/29/2021    Colorectal cancer screening: Type of screening: Colonoscopy. Completed 04/18/2011. Repeat every 10 years  Mammogram status: Completed 12/12/2019. Repeat every year  Bone Density status: Completed 05/30/2019. Results reflect: Bone density results: OSTEOPOROSIS. Repeat every 2-3 years.  Lung Cancer Screening: (Low Dose CT Chest recommended if Age 71-80ears, 30 pack-year currently smoking OR have quit w/in 15years.) does not qualify.   Lung Cancer Screening Referral: no  Additional Screening:  Hepatitis C Screening: does qualify; Completed 10/04/2015  Vision Screening: Recommended annual ophthalmology exams for early detection of glaucoma and other disorders of the eye. Is the patient up to date with their annual eye exam?  No  Who is the provider or  what is the name of the office in which the patient attends annual eye exams? [facility]. If pt is not established with a provider, would they like to be referred to a provider to establish care? No .   Dental Screening: Recommended annual dental exams for proper oral hygiene  Community Resource Referral / Chronic Care Management: CRR required this visit?  No   CCM required this visit?  No      Plan:     I have personally reviewed and noted the following in the patient's chart:   Medical and social history Use of alcohol, tobacco or illicit drugs  Current medications and supplements including opioid prescriptions.  Functional ability and status Nutritional status Physical activity Advanced directives List of other physicians Hospitalizations, surgeries, and ER visits in previous 12 months Vitals Screenings to include cognitive, depression, and falls Referrals and appointments  In addition, I have reviewed and discussed with patient certain preventive protocols, quality metrics, and best practice recommendations. A written personalized care plan for preventive services as well as general preventive health recommendations were provided to patient.     ShSheral FlowLPN   08/28/[birth year]/3419 Nurse Notes:  Patient is cogitatively intact. There were no vitals filed for this visit. There is no height or weight on file to calculate BMI. Patient stated that she has no issues with gait or balance; does not  use any assistive devices. Medications reviewed with patient; no opioid use noted.

## 2021-09-11 NOTE — Patient Instructions (Signed)
Ms. Linda [name] , Thank you for taking time to come for your Medicare Wellness Visit. I appreciate your ongoing commitment to your health goals. Please review the following plan we discussed and let me know if I can assist you in the future.   Screening recommendations/referrals: Colonoscopy: 04/18/2011; due every 10 years Mammogram: 12/12/2019; due every 1-2 years Bone Density: 05/30/2019; due every 2-3 years Recommended yearly ophthalmology/optometry visit for glaucoma screening and checkup Recommended yearly dental visit for hygiene and checkup  Vaccinations: Influenza vaccine: due Fall Season 2023 Pneumococcal vaccine: 10/17/2015, 01/27/2017 Tdap vaccine: 10/03/2015; due every 10 years Shingles vaccine: never done   Covid-19: 04/12/2019, 05/02/2019, 12/29/2019, 12/14/2020  Advanced directives: No  Conditions/risks identified: Yes  Next appointment: Please schedule your next Medicare Wellness Visit with your Nurse Health Advisor in 1 year by calling [phone].   Preventive Care 21 Years and Older, Female Preventive care refers to lifestyle choices and visits with your health care provider that can promote health and wellness. What does preventive care include? A yearly physical exam. This is also called an annual well check. Dental exams once or twice a year. Routine eye exams. Ask your health care provider how often you should have your eyes checked. Personal lifestyle choices, including: Daily care of your teeth and gums. Regular physical activity. Eating a healthy diet. Avoiding tobacco and drug use. Limiting alcohol use. Practicing safe sex. Taking low-dose aspirin every day. Taking vitamin and mineral supplements as recommended by your health care provider. What happens during an annual well check? The services and screenings done by your health care provider during your annual well check will depend on your age, overall health, lifestyle risk factors, and family history of  disease. Counseling  Your health care provider may ask you questions about your: Alcohol use. Tobacco use. Drug use. Emotional well-being. Home and relationship well-being. Sexual activity. Eating habits. History of falls. Memory and ability to understand (cognition). Work and work Statistician. Reproductive health. Screening  You may have the following tests or measurements: Height, weight, and BMI. Blood pressure. Lipid and cholesterol levels. These may be checked every 5 years, or more frequently if you are over 41 years old. Skin check. Lung cancer screening. You may have this screening every year starting at age 34 if you have a 30-pack-year history of smoking and currently smoke or have quit within the past 15 years. Fecal occult blood test (FOBT) of the stool. You may have this test every year starting at age 7. Flexible sigmoidoscopy or colonoscopy. You may have a sigmoidoscopy every 5 years or a colonoscopy every 10 years starting at age 73. Hepatitis C blood test. Hepatitis B blood test. Sexually transmitted disease (STD) testing. Diabetes screening. This is done by checking your blood sugar (glucose) after you have not eaten for a while (fasting). You may have this done every 1-3 years. Bone density scan. This is done to screen for osteoporosis. You may have this done starting at age 49. Mammogram. This may be done every 1-2 years. Talk to your health care provider about how often you should have regular mammograms. Talk with your health care provider about your test results, treatment options, and if necessary, the need for more tests. Vaccines  Your health care provider may recommend certain vaccines, such as: Influenza vaccine. This is recommended every year. Tetanus, diphtheria, and acellular pertussis (Tdap, Td) vaccine. You may need a Td booster every 10 years. Zoster vaccine. You may need this after age 34. Pneumococcal 13-valent conjugate (  PCV13) vaccine. One  dose is recommended after age 4. Pneumococcal polysaccharide (PPSV23) vaccine. One dose is recommended after age 90. Talk to your health care provider about which screenings and vaccines you need and how often you need them. This information is not intended to replace advice given to you by your health care provider. Make sure you discuss any questions you have with your health care provider. Document Released: 04/06/2015 Document Revised: 11/28/2015 Document Reviewed: 01/09/2015 Elsevier Interactive Patient Education  2017 [place] Prevention in the Home Falls can cause injuries. They can happen to people of all ages. There are many things you can do to make your home safe and to help prevent falls. What can I do on the outside of my home? Regularly fix the edges of walkways and driveways and fix any cracks. Remove anything that might make you trip as you walk through a door, such as a raised step or threshold. Trim any bushes or trees on the path to your home. Use bright outdoor lighting. Clear any walking paths of anything that might make someone trip, such as rocks or tools. Regularly check to see if handrails are loose or broken. Make sure that both sides of any steps have handrails. Any raised decks and porches should have guardrails on the edges. Have any leaves, snow, or ice cleared regularly. Use sand or salt on walking paths during winter. Clean up any spills in your garage right away. This includes oil or grease spills. What can I do in the bathroom? Use night lights. Install grab bars by the toilet and in the tub and shower. Do not use towel bars as grab bars. Use non-skid mats or decals in the tub or shower. If you need to sit down in the shower, use a plastic, non-slip stool. Keep the floor dry. Clean up any water that spills on the floor as soon as it happens. Remove soap buildup in the tub or shower regularly. Attach bath mats securely with double-sided  non-slip rug tape. Do not have throw rugs and other things on the floor that can make you trip. What can I do in the bedroom? Use night lights. Make sure that you have a light by your bed that is easy to reach. Do not use any sheets or blankets that are too big for your bed. They should not hang down onto the floor. Have a firm chair that has side arms. You can use this for support while you get dressed. Do not have throw rugs and other things on the floor that can make you trip. What can I do in the kitchen? Clean up any spills right away. Avoid walking on wet floors. Keep items that you use a lot in easy-to-reach places. If you need to reach something above you, use a strong step stool that has a grab bar. Keep electrical cords out of the way. Do not use floor polish or wax that makes floors slippery. If you must use wax, use non-skid floor wax. Do not have throw rugs and other things on the floor that can make you trip. What can I do with my stairs? Do not leave any items on the stairs. Make sure that there are handrails on both sides of the stairs and use them. Fix handrails that are broken or loose. Make sure that handrails are as long as the stairways. Check any carpeting to make sure that it is firmly attached to the stairs. Fix any carpet that is  loose or worn. Avoid having throw rugs at the top or bottom of the stairs. If you do have throw rugs, attach them to the floor with carpet tape. Make sure that you have a light switch at the top of the stairs and the bottom of the stairs. If you do not have them, ask someone to add them for you. What else can I do to help prevent falls? Wear shoes that: Do not have high heels. Have rubber bottoms. Are comfortable and fit you well. Are closed at the toe. Do not wear sandals. If you use a stepladder: Make sure that it is fully opened. Do not climb a closed stepladder. Make sure that both sides of the stepladder are locked into place. Ask  someone to hold it for you, if possible. Clearly mark and make sure that you can see: Any grab bars or handrails. First and last steps. Where the edge of each step is. Use tools that help you move around (mobility aids) if they are needed. These include: Canes. Walkers. Scooters. Crutches. Turn on the lights when you go into a dark area. Replace any light bulbs as soon as they burn out. Set up your furniture so you have a clear path. Avoid moving your furniture around. If any of your floors are uneven, fix them. If there are any pets around you, be aware of where they are. Review your medicines with your doctor. Some medicines can make you feel dizzy. This can increase your chance of falling. Ask your doctor what other things that you can do to help prevent falls. This information is not intended to replace advice given to you by your health care provider. Make sure you discuss any questions you have with your health care provider. Document Released: 01/04/2009 Document Revised: 08/16/2015 Document Reviewed: 04/14/2014 Elsevier Interactive Patient Education  2017 [employer].

## 2021-09-12 ENCOUNTER — Encounter: Payer: Self-pay | Admitting: Internal Medicine

## 2021-09-30 ENCOUNTER — Encounter: Payer: Self-pay | Admitting: Internal Medicine

## 2021-09-30 DIAGNOSIS — M810 Age-related osteoporosis without current pathological fracture: Secondary | ICD-10-CM | POA: Diagnosis not present

## 2021-09-30 NOTE — Progress Notes (Signed)
Outside notes received. Information abstracted. Notes sent to scan.  

## 2021-10-08 ENCOUNTER — Encounter: Payer: Self-pay | Admitting: Internal Medicine

## 2021-12-13 ENCOUNTER — Telehealth: Payer: Self-pay

## 2021-12-13 DIAGNOSIS — Z1239 Encounter for other screening for malignant neoplasm of breast: Secondary | ICD-10-CM

## 2021-12-13 NOTE — Telephone Encounter (Signed)
Mammo ordered to [facility] per pt's location request.

## 2021-12-25 ENCOUNTER — Encounter: Payer: Self-pay | Admitting: Internal Medicine

## 2021-12-27 DIAGNOSIS — Z23 Encounter for immunization: Secondary | ICD-10-CM | POA: Diagnosis not present

## 2022-04-18 IMAGING — CT CT CHEST W/O CM
2 of 4 series · 15 of 36 positions shown, 18 images · non-contrast
Comparison: None

CLINICAL DATA: Pneumonia, effusion or abscess suspected in a
69-year-old female.

EXAM:
CT CHEST WITHOUT CONTRAST
TECHNIQUE: Multidetector CT imaging of the chest was performed following the
standard protocol without IV contrast.

[Series 2: routine chest without · axial · non-contrast · 0.58mm/px · z∈[+1360,+1612]mm · 12 of 150 slices shown, 15 images]
[im 12/150  mediastinal]
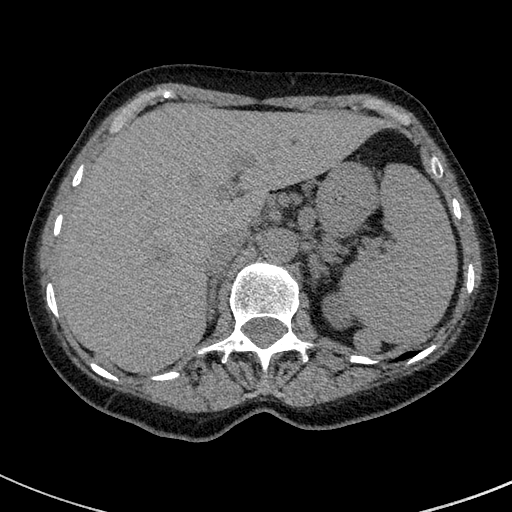
[im 12/150  lung]
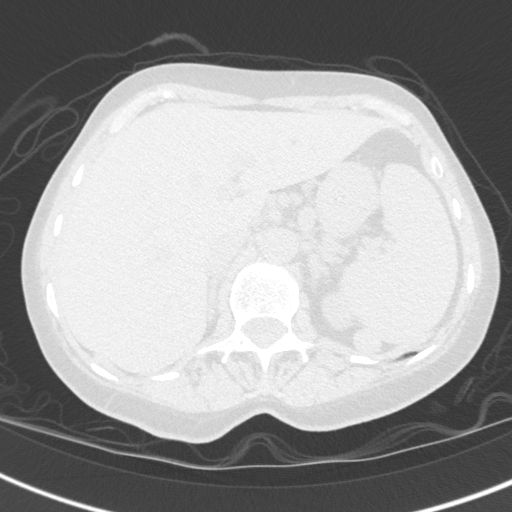
[im 23/150  lung]
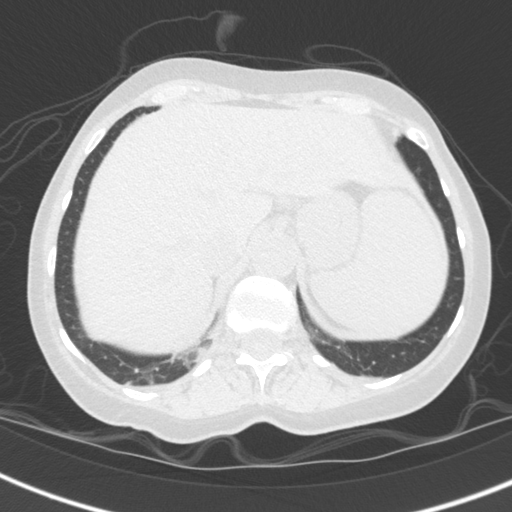
[im 35/150  lung]
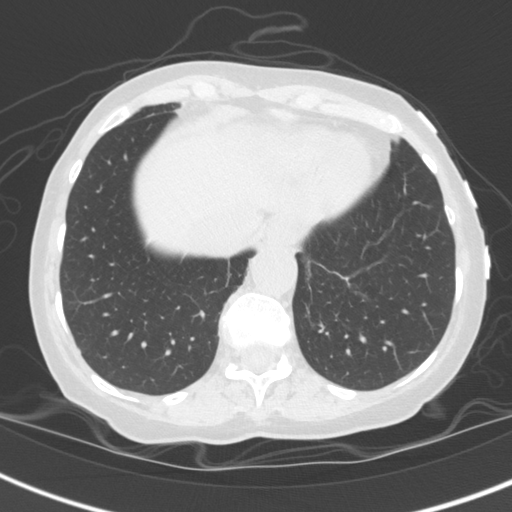
[im 46/150  lung]
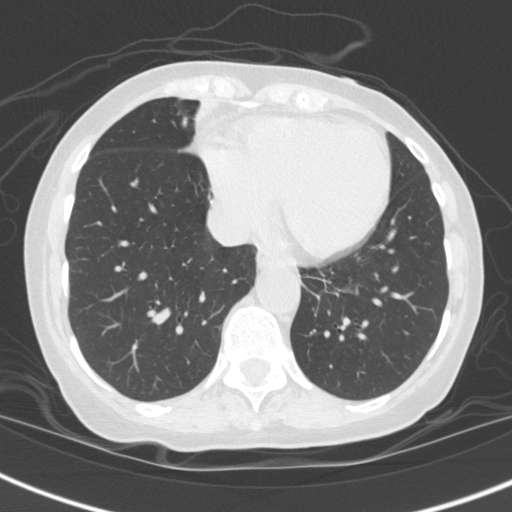
[im 58/150  mediastinal]
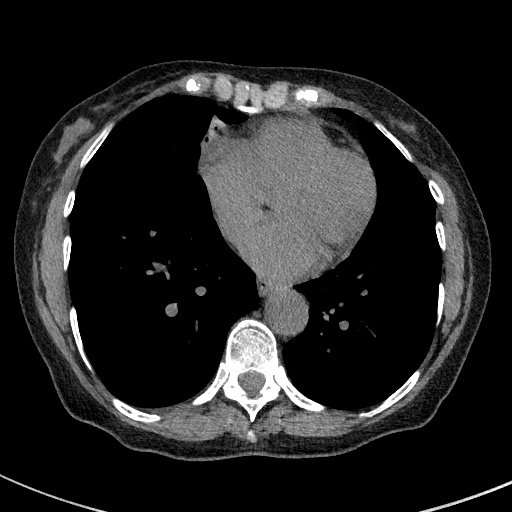
[im 58/150  lung]
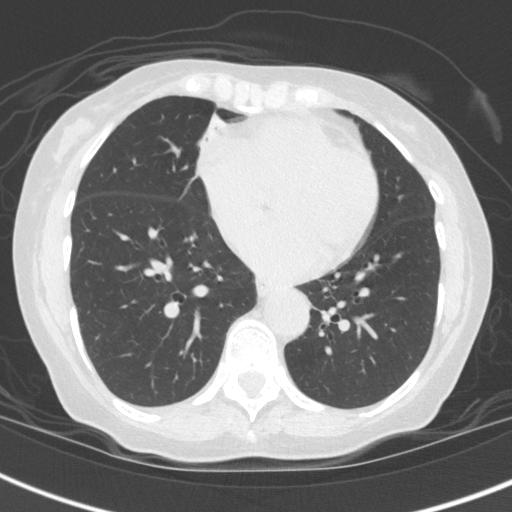
[im 69/150  lung]
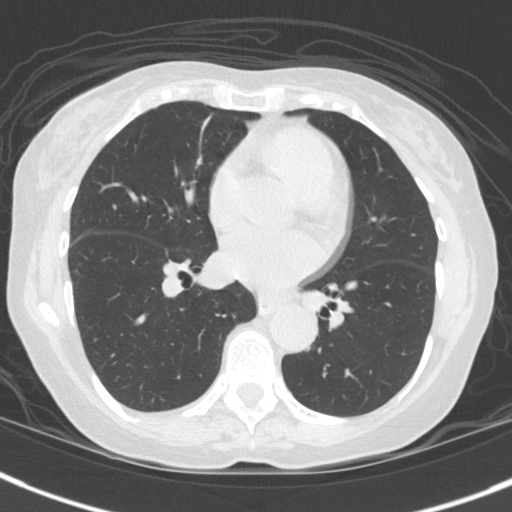
[im 81/150  lung]
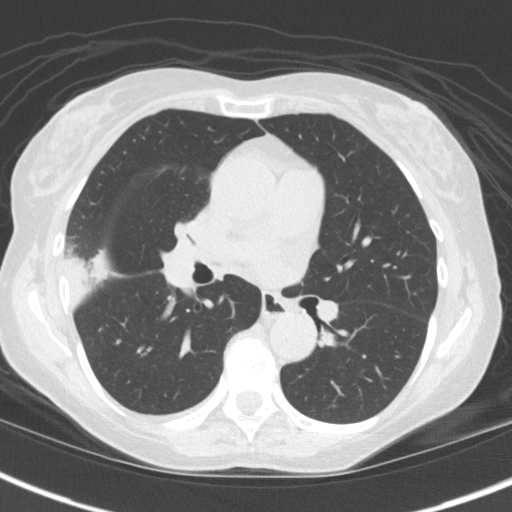
[im 92/150  lung]
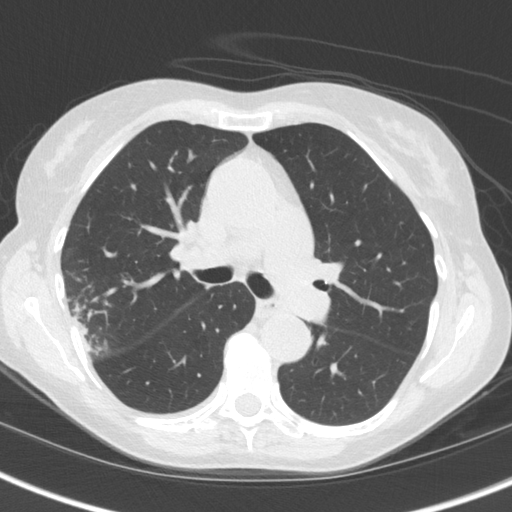
[im 104/150  mediastinal]
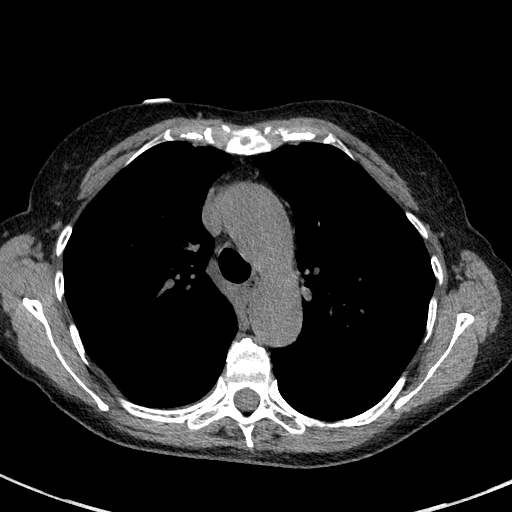
[im 104/150  lung]
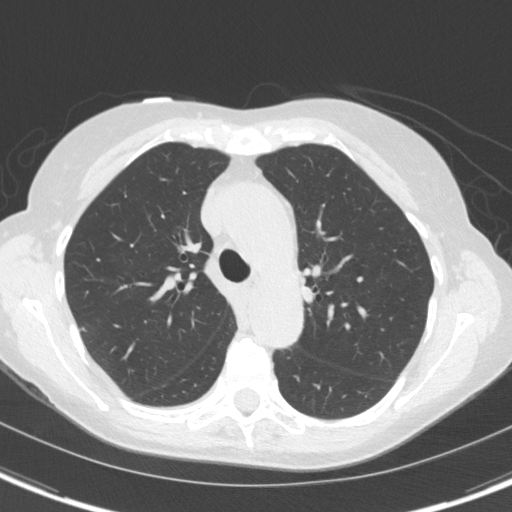
[im 115/150  lung]
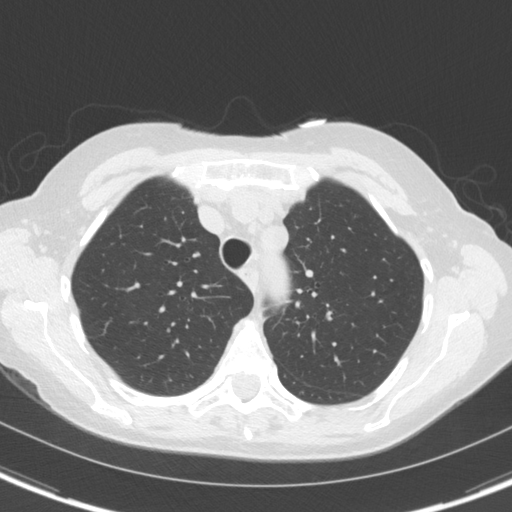
[im 127/150  lung]
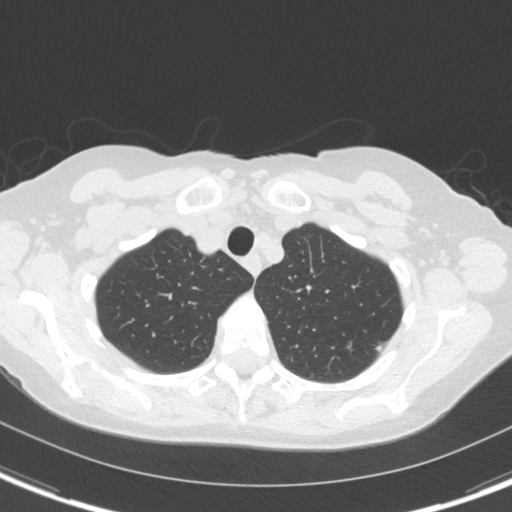
[im 138/150  lung]
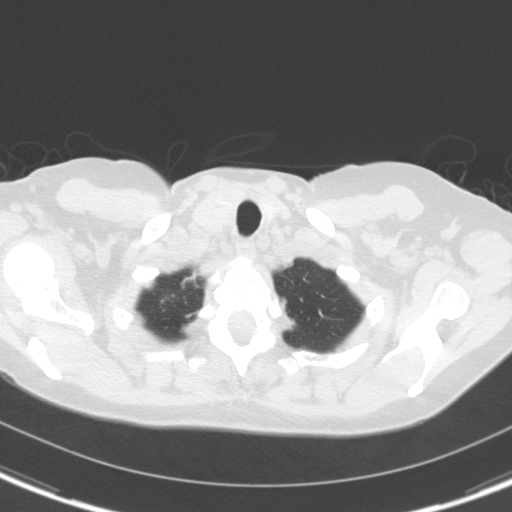

[Series 5: coronal · coronal · 0.63mm/px · 3 of 121 slices shown]
[im 25/121  lung]
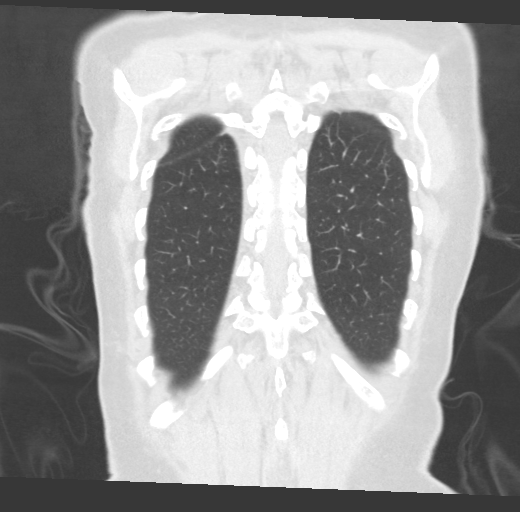
[im 49/121  lung]
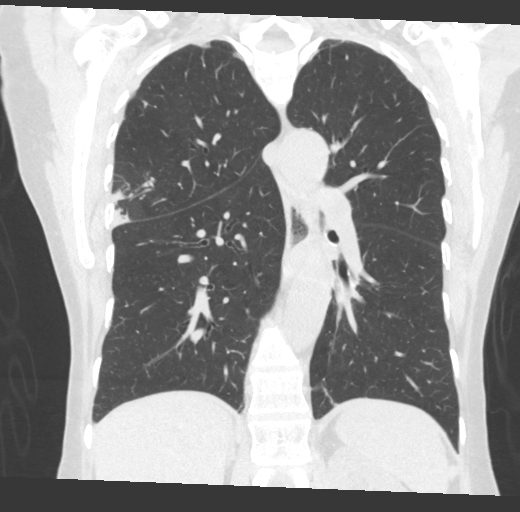
[im 73/121  lung]
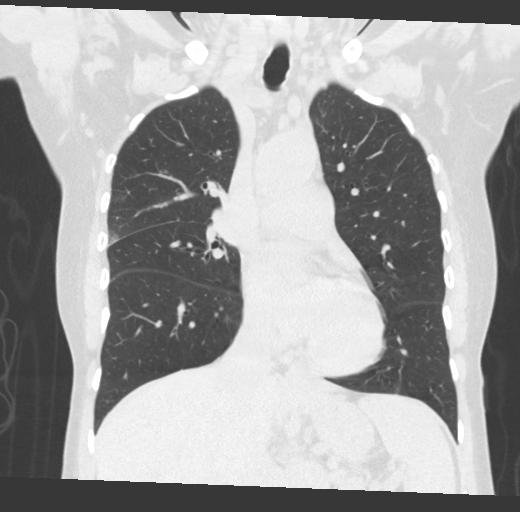

[15 of 36 positions shown; findings below may reference images not displayed]

FINDINGS: Cardiovascular: No significant aortic atherosclerosis on noncontrast
imaging. No aortic dilation. Normal heart size. No substantial
pericardial effusion. Normal caliber of the central pulmonary
vessels. Limited assessment of cardiovascular structures given lack
of intravenous contrast.

Mediastinum/Nodes: Thoracic inlet structures are normal. Esophagus
grossly normal. No axillary lymphadenopathy.

No mediastinal adenopathy. Scattered small lymph nodes in the
mediastinum. No gross hilar nodal enlargement on noncontrast
evaluation.

Lungs/Pleura: Nodular airspace disease in the RIGHT lateral chest
along the fissural confluence in the RIGHT upper lobe measuring in
total approximately 3.5 x 2.9 cm. Other areas tracking cephalad
along the peripheral RIGHT upper lobe. Some subtle ground-glass in
the area but the predominant abnormality is airspace disease and
nodularity. Small discrete peripheral nodules in this location.

Small nodules elsewhere in the chest, for instance on image 89 of
series 4 measuring 5 mm.

Pleural based nodule in the upper lobe on the LEFT (image [DATE]) 7
mm. Biapical pleural and parenchymal scarring. No consolidation. No
pleural effusion. Airways are patent. Scarring is evident in the
RIGHT middle lobe.

Upper Abdomen: Incidental imaging of upper abdominal contents
without acute process. Imaged portions the liver, spleen, pancreas,
adrenal glands and upper pole of LEFT kidney are unremarkable.

Musculoskeletal: Spinal degenerative changes. No acute or
destructive bone process.
IMPRESSION: RIGHT upper lobe nodular airspace disease at the along the fissural
confluence with small adjacent nodules could reflect infectious or
inflammatory process. Not in a classic lobar distribution and with
irregular appearance that makes neoplasm a differential
consideration. Short interval follow-up is suggested to assess for
resolution, this could be considered at 8-12 weeks.

PET evaluation could also be considered based on appearance
particularly if there are no symptoms that would suggest infection.

Other small nodules in the chest largest in the RIGHT middle lobe at
the periphery. Suggest attention on follow-up with subsequent
follow-up as dictated based on follow-up studies.

## 2022-05-08 DIAGNOSIS — Z1231 Encounter for screening mammogram for malignant neoplasm of breast: Secondary | ICD-10-CM | POA: Diagnosis not present

## 2022-05-14 IMAGING — DX DG CHEST 2V
2 series · 2 of 2 positions shown · non-contrast
Comparison: Chest x-ray and chest CT 08/08/2020

CLINICAL DATA: Pneumonia follow-up

EXAM:
CHEST - 2 VIEW

[chest pa]
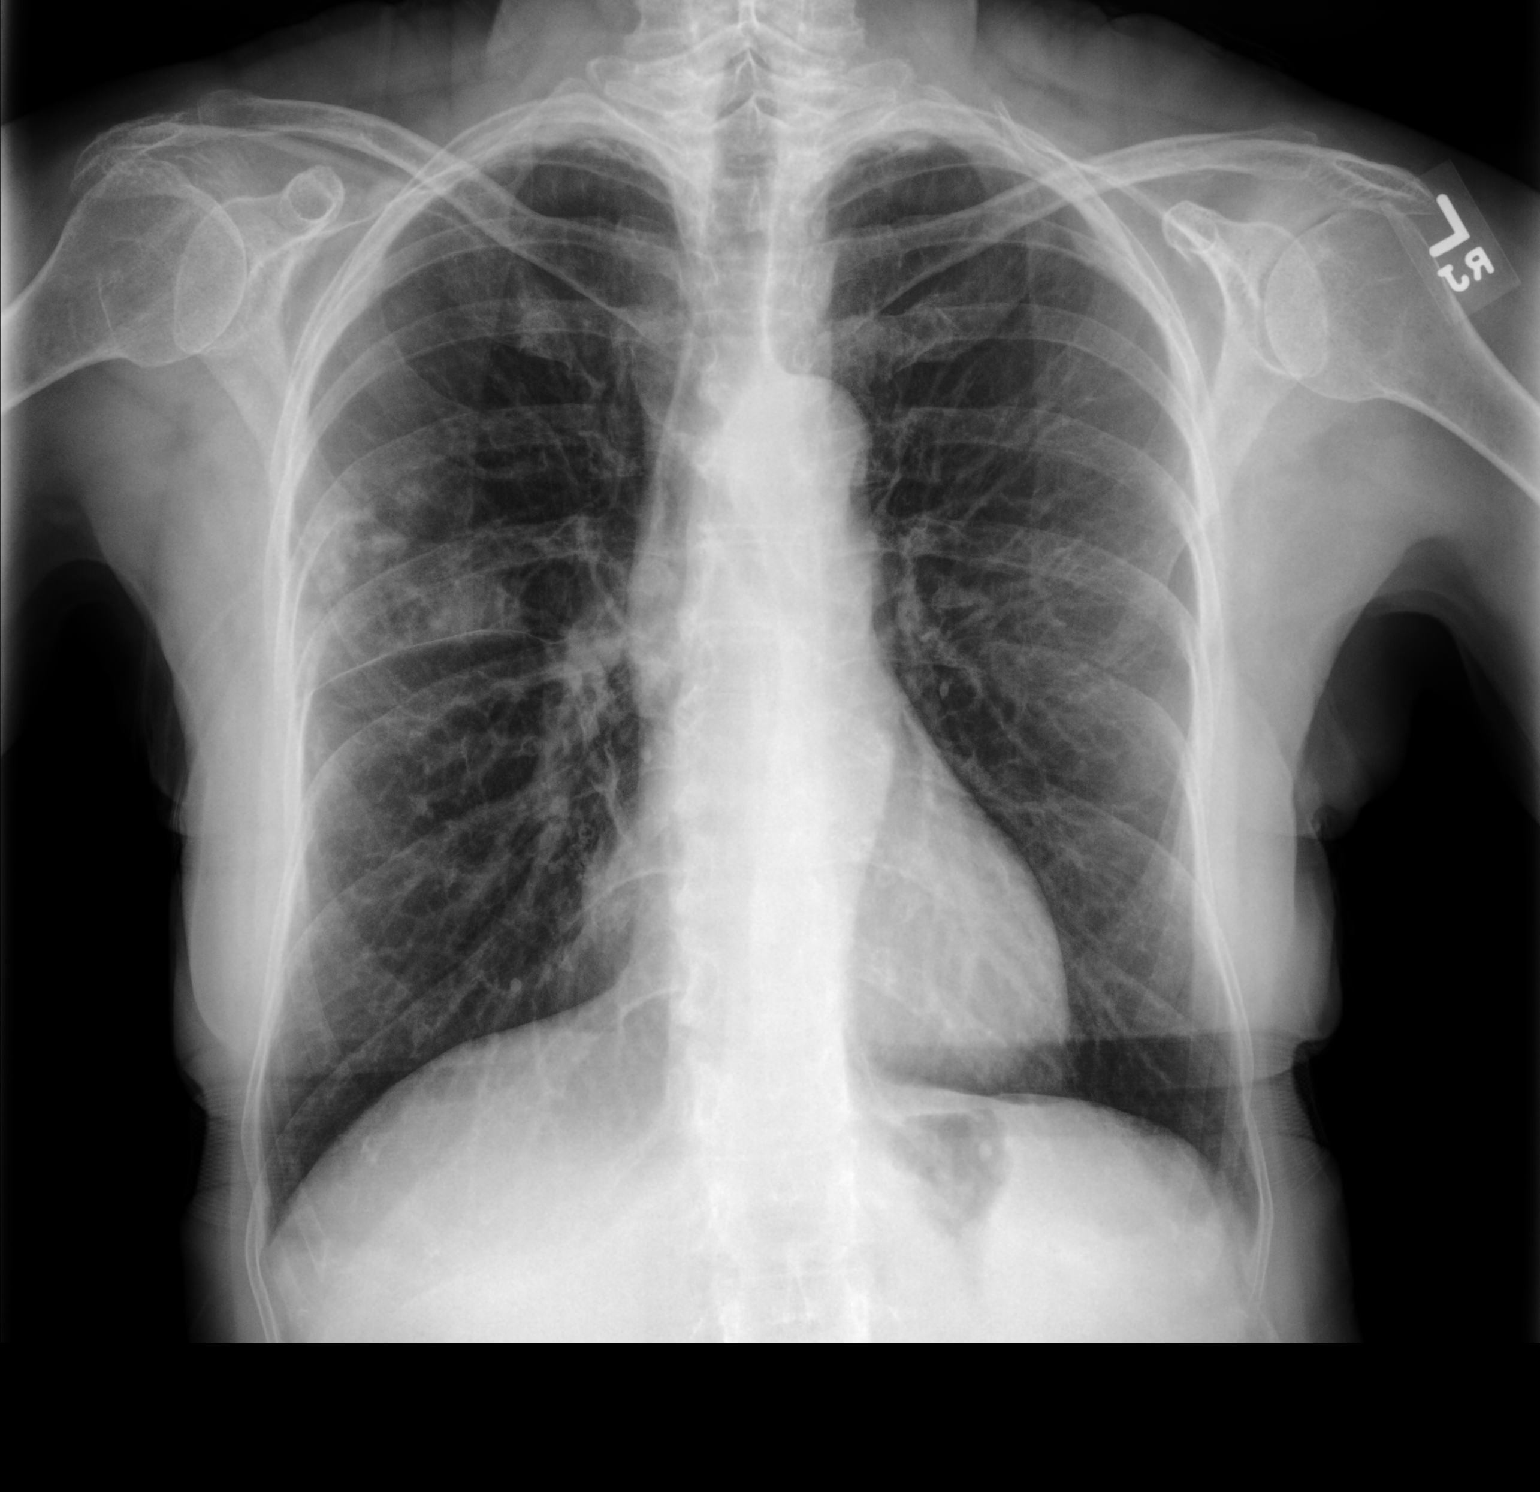

[chest lat]
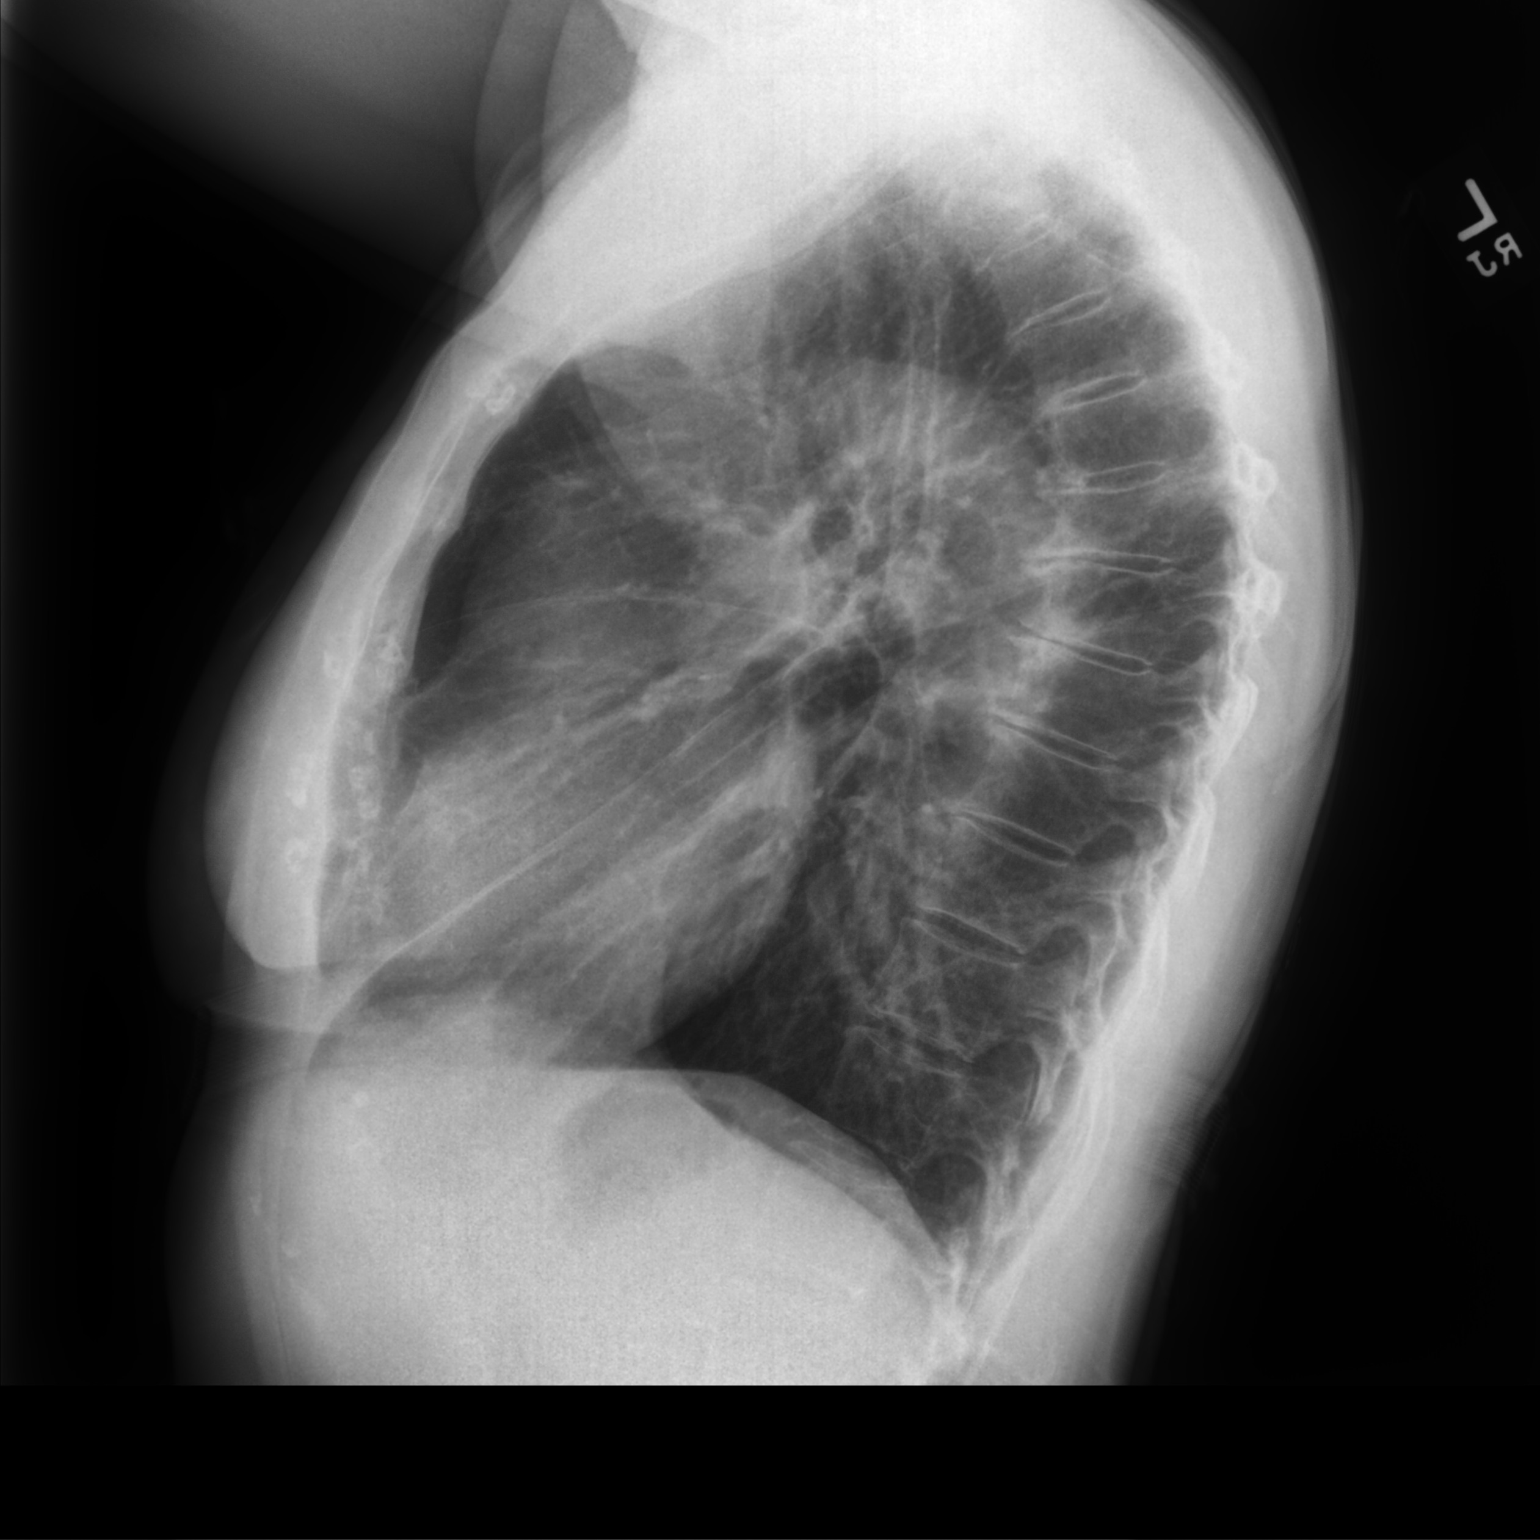

[2 of 2 positions shown; findings below may reference images not displayed]

FINDINGS: Right upper lobe infiltrate shows mild progression. There is mild
thickening of the major fissure.

Remaining lungs are clear.  No heart failure or effusion.
IMPRESSION: Right upper lobe infiltrate with mild progression. Continued close
follow-up recommended.

## 2022-05-15 DIAGNOSIS — R922 Inconclusive mammogram: Secondary | ICD-10-CM | POA: Diagnosis not present

## 2022-06-05 DIAGNOSIS — H2513 Age-related nuclear cataract, bilateral: Secondary | ICD-10-CM | POA: Diagnosis not present

## 2022-06-05 DIAGNOSIS — H353132 Nonexudative age-related macular degeneration, bilateral, intermediate dry stage: Secondary | ICD-10-CM | POA: Diagnosis not present

## 2022-06-05 DIAGNOSIS — H5213 Myopia, bilateral: Secondary | ICD-10-CM | POA: Diagnosis not present

## 2022-06-05 DIAGNOSIS — H0102B Squamous blepharitis left eye, upper and lower eyelids: Secondary | ICD-10-CM | POA: Diagnosis not present

## 2022-06-05 DIAGNOSIS — H04123 Dry eye syndrome of bilateral lacrimal glands: Secondary | ICD-10-CM | POA: Diagnosis not present

## 2022-06-05 DIAGNOSIS — H0102A Squamous blepharitis right eye, upper and lower eyelids: Secondary | ICD-10-CM | POA: Diagnosis not present

## 2022-07-10 DIAGNOSIS — N951 Menopausal and female climacteric states: Secondary | ICD-10-CM | POA: Diagnosis not present

## 2022-07-10 DIAGNOSIS — Z124 Encounter for screening for malignant neoplasm of cervix: Secondary | ICD-10-CM | POA: Diagnosis not present

## 2022-07-10 DIAGNOSIS — Z01419 Encounter for gynecological examination (general) (routine) without abnormal findings: Secondary | ICD-10-CM | POA: Diagnosis not present

## 2022-07-10 DIAGNOSIS — Z6822 Body mass index (BMI) 22.0-22.9, adult: Secondary | ICD-10-CM | POA: Diagnosis not present

## 2022-07-10 DIAGNOSIS — Z1231 Encounter for screening mammogram for malignant neoplasm of breast: Secondary | ICD-10-CM | POA: Diagnosis not present

## 2022-07-10 DIAGNOSIS — Z01411 Encounter for gynecological examination (general) (routine) with abnormal findings: Secondary | ICD-10-CM | POA: Diagnosis not present

## 2022-07-10 DIAGNOSIS — M810 Age-related osteoporosis without current pathological fracture: Secondary | ICD-10-CM | POA: Diagnosis not present

## 2022-07-29 ENCOUNTER — Encounter: Payer: Self-pay | Admitting: Internal Medicine

## 2022-07-29 NOTE — Progress Notes (Unsigned)
    Subjective:    Patient ID: Linda [name], female    DOB: 1950/08/16, 72 y.o.   MRN: [id number]      HPI Linda [name] is here for No chief complaint on file.    She is here for an acute visit for cold symptoms.   Her symptoms started   She is experiencing   She has tried taking      Medications and allergies reviewed with patient and updated if appropriate.  Current Outpatient Medications on File Prior to Visit  Medication Sig Dispense Refill   Ascorbic Acid (VITAMIN C) 1000 MG tablet Take 1,000 mg by mouth daily.     Calcium Carb-Cholecalciferol (CALCIUM 1000 + D PO) Take by mouth daily.     cholecalciferol (VITAMIN D) 1000 units tablet Take 1.5 tablets (1,500 Units total) by mouth daily. 90 tablet 2   tretinoin (RETIN-A) 0.05 % cream APPLY A PEASIZE AMOUNT ONTO THE FACE NIGHTLY 45 g 1   No current facility-administered medications on file prior to visit.    Review of Systems     Objective:  There were no vitals filed for this visit. BP Readings from Last 3 Encounters:  08/20/21 126/72  08/14/21 128/80  04/02/21 136/88   Wt Readings from Last 3 Encounters:  08/20/21 124 lb 12.8 oz (56.6 kg)  08/14/21 125 lb (56.7 kg)  04/02/21 127 lb (57.6 kg)   There is no height or weight on file to calculate BMI.    Physical Exam         Assessment & Plan:    See Problem List for Assessment and Plan of chronic medical problems.

## 2022-07-30 ENCOUNTER — Ambulatory Visit (INDEPENDENT_AMBULATORY_CARE_PROVIDER_SITE_OTHER): Payer: Medicare HMO | Admitting: Internal Medicine

## 2022-07-30 ENCOUNTER — Encounter: Payer: Self-pay | Admitting: Internal Medicine

## 2022-07-30 VITALS — BP 124/80 | HR 80 | Temp 98.3°F | Ht 64.0 in | Wt 127.0 lb

## 2022-07-30 DIAGNOSIS — J029 Acute pharyngitis, unspecified: Secondary | ICD-10-CM

## 2022-07-30 DIAGNOSIS — L821 Other seborrheic keratosis: Secondary | ICD-10-CM | POA: Diagnosis not present

## 2022-07-30 DIAGNOSIS — R1310 Dysphagia, unspecified: Secondary | ICD-10-CM | POA: Diagnosis not present

## 2022-07-30 DIAGNOSIS — D2262 Melanocytic nevi of left upper limb, including shoulder: Secondary | ICD-10-CM | POA: Diagnosis not present

## 2022-07-30 DIAGNOSIS — D225 Melanocytic nevi of trunk: Secondary | ICD-10-CM | POA: Diagnosis not present

## 2022-07-30 DIAGNOSIS — L570 Actinic keratosis: Secondary | ICD-10-CM | POA: Diagnosis not present

## 2022-07-30 DIAGNOSIS — D2261 Melanocytic nevi of right upper limb, including shoulder: Secondary | ICD-10-CM | POA: Diagnosis not present

## 2022-07-30 NOTE — Patient Instructions (Addendum)
          Medications changes include :   take over the counter pepcid daily x 3 weeks      Return if symptoms worsen or fail to improve.

## 2022-07-30 NOTE — Assessment & Plan Note (Signed)
New Pain with swallowing food for the past 3 weeks-symptoms are intermittent and are not every time she eats. No difficulty swallowing or any difficulty getting food down No pain with swallowing saliva, liquid or pills that she is aware of Occasional GERD, mild PND but no other significant allergy symptoms Not taking any over-the-counter medications ?  Asymptomatic GERD, esophageal ulcer, related to PND History is not giving an obvious cause Discussed ENT referral or GI referral for further evaluation-she deferred both at this time She would like to monitor symptoms and keep more of a record of when it occurs Advised to try OTC Pepcid 20 mg daily for 3 weeks to see if that helps If symptoms do not improve/resolve will need further evaluation

## 2022-08-06 ENCOUNTER — Telehealth: Payer: Self-pay | Admitting: Internal Medicine

## 2022-08-06 NOTE — Telephone Encounter (Signed)
Contacted Linda [name] to schedule their annual wellness visit. Appointment made for 08/21/2022.  [facility] Care Guide [facility] TEAM Direct Dial: [phone]

## 2022-08-21 ENCOUNTER — Ambulatory Visit (INDEPENDENT_AMBULATORY_CARE_PROVIDER_SITE_OTHER): Payer: Medicare HMO

## 2022-08-21 VITALS — Ht 64.0 in | Wt 125.0 lb

## 2022-08-21 DIAGNOSIS — Z1211 Encounter for screening for malignant neoplasm of colon: Secondary | ICD-10-CM | POA: Diagnosis not present

## 2022-08-21 DIAGNOSIS — Z0000 Encounter for general adult medical examination without abnormal findings: Secondary | ICD-10-CM

## 2022-08-21 NOTE — Patient Instructions (Signed)
Linda [name] , Thank you for taking time to come for your Medicare Wellness Visit. I appreciate your ongoing commitment to your health goals. Please review the following plan we discussed and let me know if I can assist you in the future.   These are the goals we discussed:  Goals      Client understands the importance of follow-up with providers by attending scheduled visits     Lose weight and continue to stay active.     My goal is to be pain free.     Patient Stated     08/21/2022, maintain current health        This is a list of the screening recommended for you and due dates:  Health Maintenance  Topic Date Due   Zoster (Shingles) Vaccine (1 of 2) Never done   Mammogram  08/19/2020   Colon Cancer Screening  04/17/2021   Flu Shot  10/23/2022   DEXA scan (bone density measurement)  08/09/2023   Medicare Annual Wellness Visit  08/21/2023   DTaP/Tdap/Td vaccine (3 - Td or Tdap) 10/02/2025   Pneumonia Vaccine  Completed   COVID-19 Vaccine  Completed   Hepatitis C Screening  Completed   HPV Vaccine  Aged Out    Advanced directives: Please bring a copy of your POA (Power of [place]) and/or Living Will to your next appointment.   Conditions/risks identified: none  Next appointment: Follow up in one year for your annual wellness visit    Preventive Care 65 Years and Older, Female Preventive care refers to lifestyle choices and visits with your health care provider that can promote health and wellness. What does preventive care include? A yearly physical exam. This is also called an annual well check. Dental exams once or twice a year. Routine eye exams. Ask your health care provider how often you should have your eyes checked. Personal lifestyle choices, including: Daily care of your teeth and gums. Regular physical activity. Eating a healthy diet. Avoiding tobacco and drug use. Limiting alcohol use. Practicing safe sex. Taking low-dose aspirin every day. Taking vitamin  and mineral supplements as recommended by your health care provider. What happens during an annual well check? The services and screenings done by your health care provider during your annual well check will depend on your age, overall health, lifestyle risk factors, and family history of disease. Counseling  Your health care provider may ask you questions about your: Alcohol use. Tobacco use. Drug use. Emotional well-being. Home and relationship well-being. Sexual activity. Eating habits. History of falls. Memory and ability to understand (cognition). Work and work Astronomer. Reproductive health. Screening  You may have the following tests or measurements: Height, weight, and BMI. Blood pressure. Lipid and cholesterol levels. These may be checked every 5 years, or more frequently if you are over 62 years old. Skin check. Lung cancer screening. You may have this screening every year starting at age 68 if you have a 30-pack-year history of smoking and currently smoke or have quit within the past 15 years. Fecal occult blood test (FOBT) of the stool. You may have this test every year starting at age 68. Flexible sigmoidoscopy or colonoscopy. You may have a sigmoidoscopy every 5 years or a colonoscopy every 10 years starting at age 65. Hepatitis C blood test. Hepatitis B blood test. Sexually transmitted disease (STD) testing. Diabetes screening. This is done by checking your blood sugar (glucose) after you have not eaten for a while (fasting). You may have this  done every 1-3 years. Bone density scan. This is done to screen for osteoporosis. You may have this done starting at age 92. Mammogram. This may be done every 1-2 years. Talk to your health care provider about how often you should have regular mammograms. Talk with your health care provider about your test results, treatment options, and if necessary, the need for more tests. Vaccines  Your health care provider may recommend  certain vaccines, such as: Influenza vaccine. This is recommended every year. Tetanus, diphtheria, and acellular pertussis (Tdap, Td) vaccine. You may need a Td booster every 10 years. Zoster vaccine. You may need this after age 63. Pneumococcal 13-valent conjugate (PCV13) vaccine. One dose is recommended after age 63. Pneumococcal polysaccharide (PPSV23) vaccine. One dose is recommended after age 7. Talk to your health care provider about which screenings and vaccines you need and how often you need them. This information is not intended to replace advice given to you by your health care provider. Make sure you discuss any questions you have with your health care provider. Document Released: 04/06/2015 Document Revised: 11/28/2015 Document Reviewed: 01/09/2015 Elsevier Interactive Patient Education  2017 [employer].  Fall Prevention in the Home Falls can cause injuries. They can happen to people of all ages. There are many things you can do to make your home safe and to help prevent falls. What can I do on the outside of my home? Regularly fix the edges of walkways and driveways and fix any cracks. Remove anything that might make you trip as you walk through a door, such as a raised step or threshold. Trim any bushes or trees on the path to your home. Use bright outdoor lighting. Clear any walking paths of anything that might make someone trip, such as rocks or tools. Regularly check to see if handrails are loose or broken. Make sure that both sides of any steps have handrails. Any raised decks and porches should have guardrails on the edges. Have any leaves, snow, or ice cleared regularly. Use sand or salt on walking paths during winter. Clean up any spills in your garage right away. This includes oil or grease spills. What can I do in the bathroom? Use night lights. Install grab bars by the toilet and in the tub and shower. Do not use towel bars as grab bars. Use non-skid mats or  decals in the tub or shower. If you need to sit down in the shower, use a plastic, non-slip stool. Keep the floor dry. Clean up any water that spills on the floor as soon as it happens. Remove soap buildup in the tub or shower regularly. Attach bath mats securely with double-sided non-slip rug tape. Do not have throw rugs and other things on the floor that can make you trip. What can I do in the bedroom? Use night lights. Make sure that you have a light by your bed that is easy to reach. Do not use any sheets or blankets that are too big for your bed. They should not hang down onto the floor. Have a firm chair that has side arms. You can use this for support while you get dressed. Do not have throw rugs and other things on the floor that can make you trip. What can I do in the kitchen? Clean up any spills right away. Avoid walking on wet floors. Keep items that you use a lot in easy-to-reach places. If you need to reach something above you, use a strong step stool that  has a grab bar. Keep electrical cords out of the way. Do not use floor polish or wax that makes floors slippery. If you must use wax, use non-skid floor wax. Do not have throw rugs and other things on the floor that can make you trip. What can I do with my stairs? Do not leave any items on the stairs. Make sure that there are handrails on both sides of the stairs and use them. Fix handrails that are broken or loose. Make sure that handrails are as long as the stairways. Check any carpeting to make sure that it is firmly attached to the stairs. Fix any carpet that is loose or worn. Avoid having throw rugs at the top or bottom of the stairs. If you do have throw rugs, attach them to the floor with carpet tape. Make sure that you have a light switch at the top of the stairs and the bottom of the stairs. If you do not have them, ask someone to add them for you. What else can I do to help prevent falls? Wear shoes that: Do not  have high heels. Have rubber bottoms. Are comfortable and fit you well. Are closed at the toe. Do not wear sandals. If you use a stepladder: Make sure that it is fully opened. Do not climb a closed stepladder. Make sure that both sides of the stepladder are locked into place. Ask someone to hold it for you, if possible. Clearly mark and make sure that you can see: Any grab bars or handrails. First and last steps. Where the edge of each step is. Use tools that help you move around (mobility aids) if they are needed. These include: Canes. Walkers. Scooters. Crutches. Turn on the lights when you go into a dark area. Replace any light bulbs as soon as they burn out. Set up your furniture so you have a clear path. Avoid moving your furniture around. If any of your floors are uneven, fix them. If there are any pets around you, be aware of where they are. Review your medicines with your doctor. Some medicines can make you feel dizzy. This can increase your chance of falling. Ask your doctor what other things that you can do to help prevent falls. This information is not intended to replace advice given to you by your health care provider. Make sure you discuss any questions you have with your health care provider. Document Released: 01/04/2009 Document Revised: 08/16/2015 Document Reviewed: 04/14/2014 Elsevier Interactive Patient Education  2017 [employer].

## 2022-08-21 NOTE — Progress Notes (Signed)
I connected with  Linda [name] on 08/21/22 by a audio enabled telemedicine application and verified that I am speaking with the correct person using two identifiers.  Patient Location: Home  Provider Location: Office/Clinic  I discussed the limitations of evaluation and management by telemedicine. The patient expressed understanding and agreed to proceed.  Subjective:   Linda [name] is a 72 y.o. female who presents for Medicare Annual (Subsequent) preventive examination.  Review of Systems     Cardiac Risk Factors include: advanced age (>47men, >40 women)     Objective:    Today's Vitals   08/21/22 1516  Weight: 125 lb (56.7 kg)  Height: 5\' 4"  (1.626 m)   Body mass index is 21.46 kg/m.     08/21/2022    3:19 PM 09/11/2021    3:45 PM 09/10/2020    9:05 AM 09/03/2020   10:24 AM 08/29/2020    2:13 PM 08/08/2020   10:36 AM 08/28/2019    3:58 PM  Advanced Directives  Does Patient Have a Medical Advance Directive? Yes No  No No No No  Type of Estate agent of [place];Living will        Copy of Healthcare Power of Attorney in Chart? No - copy requested        Would patient like information on creating a medical advance directive?  No - Patient declined No - Patient declined No - Patient declined No - Patient declined No - Patient declined No - Patient declined    Current Medications (verified) Outpatient Encounter Medications as of 08/21/2022  Medication Sig   Ascorbic Acid (VITAMIN C) 1000 MG tablet Take 1,000 mg by mouth daily.   Calcium Carb-Cholecalciferol (CALCIUM 1000 + D PO) Take by mouth daily.   cholecalciferol (VITAMIN D) 1000 units tablet Take 1.5 tablets (1,500 Units total) by mouth daily.   tretinoin (RETIN-A) 0.05 % cream APPLY A PEASIZE AMOUNT ONTO THE FACE NIGHTLY (Patient not taking: Reported on 08/21/2022)   No facility-administered encounter medications on file as of 08/21/2022.    Allergies (verified) Fosamax [alendronate]    History: Past Medical History:  Diagnosis Date   Cellulitis of leg, right 08/27/2016   Hyperlipidemia    Osteopenia    Pneumonia    Vitamin D deficiency    Past Surgical History:  Procedure Laterality Date   COLONOSCOPY  03/2011   Dr Brodie;diminuitive polyp   G 2 P 2     TONSILLECTOMY     Family History  Problem Relation Age of Onset   Melanoma Father    Hyperlipidemia Mother    Heart disease Mother        no MI   Diabetes Paternal Grandmother        vision loss   Diabetes Maternal Uncle    Colon cancer Neg Hx    Stomach cancer Neg Hx    Esophageal cancer Neg Hx    Rectal cancer Neg Hx    Stroke Neg Hx    Social History   Socioeconomic History   Marital status: Married    Spouse name: Not on file   Number of children: 2   Years of education: Not on file   Highest education level: Not on file  Occupational History   Occupation: banking  Tobacco Use   Smoking status: Never   Smokeless tobacco: Never  Vaping Use   Vaping Use: Never used  Substance and Sexual Activity   Alcohol use: Yes    Comment:  1-2 wine or beer nightly   Drug use: No   Sexual activity: Not on file  Other Topics Concern   Not on file  Social History Narrative   Exercise:  Yoga, weights, walking      Two children, grandchildren   Social Determinants of Health   Financial Resource Strain: Low Risk  (08/21/2022)   Overall Financial Resource Strain (CARDIA)    Difficulty of Paying Living Expenses: Not hard at all  Food Insecurity: No Food Insecurity (08/21/2022)   Hunger Vital Sign    Worried About Running Out of Food in the Last Year: Never true    Ran Out of Food in the Last Year: Never true  Transportation Needs: No Transportation Needs (08/21/2022)   PRAPARE - Administrator, Civil Service (Medical): No    Lack of Transportation (Non-Medical): No  Physical Activity: Sufficiently Active (08/21/2022)   Exercise Vital Sign    Days of Exercise per Week: 7 days     Minutes of Exercise per Session: 150+ min  Stress: No Stress Concern Present (08/21/2022)   [employer] of Occupational Health - Occupational Stress Questionnaire    Feeling of Stress : Not at all  Social Connections: Socially Integrated (09/11/2021)   Social Connection and Isolation Panel [NHANES]    Frequency of Communication with Friends and Family: More than three times a week    Frequency of Social Gatherings with Friends and Family: More than three times a week    Attends Religious Services: More than 4 times per year    Active Member of [employer] or Organizations: No    Attends Engineer, structural: More than 4 times per year    Marital Status: Married    Tobacco Counseling Counseling given: Not Answered   Clinical Intake:  Pre-visit preparation completed: Yes  Pain : No/denies pain     Nutritional Status: BMI of 19-24  Normal Nutritional Risks: None Diabetes: No  How often do you need to have someone help you when you read instructions, pamphlets, or other written materials from your doctor or pharmacy?: 1 - Never  Diabetic? no  Interpreter Needed?: No  Information entered by :: NAllen LPN   Activities of Daily Living    08/21/2022    3:20 PM 09/11/2021    3:46 PM  In your present state of health, do you have any difficulty performing the following activities:  Hearing? 0 0  Vision? 0 0  Difficulty concentrating or making decisions? 0 0  Walking or climbing stairs? 0 0  Dressing or bathing? 0 0  Doing errands, shopping? 0 0  Preparing Food and eating ? N N  Using the Toilet? N N  In the past six months, have you accidently leaked urine? N N  Do you have problems with loss of bowel control? N N  Managing your Medications? N N  Managing your Finances? N N  Housekeeping or managing your Housekeeping? N N    Patient Care Team: Pincus Sanes, MD as PCP - General (Internal Medicine) Ernesto Rutherford, MD as Referring Physician  (Ophthalmology)  Indicate any recent Medical Services you may have received from other than [facility] providers in the past year (date may be approximate).     Assessment:   This is a routine wellness examination for [name].  Hearing/Vision screen Vision Screening - Comments:: Regular eye exams, [facility]  Dietary issues and exercise activities discussed: Current Exercise Habits: Structured exercise class, Type of exercise: calisthenics;strength  training/weights, Time (Minutes): > 60, Frequency (Times/Week): 7, Weekly Exercise (Minutes/Week): 0   Goals Addressed             This Visit's Progress    Patient Stated       08/21/2022, maintain current health       Depression Screen    08/21/2022    3:20 PM 09/11/2021    3:38 PM 08/20/2021    8:21 AM 09/10/2020    9:21 AM 08/14/2020   12:36 PM 08/03/2019    8:58 AM 01/27/2017    8:11 AM  PHQ 2/9 Scores  PHQ - 2 Score 0 0 0 0 1 0 0  PHQ- 9 Score   1  5      Fall Risk    08/21/2022    3:20 PM 08/20/2021    8:21 AM 09/10/2020    9:06 AM 08/14/2020    9:01 AM 08/03/2019    9:56 AM  Fall Risk   Falls in the past year? 0 0 0 0 0  Number falls in past yr: 0 0 0 0 0  Injury with Fall? 0 0 0 0 0  Risk for fall due to : No Fall Risks No Fall Risks No Fall Risks No Fall Risks No Fall Risks  Follow up Falls prevention discussed;Education provided;Falls evaluation completed Falls evaluation completed Falls evaluation completed Falls evaluation completed Falls evaluation completed;Education provided;Falls prevention discussed    FALL RISK PREVENTION PERTAINING TO THE HOME:  Any stairs in or around the home? Yes  If so, are there any without handrails? No  Home free of loose throw rugs in walkways, pet beds, electrical cords, etc? Yes  Adequate lighting in your home to reduce risk of falls? Yes   ASSISTIVE DEVICES UTILIZED TO PREVENT FALLS:  Life alert? No  Use of a cane, walker or w/c? No  Grab bars in the bathroom? No  Shower  chair or bench in shower? No  Elevated toilet seat or a handicapped toilet? Yes   TIMED UP AND GO:  Was the test performed? No .      Cognitive Function:        08/21/2022    3:20 PM 09/11/2021    3:47 PM 08/03/2019   10:00 AM  6CIT Screen  What Year? 0 points 0 points 0 points  What month? 0 points 0 points 0 points  What time? 0 points 0 points 0 points  Count back from 20 0 points 0 points 0 points  Months in reverse 2 points 0 points 0 points  Repeat phrase 2 points 0 points 0 points  Total Score 4 points 0 points 0 points    Immunizations Immunization History  Administered Date(s) Administered   COVID-19, mRNA, vaccine(Comirnaty)12 years and older 12/27/2021   Fluad Quad(high Dose 65+) 11/18/2018   Influenza, High Dose Seasonal PF 04/30/2016, 12/06/2016, 12/15/2017, 12/29/2019   Influenza,inj,Quad PF,6+ Mos 01/19/2015   Influenza-Unspecified 12/22/2013, 12/06/2016, 11/17/2019   PFIZER(Purple Top)SARS-COV-2 Vaccination 04/12/2019, 05/02/2019, 12/29/2019, 12/14/2020   Pneumococcal Conjugate-13 10/17/2015   Pneumococcal Polysaccharide-23 01/27/2017   Td 11/22/2004   Tdap 10/03/2015   Zoster, Live 10/03/2015    TDAP status: Up to date  Flu Vaccine status: Up to date  Pneumococcal vaccine status: Up to date  Covid-19 vaccine status: Completed vaccines  Qualifies for Shingles Vaccine? Yes   Zostavax completed Yes   Shingrix Completed?: No.    Education has been provided regarding the importance of this vaccine. Patient has been advised  to call insurance company to determine out of pocket expense if they have not yet received this vaccine. Advised may also receive vaccine at local pharmacy or Health Dept. Verbalized acceptance and understanding.  Screening Tests Health Maintenance  Topic Date Due   Zoster Vaccines- Shingrix (1 of 2) Never done   MAMMOGRAM  08/19/2020   Colonoscopy  04/17/2021   Medicare Annual Wellness (AWV)  09/12/2022   INFLUENZA VACCINE   10/23/2022   DEXA SCAN  08/09/2023   DTaP/Tdap/Td (3 - Td or Tdap) 10/02/2025   Pneumonia Vaccine 2+ Years old  Completed   COVID-19 Vaccine  Completed   Hepatitis C Screening  Completed   HPV VACCINES  Aged Out    Health Maintenance  Health Maintenance Due  Topic Date Due   Zoster Vaccines- Shingrix (1 of 2) Never done   MAMMOGRAM  08/19/2020   Colonoscopy  04/17/2021   Medicare Annual Wellness (AWV)  09/12/2022    Colorectal cancer screening: Referral to GI placed today. Pt aware the office will call re: appt.  Mammogram status: Ordered 12/13/2021. Pt provided with contact info and advised to call to schedule appt.   Bone Density status: Completed 08/08/2021.   Lung Cancer Screening: (Low Dose CT Chest recommended if Age 31-80 years, 30 pack-year currently smoking OR have quit w/in 15years.) does not qualify.   Lung Cancer Screening Referral: no  Additional Screening:  Hepatitis C Screening: does qualify; Completed 10/04/2015  Vision Screening: Recommended annual ophthalmology exams for early detection of glaucoma and other disorders of the eye. Is the patient up to date with their annual eye exam?  Yes  Who is the provider or what is the name of the office in which the patient attends annual eye exams? [facility] If pt is not established with a provider, would they like to be referred to a provider to establish care? No .   Dental Screening: Recommended annual dental exams for proper oral hygiene  Community Resource Referral / Chronic Care Management: CRR required this visit?  No   CCM required this visit?  No      Plan:     I have personally reviewed and noted the following in the patient's chart:   Medical and social history Use of alcohol, tobacco or illicit drugs  Current medications and supplements including opioid prescriptions. Patient is not currently taking opioid prescriptions. Functional ability and status Nutritional status Physical  activity Advanced directives List of other physicians Hospitalizations, surgeries, and ER visits in previous 12 months Vitals Screenings to include cognitive, depression, and falls Referrals and appointments  In addition, I have reviewed and discussed with patient certain preventive protocols, quality metrics, and best practice recommendations. A written personalized care plan for preventive services as well as general preventive health recommendations were provided to patient.     Barb Merino, LPN   1/61/0960   Nurse Notes: none  Due to this being a virtual visit, the after visit summary with patients personalized plan was offered to patient via mail or my-chart. Patient would like to access on my-chart

## 2022-08-24 NOTE — Patient Instructions (Addendum)
[facility] [address] [place] [address] [phone]    Blood work was ordered.   The lab is on the first floor.    Medications changes include :   none.  Increase calcium intake     Return in about 1 year (around 08/25/2023) for Physical Exam.   Health Maintenance, Female Adopting a healthy lifestyle and getting preventive care are important in promoting health and wellness. Ask your health care provider about: The right schedule for you to have regular tests and exams. Things you can do on your own to prevent diseases and keep yourself healthy. What should I know about diet, weight, and exercise? Eat a healthy diet  Eat a diet that includes plenty of vegetables, fruits, low-fat dairy products, and lean protein. Do not eat a lot of foods that are high in solid fats, added sugars, or sodium. Maintain a healthy weight Body mass index (BMI) is used to identify weight problems. It estimates body fat based on height and weight. Your health care provider can help determine your BMI and help you achieve or maintain a healthy weight. Get regular exercise Get regular exercise. This is one of the most important things you can do for your health. Most adults should: Exercise for at least 150 minutes each week. The exercise should increase your heart rate and make you sweat (moderate-intensity exercise). Do strengthening exercises at least twice a week. This is in addition to the moderate-intensity exercise. Spend less time sitting. Even light physical activity can be beneficial. Watch cholesterol and blood lipids Have your blood tested for lipids and cholesterol at 72 years of age, then have this test every 5 years. Have your cholesterol levels checked more often if: Your lipid or cholesterol levels are high. You are older than 72 years of age. You are at high risk for heart disease. What should I know about cancer screening? Depending on your  health history and family history, you may need to have cancer screening at various ages. This may include screening for: Breast cancer. Cervical cancer. Colorectal cancer. Skin cancer. Lung cancer. What should I know about heart disease, diabetes, and high blood pressure? Blood pressure and heart disease High blood pressure causes heart disease and increases the risk of stroke. This is more likely to develop in people who have high blood pressure readings or are overweight. Have your blood pressure checked: Every 3-5 years if you are 59-5 years of age. Every year if you are 71 years old or older. Diabetes Have regular diabetes screenings. This checks your fasting blood sugar level. Have the screening done: Once every three years after age 52 if you are at a normal weight and have a low risk for diabetes. More often and at a younger age if you are overweight or have a high risk for diabetes. What should I know about preventing infection? Hepatitis B If you have a higher risk for hepatitis B, you should be screened for this virus. Talk with your health care provider to find out if you are at risk for hepatitis B infection. Hepatitis C Testing is recommended for: Everyone born from [birth year] through 1965. Anyone with known risk factors for hepatitis C. Sexually transmitted infections (STIs) Get screened for STIs, including gonorrhea and chlamydia, if: You are sexually active and are younger than 72 years of age. You are older than 72 years of age and your health care provider tells you that you are at risk  for this type of infection. Your sexual activity has changed since you were last screened, and you are at increased risk for chlamydia or gonorrhea. Ask your health care provider if you are at risk. Ask your health care provider about whether you are at high risk for HIV. Your health care provider may recommend a prescription medicine to help prevent HIV infection. If you choose to take  medicine to prevent HIV, you should first get tested for HIV. You should then be tested every 3 months for as long as you are taking the medicine. Pregnancy If you are about to stop having your period (premenopausal) and you may become pregnant, seek counseling before you get pregnant. Take 400 to 800 micrograms (mcg) of folic acid every day if you become pregnant. Ask for birth control (contraception) if you want to prevent pregnancy. Osteoporosis and menopause Osteoporosis is a disease in which the bones lose minerals and strength with aging. This can result in bone fractures. If you are 21 years old or older, or if you are at risk for osteoporosis and fractures, ask your health care provider if you should: Be screened for bone loss. Take a calcium or vitamin D supplement to lower your risk of fractures. Be given hormone replacement therapy (HRT) to treat symptoms of menopause. Follow these instructions at home: Alcohol use Do not drink alcohol if: Your health care provider tells you not to drink. You are pregnant, may be pregnant, or are planning to become pregnant. If you drink alcohol: Limit how much you have to: 0-1 drink a day. Know how much alcohol is in your drink. In the [place] one drink equals one 12 oz bottle of beer (355 mL), one 5 oz glass of wine (148 mL), or one 1 oz glass of hard liquor (44 mL). Lifestyle Do not use any products that contain nicotine or tobacco. These products include cigarettes, chewing tobacco, and vaping devices, such as e-cigarettes. If you need help quitting, ask your health care provider. Do not use street drugs. Do not share needles. Ask your health care provider for help if you need support or information about quitting drugs. General instructions Schedule regular health, dental, and eye exams. Stay current with your vaccines. Tell your health care provider if: You often feel depressed. You have ever been abused or do not feel safe at  home. Summary Adopting a healthy lifestyle and getting preventive care are important in promoting health and wellness. Follow your health care provider's instructions about healthy diet, exercising, and getting tested or screened for diseases. Follow your health care provider's instructions on monitoring your cholesterol and blood pressure. This information is not intended to replace advice given to you by your health care provider. Make sure you discuss any questions you have with your health care provider. Document Revised: 07/30/2020 Document Reviewed: 07/30/2020 Elsevier Patient Education  2024 [employer].

## 2022-08-24 NOTE — Progress Notes (Unsigned)
Subjective:    Patient ID: Linda [name], female    DOB: [birth year], 72 y.o.   MRN: [id number]      HPI Tatayana is here for a Physical exam and her chronic medical problems.    Doing well-no concerns.   Medications and allergies reviewed with patient and updated if appropriate.  Current Outpatient Medications on File Prior to Visit  Medication Sig Dispense Refill   Ascorbic Acid (VITAMIN C) 1000 MG tablet Take 1,000 mg by mouth daily.     Calcium Carb-Cholecalciferol (CALCIUM 1000 + D PO) Take by mouth daily.     cholecalciferol (VITAMIN D) 1000 units tablet Take 1.5 tablets (1,500 Units total) by mouth daily. 90 tablet 2   No current facility-administered medications on file prior to visit.    Review of Systems  Constitutional:  Negative for fever.  Eyes:  Negative for visual disturbance.  Respiratory:  Negative for cough, shortness of breath and wheezing.   Cardiovascular:  Negative for chest pain, palpitations and leg swelling.  Gastrointestinal:  Negative for abdominal pain, blood in stool, constipation and diarrhea.       No gerd  Genitourinary:  Negative for dysuria.  Musculoskeletal:  Positive for arthralgias (mild). Negative for back pain.  Skin:  Negative for rash.  Neurological:  Negative for light-headedness and headaches.  Psychiatric/Behavioral:  Negative for dysphoric mood. The patient is nervous/anxious.        Objective:   Vitals:   08/25/22 0759  BP: 122/76  Pulse: 75  Temp: 98 F (36.7 C)  SpO2: 99%   Filed Weights   08/25/22 0759  Weight: 128 lb (58.1 kg)   Body mass index is 21.97 kg/m.  BP Readings from Last 3 Encounters:  08/25/22 122/76  07/30/22 124/80  08/20/21 126/72    Wt Readings from Last 3 Encounters:  08/25/22 128 lb (58.1 kg)  08/21/22 125 lb (56.7 kg)  07/30/22 127 lb (57.6 kg)       Physical Exam Constitutional: She appears well-developed and well-nourished. No distress.  HENT:  Head: Normocephalic and  atraumatic.  Right Ear: External ear normal. Normal ear canal and TM Left Ear: External ear normal.  Normal ear canal and TM Mouth/Throat: Oropharynx is clear and moist.  Eyes: Conjunctivae normal.  Neck: Neck supple. No tracheal deviation present. No thyromegaly present.  No carotid bruit  Cardiovascular: Normal rate, regular rhythm and normal heart sounds.   No murmur heard.  No edema. Pulmonary/Chest: Effort normal and breath sounds normal. No respiratory distress. She has no wheezes. She has no rales.  Breast: deferred   Abdominal: Soft. She exhibits no distension. There is no tenderness.  Lymphadenopathy: She has no cervical adenopathy.  Skin: Skin is warm and dry. She is not diaphoretic.  Psychiatric: She has a normal mood and affect. Her behavior is normal.     Lab Results  Component Value Date   WBC 4.0 08/15/2021   HGB 12.1 08/15/2021   HCT 35.5 (L) 08/15/2021   PLT 235.0 08/15/2021   GLUCOSE 109 (H) 08/15/2021   CHOL 182 08/15/2021   TRIG 66.0 08/15/2021   HDL 82.50 08/15/2021   LDLDIRECT 76.4 04/07/2011   LDLCALC 87 08/15/2021   ALT 8 08/15/2021   AST 13 08/15/2021   NA 139 08/15/2021   K 4.0 08/15/2021   CL 102 08/15/2021   CREATININE 0.53 08/15/2021   BUN 13 08/15/2021   CO2 30 08/15/2021   TSH 1.92 08/15/2021   HGBA1C 5.7  08/15/2021         Assessment & Plan:   Physical exam: Screening blood work  ordered Exercise  regular Weight  normal Substance abuse  none   Reviewed recommended immunizations.   Health Maintenance  Topic Date Due   Zoster Vaccines- Shingrix (1 of 2) Never done   MAMMOGRAM  08/19/2020   Colonoscopy  04/17/2021   INFLUENZA VACCINE  10/23/2022   DEXA SCAN  08/09/2023   Medicare Annual Wellness (AWV)  08/21/2023   DTaP/Tdap/Td (3 - Td or Tdap) 10/02/2025   Pneumonia Vaccine 35+ Years old  Completed   COVID-19 Vaccine  Completed   Hepatitis C Screening  Completed   HPV VACCINES  Aged Out      Colonoscopy due - will  do cologuard    See Problem List for Assessment and Plan of chronic medical problems.

## 2022-08-25 ENCOUNTER — Ambulatory Visit (INDEPENDENT_AMBULATORY_CARE_PROVIDER_SITE_OTHER): Payer: Medicare HMO | Admitting: Internal Medicine

## 2022-08-25 ENCOUNTER — Encounter: Payer: Self-pay | Admitting: Internal Medicine

## 2022-08-25 VITALS — BP 122/76 | HR 75 | Temp 98.0°F | Ht 64.0 in | Wt 128.0 lb

## 2022-08-25 DIAGNOSIS — E559 Vitamin D deficiency, unspecified: Secondary | ICD-10-CM

## 2022-08-25 DIAGNOSIS — M810 Age-related osteoporosis without current pathological fracture: Secondary | ICD-10-CM | POA: Diagnosis not present

## 2022-08-25 DIAGNOSIS — Z0000 Encounter for general adult medical examination without abnormal findings: Secondary | ICD-10-CM

## 2022-08-25 DIAGNOSIS — H3530 Unspecified macular degeneration: Secondary | ICD-10-CM | POA: Diagnosis not present

## 2022-08-25 DIAGNOSIS — R7303 Prediabetes: Secondary | ICD-10-CM | POA: Diagnosis not present

## 2022-08-25 LAB — LIPID PANEL
Cholesterol: 212 mg/dL — ABNORMAL HIGH (ref 0–200)
HDL: 102.9 mg/dL (ref >39.00)
LDL Cholesterol: 96 mg/dL (ref 0–99)
NonHDL: 108.75
Total CHOL/HDL Ratio: 2
Triglycerides: 65 mg/dL (ref 0.0–149.0)
VLDL: 13 mg/dL (ref 0.0–40.0)

## 2022-08-25 LAB — CBC WITH DIFFERENTIAL/PLATELET
Basophils Absolute: 0 10*3/uL (ref 0.0–0.1)
Basophils Relative: 0.6 % (ref 0.0–3.0)
Eosinophils Absolute: 0.1 10*3/uL (ref 0.0–0.7)
Eosinophils Relative: 1.5 % (ref 0.0–5.0)
HCT: 40.4 % (ref 36.0–46.0)
Hemoglobin: 13.7 g/dL (ref 12.0–15.0)
Lymphocytes Relative: 34.3 % (ref 12.0–46.0)
Lymphs Abs: 1.5 10*3/uL (ref 0.7–4.0)
MCHC: 33.9 g/dL (ref 30.0–36.0)
MCV: 89.3 fl (ref 78.0–100.0)
Monocytes Absolute: 0.5 10*3/uL (ref 0.1–1.0)
Monocytes Relative: 10.4 % (ref 3.0–12.0)
Neutro Abs: 2.3 10*3/uL (ref 1.4–7.7)
Neutrophils Relative %: 53.2 % (ref 43.0–77.0)
Platelets: 229 10*3/uL (ref 150.0–400.0)
RBC: 4.52 Mil/uL (ref 3.87–5.11)
RDW: 13.9 % (ref 11.5–15.5)
WBC: 4.3 10*3/uL (ref 4.0–10.5)

## 2022-08-25 LAB — COMPREHENSIVE METABOLIC PANEL
ALT: 10 U/L (ref 0–35)
AST: 18 U/L (ref 0–37)
Albumin: 4.3 g/dL (ref 3.5–5.2)
Alkaline Phosphatase: 51 U/L (ref 39–117)
BUN: 15 mg/dL (ref 6–23)
CO2: 31 mEq/L (ref 19–32)
Calcium: 9.7 mg/dL (ref 8.4–10.5)
Chloride: 99 mEq/L (ref 96–112)
Creatinine, Ser: 0.65 mg/dL (ref 0.40–1.20)
GFR: 88.2 mL/min (ref >60.00)
Glucose, Bld: 99 mg/dL (ref 70–99)
Potassium: 4.2 mEq/L (ref 3.5–5.1)
Sodium: 138 mEq/L (ref 135–145)
Total Bilirubin: 0.6 mg/dL (ref 0.2–1.2)
Total Protein: 7.1 g/dL (ref 6.0–8.3)

## 2022-08-25 LAB — VITAMIN D 25 HYDROXY (VIT D DEFICIENCY, FRACTURES): VITD: 35.01 ng/mL (ref 30.00–100.00)

## 2022-08-25 LAB — HEMOGLOBIN A1C: Hgb A1c MFr Bld: 5.5 % (ref 4.6–6.5)

## 2022-08-25 NOTE — Assessment & Plan Note (Signed)
Chronic Lab Results  Component Value Date   HGBA1C 5.7 08/15/2021   Sugars just barely in prediabetic range Continue regular exercise Continue healthy diet 

## 2022-08-25 NOTE — Assessment & Plan Note (Addendum)
Chronic DEXA up-to-date Does not want to do any treatment-discussed Prolia, Fosamax, Reclast Continue calcium and vitamin D - inc calcium intake Continue regular exercise

## 2022-08-25 NOTE — Assessment & Plan Note (Signed)
Chronic Continue vitamin D daily Check vitamin D level

## 2022-08-27 DIAGNOSIS — R1310 Dysphagia, unspecified: Secondary | ICD-10-CM | POA: Diagnosis not present

## 2022-08-27 DIAGNOSIS — J301 Allergic rhinitis due to pollen: Secondary | ICD-10-CM | POA: Diagnosis not present

## 2022-08-27 DIAGNOSIS — R09A2 Foreign body sensation, throat: Secondary | ICD-10-CM | POA: Diagnosis not present

## 2022-09-02 DIAGNOSIS — Z1211 Encounter for screening for malignant neoplasm of colon: Secondary | ICD-10-CM | POA: Diagnosis not present

## 2022-09-09 LAB — COLOGUARD: COLOGUARD: NEGATIVE

## 2022-09-10 ENCOUNTER — Encounter: Payer: Self-pay | Admitting: Family Medicine

## 2022-09-10 ENCOUNTER — Other Ambulatory Visit: Payer: Self-pay

## 2022-09-10 ENCOUNTER — Ambulatory Visit: Payer: Medicare HMO | Admitting: Family Medicine

## 2022-09-10 VITALS — BP 112/82 | HR 76 | Ht 64.0 in | Wt 128.6 lb

## 2022-09-10 DIAGNOSIS — M25561 Pain in right knee: Secondary | ICD-10-CM | POA: Diagnosis not present

## 2022-09-10 DIAGNOSIS — M25512 Pain in left shoulder: Secondary | ICD-10-CM

## 2022-09-10 DIAGNOSIS — G8929 Other chronic pain: Secondary | ICD-10-CM

## 2022-09-10 DIAGNOSIS — M25511 Pain in right shoulder: Secondary | ICD-10-CM | POA: Diagnosis not present

## 2022-09-10 DIAGNOSIS — M25562 Pain in left knee: Secondary | ICD-10-CM | POA: Diagnosis not present

## 2022-09-10 NOTE — Patient Instructions (Addendum)
Thank you for coming in today.   I've referred you to Physical Therapy.  Let us know if you don't hear from them in one week.   Please use Voltaren gel (Generic Diclofenac Gel) up to 4x daily for pain as needed.  This is available over-the-counter as both the name brand Voltaren gel and the generic diclofenac gel.   Recommend compression, especially for 30 minutes after activity.  Check back in 8 weeks

## 2022-09-10 NOTE — Progress Notes (Unsigned)
I, Stevenson Clinch, CMA acting as a scribe for [name], MD.  Linda [name] is a 72 y.o. female who presents to [facility] at [facility] today for exacerbation of her bilat shoulder and knee pain. Pt was last seen by Dr. Denyse Amass on 10/08/20 and was advised to cont PT, of which she completed 1 additional visit and canceled all remaining. She no-showed her 54-month f/u w/ Dr. Denyse Amass on 12/10/20.  Today, pt reports worsening knee sx over the past few months. Pain at medial aspect of the knee. Pt is very active. Uses support during activity. No visible swelling. Denies mechanical sx. Denies radicular sx. Has not tried any meds for the pain.   Pt is RHD. C/O bilat shoulder pain. Hx of frozen shoulder. Notes decreased ROM in the shoulders. Described as tightness radiating into the traps. Denies radicular sx. Notes chronic neck pain/soreness and deceased ROM. Denie weakness/decreased grip stegh B UE. Pt notes that she was released from PT by Lauren.   Dx testing: 08/27/20 R & L shoulder XR             08/27/20 Labs (sed rate & C-reactive protein)  Pertinent review of systems: No fevers or chills  Relevant historical information: Osteoporosis History of polymyalgia rheumatica currently in remission  Exam:  BP 112/82   Pulse 76   Ht 5\' 4"  (1.626 m)   Wt 128 lb 9.6 oz (58.3 kg)   SpO2 97%   BMI 22.07 kg/m  General: Well Developed, well nourished, and in no acute distress.   MSK: C-spine: Normal appearing. Nontender to palpation spinal midline. Tender palpation cervical paraspinal musculature. Normal cervical motion. Impression right strength is intact. . T-spine nontender midline. To palpation rhomboids bilaterally.  Bilateral shoulders: Normal appearing Normal motion. Intact strength. Positive Hawkins and Neer's test.  Bilateral knees normal appearing Normal motion. Tender palpation medial joint line. Stable ligamentous exam. Intact strength.  Lab and Radiology  Results  Diagnostic Limited MSK Ultrasound of: Bilateral knees Left knee: No joint effusion.  Intact patellar and quad tendon. Mild degeneration medial joint line. No Baker's cyst. Right knee: Mild effusion present at superior patellar space.  Intact quad and patellar tendon. Mild degeneration medial joint line. No Baker's cyst. Impression: Mild degenerative changes medial compartment bilateral knees.  Diagnostic Limited MSK Ultrasound of: Left shoulder Intact biceps tendon. Subtle calcific change present at supraspinatus tendon without visible tear. Mild subacromial bursitis is present. Normal-appearing AC joint. 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     Assessment and Plan: 72 y.o. female with pain present in the bilateral shoulders and rhomboid region bilateral shoulders thought to be due to shoulder impingement and periscapular muscle dysfunction.  She is a good candidate for physical therapy.  Plan for physical therapy referral.  If not better consider repeat x-ray.  Last shoulder x-rays were 2 years ago.  Bilateral knee pain thought to be exacerbation of DJD and likely some patellofemoral pain syndrome.  She is a good candidate again for physical therapy.  If not better on recheck consider x-ray and potentially injection.  She would like to avoid x-rays today.   PDMP not reviewed this encounter. Orders Placed This Encounter  Procedures   [place] LIMITED JOINT SPACE STRUCTURES UP LEFT(NO LINKED CHARGES)    Order Specific Question:   Reason for Exam (SYMPTOM  OR DIAGNOSIS REQUIRED)    Answer:   shoulder and knee    Order Specific Question:   Preferred imaging location?  Answer:   [facility] referral to Physical Therapy    Referral Priority:   Routine    Referral Type:   Physical Medicine    Referral Reason:   Specialty Services Required    Requested Specialty:   Physical Therapy    Number of Visits Requested:    1   No orders of the defined types were placed in this encounter.    Discussed warning signs or symptoms. Please see discharge instructions. Patient expresses understanding.   The above documentation has been reviewed and is accurate and complete [name], M.D.

## 2022-09-17 ENCOUNTER — Encounter: Payer: Self-pay | Admitting: Physical Therapy

## 2022-09-17 ENCOUNTER — Ambulatory Visit (INDEPENDENT_AMBULATORY_CARE_PROVIDER_SITE_OTHER): Payer: Medicare HMO | Admitting: Physical Therapy

## 2022-09-17 DIAGNOSIS — M25562 Pain in left knee: Secondary | ICD-10-CM

## 2022-09-17 DIAGNOSIS — G8929 Other chronic pain: Secondary | ICD-10-CM

## 2022-09-17 DIAGNOSIS — M25512 Pain in left shoulder: Secondary | ICD-10-CM

## 2022-09-17 DIAGNOSIS — M25511 Pain in right shoulder: Secondary | ICD-10-CM | POA: Diagnosis not present

## 2022-09-17 DIAGNOSIS — M25561 Pain in right knee: Secondary | ICD-10-CM | POA: Diagnosis not present

## 2022-09-17 NOTE — Therapy (Signed)
OUTPATIENT PHYSICAL THERAPY SHOULDER EVALUATION   Patient Name: Linda [name] MRN: [id number] DOB:1951-01-25, 72 y.o., female Today's Date: 09/17/2022  END OF SESSION:  PT End of Session - 09/17/22 2110     Visit Number 1    Number of Visits 16    Date for PT Re-Evaluation 11/12/22    Authorization Type [employer]    PT Start Time 0933    PT Stop Time 1015    PT Time Calculation (min) 42 min    Activity Tolerance Patient tolerated treatment well    Behavior During Therapy [facility] for tasks assessed/performed             Past Medical History:  Diagnosis Date   Cellulitis of leg, right 08/27/2016   Hyperlipidemia    Osteopenia    Pneumonia    Vitamin D deficiency    Past Surgical History:  Procedure Laterality Date   COLONOSCOPY  03/2011   Dr Brodie;diminuitive polyp   G 2 P 2     TONSILLECTOMY     Patient Active Problem List   Diagnosis Date Noted   Macular degeneration 08/25/2022   Pain with swallowing 07/30/2022   Deficiency anemia 04/03/2021   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 04/03/2021   Subacute cough 04/02/2021   Abnormal finding on CT scan 09/04/2020   Aneurysm artery, 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   PMR (polymyalgia rheumatica) (HCC) 02/23/2020   Loss of transverse plantar arch 10/19/2018   Neuroma of foot 10/19/2018   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   SI (sacroiliac) joint dysfunction 12/16/2017   Upper airway cough syndrome 11/17/2017   History of colonic polyps 05/22/2014   Prediabetes 09/04/2013   Allergic rhinitis 08/06/2013   Generalized anxiety disorder 11/29/2009   Vitamin D deficiency 04/10/2008   Osteoporosis 10/04/2007    PCP: Cheryll Cockayne  REFERRING PROVIDER: [name]   REFERRING DIAG: [name]   THERAPY DIAG:  Bilateral shoulder pain, unspecified chronicity  Chronic pain of both knees  Rationale for Evaluation and Treatment: Rehabilitation  ONSET DATE:   SUBJECTIVE:                                                                                                                                                                                       SUBJECTIVE STATEMENT: Pt states  shoulder pain, bilateral, ongoing for a while,  hurts with overhead motions, sleeping on them,  also feels knot in shoulder blade area.  Pt very active, likes to work out, likes classes. Sometimes does 2-3 classes per day.  Ie- Zumba, body balance, carido/weights.  Also states Bil knee pain, medially,  Wears wraps/compression sleeve for class, some pain with zumba. Knee pain  more constant than shoulders.  Hand dominance: Right  PERTINENT HISTORY: none    PAIN:  Are you having pain? Yes: NPRS scale: up to 6/10 Pain location: shoulders / bil  Pain description: intermittent, sore Aggravating factors: reaching overhead  Relieving factors: rest  Are you having pain? Yes: NPRS scale: up to 6/10 Pain location: knees/ bil Pain description: sore, more constant  Aggravating factors: increased activity , classess  Relieving factors: rest   PRECAUTIONS: None   WEIGHT BEARING RESTRICTIONS: No  FALLS:  Has patient fallen in last 6 months? No   PLOF: Independent  PATIENT GOALS:   NEXT MD VISIT:   OBJECTIVE:   DIAGNOSTIC FINDINGS:    PATIENT SURVEYS:    COGNITION: Overall cognitive status: Within functional limits for tasks assessed     SENSATION: WFL  POSTURE:   UPPER EXTREMITY ROM:   Shoulder flexion: mild limitation for end range flexion with PROM and AROM.  Mild limitation for end range PROM and AROM for abduction IR/ER: WFL  Knees: WFL Hips: WFL  UPPER EXTREMITY MMT:  MMT Right eval Left eval  Shoulder flexion 4 4  Shoulder extension    Shoulder abduction 4 4  Shoulder adduction    Shoulder internal rotation 4 4  Shoulder external rotation 4 4  Middle trapezius    Lower trapezius    Elbow flexion    Elbow extension    Wrist flexion    Wrist extension    Wrist ulnar deviation    Wrist radial deviation     Wrist pronation    Wrist supination    Grip strength (lbs)    (Blank rows = not tested)  SHOULDER SPECIAL TESTS:   JOINT MOBILITY TESTING:  Very mild hypomobility for shoulder flex and abd with mild soreness at end ranges.  Hypomobile T-spine  PALPATION:  Mild pain at medial knee, joint line, bilaterally  Minimal pain to palpate shoulders. Tightness in bil pecs, and UT s . Muscle trigger points in bil levator L>R.  Noted lipoma? In L rhomboid region (pt will ask dr about it at next appt).    TODAY'S TREATMENT:                                                                                                                                         DATE:   09/17/22:  Therapeutic Exercise: Aerobic: Supine:  shoulder flexion/cane x 15 Seated:  Standing:  shoulder ER RTB 2 x 10;  Stretches:   supine pec stretch on table 30 sec x 2, discussed using noodle or yoga block for more stretch;   cat cow for t-spine motion x 15, 3 sec holds.  Neuromuscular Re-education: Manual Therapy: Therapeutic Activity: Self Care:   PATIENT EDUCATION:  Education details: PT POC, Exam findings, HEP Person educated: Patient Education method: Explanation, Demonstration, Tactile cues, Verbal cues, and Handouts Education comprehension: verbalized understanding, returned demonstration,  verbal cues required, tactile cues required, and needs further education   HOME EXERCISE PROGRAM: Access Code: [email or web address] URL: [email or web address] Date: 09/17/2022 Prepared by: Sedalia Muta  Exercises - Supine Chest Stretch on Foam Roll  - 1 x daily - 2 sets - 3 reps - 20 hold - Supine Shoulder Flexion with Dowel  - 1 x daily - 1 sets - 10 reps - 5 hold - Standing Shoulder External Rotation with Resistance  - 1 x daily - 2 sets - 10 reps - Cat Cow  - 1 x daily - 1 sets - 10-15 reps - 5 hold  ASSESSMENT:  CLINICAL IMPRESSION: Patient  presents with primary complaint of increased pain in bilateral  shoulders and bil knees. Pt with tenderness at medial knees and increased pain with increased activity. She is very active with exercise classes, but will benefit from education on focused quad and hip strengthening for knee pain. She also has bilateral shoulder pain. She has mild limitation for ROM for elevation, and mild strength deficits. She has much stiffness in t-spine,  increased muscle tension in pecs and cervical musculature, and weakness in postural and scapular muscles. Pt with decreased ability for full functional activities and is limited with regular exercise routine due to pain. Pt to benefit from skilled PT to improve deficits and pain.   OBJECTIVE IMPAIRMENTS: decreased ROM, decreased strength, hypomobility, increased muscle spasms, impaired flexibility, impaired UE functional use, improper body mechanics, and pain.   ACTIVITY LIMITATIONS: carrying, lifting, bending, squatting, sleeping, stairs, transfers, reach over head, hygiene/grooming, and locomotion level  PARTICIPATION LIMITATIONS: meal prep, cleaning, community activity, and yard work  PERSONAL FACTORS: Time since onset of injury/illness/exacerbation are also affecting patient's functional outcome.   REHAB POTENTIAL: Good  CLINICAL DECISION MAKING: Stable/uncomplicated  EVALUATION COMPLEXITY: Low   GOALS: Goals reviewed with patient? Yes  SHORT TERM GOALS: Target date: 10/01/2022  Pt to be independent with initial HEP for shoulders and knees   Goal status: INITIAL    LONG TERM GOALS: Target date: 11/12/2022    Pt to be independent with final HEP  Goal status: INITIAL  2.  Pt to report decreased pain in bil shoulders to 0-2/10 with overhead reach, lift, and exercise.    Goal status: INITIAL  3.  Pt to report decreased pain in bil knees, with walking, stairs, and exercise routine.   Goal status: INITIAL  4.  Pt to demo ability and optimal mechanics for squat, to improve pain with IADLs and exercise.    Goal status: INITIAL  5.  Pt to demo improved strength of shoulders to at least 4+/5, to improve ability for IADLs.   Goal status: INITIAL  6.  Pt to demo improved strength and stability of knees and hips to be [facility] for pt age and activity level.   Goal status: INITIAL  PLAN:  PT FREQUENCY: 1-2x/week  PT DURATION: 8 weeks  PLANNED INTERVENTIONS: Therapeutic exercises, Therapeutic activity, Neuromuscular re-education, Patient/Family education, Self Care, Joint mobilization, Joint manipulation, Stair training, Orthotic/Fit training, DME instructions, Aquatic Therapy, Dry Needling, Electrical stimulation, Cryotherapy, Moist heat, Taping, Ultrasound, Ionotophoresis 4mg /ml Dexamethasone, Manual therapy,  Vasopneumatic device, Traction, Spinal manipulation, Spinal mobilization,Balance training, Gait training,   PLAN FOR NEXT SESSION:   quadruped SA press, shoulder flexion ROM,  RTC strength, shoulder stability, pec stretch, mid/low trap strength,    quad and hip strength.   Next visit: HEP for knees.   Sedalia Muta, PT, DPT 9:12 PM  09/17/22

## 2022-09-23 ENCOUNTER — Ambulatory Visit: Payer: Medicare HMO | Admitting: Physical Therapy

## 2022-09-23 ENCOUNTER — Encounter: Payer: Self-pay | Admitting: Physical Therapy

## 2022-09-23 DIAGNOSIS — G8929 Other chronic pain: Secondary | ICD-10-CM

## 2022-09-23 DIAGNOSIS — M25512 Pain in left shoulder: Secondary | ICD-10-CM

## 2022-09-23 DIAGNOSIS — M25561 Pain in right knee: Secondary | ICD-10-CM | POA: Diagnosis not present

## 2022-09-23 DIAGNOSIS — M25511 Pain in right shoulder: Secondary | ICD-10-CM

## 2022-09-23 DIAGNOSIS — M25562 Pain in left knee: Secondary | ICD-10-CM

## 2022-09-23 NOTE — Therapy (Signed)
OUTPATIENT PHYSICAL THERAPY TREATMENT   Patient Name: GOLDY SKIBINSKI MRN: [id number] DOB:[birth year], 72 y.o., female Today's Date: 09/23/2022  END OF SESSION:  PT End of Session - 09/23/22 1234     Visit Number 2    Number of Visits 16    Date for PT Re-Evaluation 11/12/22    Authorization Type [employer]    PT Start Time 1218    PT Stop Time 1256    PT Time Calculation (min) 38 min    Activity Tolerance Patient tolerated treatment well    Behavior During Therapy [facility] for tasks assessed/performed              Past Medical History:  Diagnosis Date   Cellulitis of leg, right 08/27/2016   Hyperlipidemia    Osteopenia    Pneumonia    Vitamin D deficiency    Past Surgical History:  Procedure Laterality Date   COLONOSCOPY  03/2011   Dr Brodie;diminuitive polyp   G 2 P 2     TONSILLECTOMY     Patient Active Problem List   Diagnosis Date Noted   Macular degeneration 08/25/2022   Pain with swallowing 07/30/2022   Deficiency anemia 04/03/2021   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 04/03/2021   Subacute cough 04/02/2021   Abnormal finding on CT scan 09/04/2020   Aneurysm artery, 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   PMR (polymyalgia rheumatica) (HCC) 02/23/2020   Loss of transverse plantar arch 10/19/2018   Neuroma of foot 10/19/2018   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   SI (sacroiliac) joint dysfunction 12/16/2017   Upper airway cough syndrome 11/17/2017   History of colonic polyps 05/22/2014   Prediabetes 09/04/2013   Allergic rhinitis 08/06/2013   Generalized anxiety disorder 11/29/2009   Vitamin D deficiency 04/10/2008   Osteoporosis 10/04/2007    PCP: Cheryll Cockayne  REFERRING PROVIDER: [name]   REFERRING DIAG: [name]   THERAPY DIAG:  Bilateral shoulder pain, unspecified chronicity  Chronic pain of both knees  Rationale for Evaluation and Treatment: Rehabilitation  ONSET DATE:   SUBJECTIVE:                                                                                                                                                                                       SUBJECTIVE STATEMENT:  My shoulders are still aching, combing my hair is uncomfortable. Knees are achey too. Knees and shoulders are both achey, in the past it was frozen shoulder and now shoulders are stiff and hurting.     Hand dominance: Right  PERTINENT HISTORY: none    PAIN:  Are you having pain? Yes: NPRS scale: 8/10 Pain location: shoulders / bil  Pain description:  intermittent, sore Aggravating factors: reaching overhead, combing hair, exercises  Relieving factors: nothing   Are you having pain? Yes: NPRS scale: up to 8/10 Pain location: knees/ bil Pain description: sore, more constant  Aggravating factors: exercise   Relieving factors: rest/doing nothing    PRECAUTIONS: None   WEIGHT BEARING RESTRICTIONS: No  FALLS:  Has patient fallen in last 6 months? No   PLOF: Independent  PATIENT GOALS:   NEXT MD VISIT:   OBJECTIVE:   DIAGNOSTIC FINDINGS:    PATIENT SURVEYS:    COGNITION: Overall cognitive status: Within functional limits for tasks assessed     SENSATION: WFL  POSTURE:   UPPER EXTREMITY ROM:   Shoulder flexion: mild limitation for end range flexion with PROM and AROM.  Mild limitation for end range PROM and AROM for abduction IR/ER: WFL  Knees: WFL Hips: WFL  UPPER EXTREMITY MMT:  MMT Right eval Left eval  Shoulder flexion 4 4  Shoulder extension    Shoulder abduction 4 4  Shoulder adduction    Shoulder internal rotation 4 4  Shoulder external rotation 4 4  Middle trapezius    Lower trapezius    Elbow flexion    Elbow extension    Wrist flexion    Wrist extension    Wrist ulnar deviation    Wrist radial deviation    Wrist pronation    Wrist supination    Grip strength (lbs)    (Blank rows = not tested)  SHOULDER SPECIAL TESTS:   JOINT MOBILITY TESTING:  Very mild hypomobility for shoulder flex and abd with  mild soreness at end ranges.  Hypomobile T-spine  PALPATION:  Mild pain at medial knee, joint line, bilaterally  Minimal pain to palpate shoulders. Tightness in bil pecs, and UT s . Muscle trigger points in bil levator L>R.  Noted lipoma? In L rhomboid region (pt will ask dr about it at next appt).    TODAY'S TREATMENT:                                                                                                                                         DATE:   09/23/22:  Therapeutic Exercise: Aerobic: Supine: Shoulder flexion stretches 5x10 seconds holds B, shoulder flexion 2x5 3#, sidelying shoulder ABD 2# x10 B, bridges + ABD into red TB x10, sidelying hip ABD x10 red TB,   Seated:  LAQs with red TB x12 B Standing:  Mini squats on wall x12 self selected depth, forward step ups 6 inch step x12 B  Stretches:   corner stretch 3x30 seconds B  Neuromuscular Re-education: Manual Therapy: Therapeutic Activity: Self Care:   PATIENT EDUCATION:  Education details: PT POC, Exam findings, HEP Person educated: Patient Education method: Explanation, Demonstration, Tactile cues, Verbal cues, and Handouts Education comprehension: verbalized understanding, returned demonstration, verbal cues required, tactile cues required, and needs further education   HOME  EXERCISE PROGRAM:   Access Code: [email or web address] URL: [email or web address] Date: 09/23/2022 Prepared by: Nedra Hai  Exercises - Supine Chest Stretch on Foam Roll  - 1 x daily - 2 sets - 3 reps - 20 hold - Supine Shoulder Flexion with Dowel  - 1 x daily - 1 sets - 10 reps - 5 hold - Standing Shoulder External Rotation with Resistance  - 1 x daily - 2 sets - 10 reps - Cat Cow  - 1 x daily - 1 sets - 10-15 reps - 5 hold - Supine Bridge with Resistance Band  - 1 x daily - 7 x weekly - 1 sets - 10 reps - 2 hold - Sidelying Hip Abduction with Resistance at Thighs  - 1 x daily - 7 x weekly - 1 sets - 10 reps - 1  hold ASSESSMENT:  CLINICAL IMPRESSION:  Ms. Varner arrives today doing OK, we split out time between shoulder and knee pain per her request. Progressed shoulder work, added some light resistance as ROM is really functional with good tolerance noted by patient. Also worked on knee pain via hip strengthening this session and CKC exercises as tolerated. Will continue to progress as able.   OBJECTIVE IMPAIRMENTS: decreased ROM, decreased strength, hypomobility, increased muscle spasms, impaired flexibility, impaired UE functional use, improper body mechanics, and pain.   ACTIVITY LIMITATIONS: carrying, lifting, bending, squatting, sleeping, stairs, transfers, reach over head, hygiene/grooming, and locomotion level  PARTICIPATION LIMITATIONS: meal prep, cleaning, community activity, and yard work  PERSONAL FACTORS: Time since onset of injury/illness/exacerbation are also affecting patient's functional outcome.   REHAB POTENTIAL: Good  CLINICAL DECISION MAKING: Stable/uncomplicated  EVALUATION COMPLEXITY: Low   GOALS: Goals reviewed with patient? Yes  SHORT TERM GOALS: Target date: 10/01/2022  Pt to be independent with initial HEP for shoulders and knees   Goal status: INITIAL    LONG TERM GOALS: Target date: 11/12/2022    Pt to be independent with final HEP  Goal status: INITIAL  2.  Pt to report decreased pain in bil shoulders to 0-2/10 with overhead reach, lift, and exercise.    Goal status: INITIAL  3.  Pt to report decreased pain in bil knees, with walking, stairs, and exercise routine.   Goal status: INITIAL  4.  Pt to demo ability and optimal mechanics for squat, to improve pain with IADLs and exercise.   Goal status: INITIAL  5.  Pt to demo improved strength of shoulders to at least 4+/5, to improve ability for IADLs.   Goal status: INITIAL  6.  Pt to demo improved strength and stability of knees and hips to be [facility] for pt age and activity level.   Goal  status: INITIAL  PLAN:  PT FREQUENCY: 1-2x/week  PT DURATION: 8 weeks  PLANNED INTERVENTIONS: Therapeutic exercises, Therapeutic activity, Neuromuscular re-education, Patient/Family education, Self Care, Joint mobilization, Joint manipulation, Stair training, Orthotic/Fit training, DME instructions, Aquatic Therapy, Dry Needling, Electrical stimulation, Cryotherapy, Moist heat, Taping, Ultrasound, Ionotophoresis 4mg /ml Dexamethasone, Manual therapy,  Vasopneumatic device, Traction, Spinal manipulation, Spinal mobilization,Balance training, Gait training,   PLAN FOR NEXT SESSION:   quadruped SA press, shoulder flexion ROM,  RTC strength, shoulder stability, pec stretch, mid/low trap strength,    quad and hip strength.      Nedra Hai, PT, DPT 09/23/22 12:56 PM

## 2022-09-26 ENCOUNTER — Encounter: Payer: Self-pay | Admitting: Physical Therapy

## 2022-09-26 ENCOUNTER — Ambulatory Visit (INDEPENDENT_AMBULATORY_CARE_PROVIDER_SITE_OTHER): Payer: Medicare HMO | Admitting: Physical Therapy

## 2022-09-26 DIAGNOSIS — M25512 Pain in left shoulder: Secondary | ICD-10-CM

## 2022-09-26 DIAGNOSIS — M25561 Pain in right knee: Secondary | ICD-10-CM | POA: Diagnosis not present

## 2022-09-26 DIAGNOSIS — G8929 Other chronic pain: Secondary | ICD-10-CM | POA: Diagnosis not present

## 2022-09-26 DIAGNOSIS — M25562 Pain in left knee: Secondary | ICD-10-CM

## 2022-09-26 DIAGNOSIS — M25511 Pain in right shoulder: Secondary | ICD-10-CM | POA: Diagnosis not present

## 2022-09-26 NOTE — Therapy (Addendum)
OUTPATIENT PHYSICAL THERAPY TREATMENT   Patient Name: Linda [name] MRN: [id number] DOB:08-29-1950, 72 y.o., female Today's Date: 09/26/2022  END OF SESSION:  PT End of Session - 09/26/22 1320     Visit Number 3    Number of Visits 16    Date for PT Re-Evaluation 11/12/22    Authorization Type [employer]    PT Start Time 1302    PT Stop Time 1342    PT Time Calculation (min) 40 min    Activity Tolerance Patient tolerated treatment well    Behavior During Therapy [facility] for tasks assessed/performed               Past Medical History:  Diagnosis Date   Cellulitis of leg, right 08/27/2016   Hyperlipidemia    Osteopenia    Pneumonia    Vitamin D deficiency    Past Surgical History:  Procedure Laterality Date   COLONOSCOPY  03/2011   Dr Brodie;diminuitive polyp   G 2 P 2     TONSILLECTOMY     Patient Active Problem List   Diagnosis Date Noted   Macular degeneration 08/25/2022   Pain with swallowing 07/30/2022   Deficiency anemia 04/03/2021   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 04/03/2021   Subacute cough 04/02/2021   Abnormal finding on CT scan 09/04/2020   Aneurysm artery, 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   PMR (polymyalgia rheumatica) (HCC) 02/23/2020   Loss of transverse plantar arch 10/19/2018   Neuroma of foot 10/19/2018   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   SI (sacroiliac) joint dysfunction 12/16/2017   Upper airway cough syndrome 11/17/2017   History of colonic polyps 05/22/2014   Prediabetes 09/04/2013   Allergic rhinitis 08/06/2013   Generalized anxiety disorder 11/29/2009   Vitamin D deficiency 04/10/2008   Osteoporosis 10/04/2007    PCP: Cheryll Cockayne  REFERRING PROVIDER: [name]   REFERRING DIAG: [name]   THERAPY DIAG:  Bilateral shoulder pain, unspecified chronicity  Chronic pain of both knees  Rationale for Evaluation and Treatment: Rehabilitation  ONSET DATE:   SUBJECTIVE:                                                                                                                                                                                       SUBJECTIVE STATEMENT:  I've been doing the exercises that you and the other PT gave me, its feeling better but I guess it has to be a life long thing. I'd rather just focus on the shoulders today.     Hand dominance: Right  PERTINENT HISTORY: none    PAIN:  Are you having pain? Yes: NPRS scale: 7/10 Pain location: shoulders / bil  Pain description: intermittent, sore Aggravating factors: reaching overhead, combing hair, exercises  Relieving factors: nothing   Are you having pain? Yes: NPRS scale: "I just live with knee pain" /10 Pain location: knees/ bil Pain description: sore, more constant  Aggravating factors: exercise   Relieving factors: rest/doing nothing    PRECAUTIONS: None   WEIGHT BEARING RESTRICTIONS: No  FALLS:  Has patient fallen in last 6 months? No   PLOF: Independent  PATIENT GOALS:   NEXT MD VISIT:   OBJECTIVE:   DIAGNOSTIC FINDINGS:    PATIENT SURVEYS:    COGNITION: Overall cognitive status: Within functional limits for tasks assessed     SENSATION: WFL  POSTURE:   UPPER EXTREMITY ROM:   Shoulder flexion: mild limitation for end range flexion with PROM and AROM.  Mild limitation for end range PROM and AROM for abduction IR/ER: WFL  Knees: WFL Hips: WFL  UPPER EXTREMITY MMT:  MMT Right eval Left eval  Shoulder flexion 4 4  Shoulder extension    Shoulder abduction 4 4  Shoulder adduction    Shoulder internal rotation 4 4  Shoulder external rotation 4 4  Middle trapezius    Lower trapezius    Elbow flexion    Elbow extension    Wrist flexion    Wrist extension    Wrist ulnar deviation    Wrist radial deviation    Wrist pronation    Wrist supination    Grip strength (lbs)    (Blank rows = not tested)  SHOULDER SPECIAL TESTS:   JOINT MOBILITY TESTING:  Very mild hypomobility for shoulder flex and abd with  mild soreness at end ranges.  Hypomobile T-spine  PALPATION:  Mild pain at medial knee, joint line, bilaterally  Minimal pain to palpate shoulders. Tightness in bil pecs, and UT s . Muscle trigger points in bil levator L>R.  Noted lipoma? In L rhomboid region (pt will ask dr about it at next appt).    TODAY'S TREATMENT:                                                                                                                                         DATE:   09/26/22:  Therapeutic Exercise: Aerobic: UBE L3 x6 minutes (3 min forward/3 min backwards) Supine: Shoulder flexion 3# x10 B, serratus punches 3# x10 B, shoulder ABD 2# x10 B sidelying, sidelying shoulder ER 2# x10 B Seated:  shoulder flexion + scap retraction with yellow TB x10 Standing:  3 way reaches yellow TB x10 B Stretches:   corner stretch 3x30 seconds B  Neuromuscular Re-education: Manual Therapy: Therapeutic Activity: Self Care:   PATIENT EDUCATION:  Education details: PT POC, Exam findings, HEP Person educated: Patient Education method: Explanation, Demonstration, Tactile cues, Verbal cues, and Handouts Education comprehension: verbalized understanding, returned demonstration, verbal cues required, tactile cues required, and needs further education   HOME EXERCISE PROGRAM:  Access Code: [email or web address] URL: [email or web address] Date: 09/23/2022 Prepared by: Nedra Hai  Exercises - Supine Chest Stretch on Foam Roll  - 1 x daily - 2 sets - 3 reps - 20 hold - Supine Shoulder Flexion with Dowel  - 1 x daily - 1 sets - 10 reps - 5 hold - Standing Shoulder External Rotation with Resistance  - 1 x daily - 2 sets - 10 reps - Cat Cow  - 1 x daily - 1 sets - 10-15 reps - 5 hold - Supine Bridge with Resistance Band  - 1 x daily - 7 x weekly - 1 sets - 10 reps - 2 hold - Sidelying Hip Abduction with Resistance at Thighs  - 1 x daily - 7 x weekly - 1 sets - 10 reps - 1 hold ASSESSMENT:  CLINICAL  IMPRESSION:  Ms. Ontko arrives today doing better, sounds like shoulder pain is starting to improve. Focused on her shoulder pain today per her request, did well and tolerated all exercise progressions. Cues for working through full available ROM of shoulder with resistance training as pain allowed today. Will continue to progress as able and tolerated.   OBJECTIVE IMPAIRMENTS: decreased ROM, decreased strength, hypomobility, increased muscle spasms, impaired flexibility, impaired UE functional use, improper body mechanics, and pain.   ACTIVITY LIMITATIONS: carrying, lifting, bending, squatting, sleeping, stairs, transfers, reach over head, hygiene/grooming, and locomotion level  PARTICIPATION LIMITATIONS: meal prep, cleaning, community activity, and yard work  PERSONAL FACTORS: Time since onset of injury/illness/exacerbation are also affecting patient's functional outcome.   REHAB POTENTIAL: Good  CLINICAL DECISION MAKING: Stable/uncomplicated  EVALUATION COMPLEXITY: Low   GOALS: Goals reviewed with patient? Yes  SHORT TERM GOALS: Target date: 10/01/2022  Pt to be independent with initial HEP for shoulders and knees   Goal status: INITIAL    LONG TERM GOALS: Target date: 11/12/2022    Pt to be independent with final HEP  Goal status: INITIAL  2.  Pt to report decreased pain in bil shoulders to 0-2/10 with overhead reach, lift, and exercise.    Goal status: INITIAL  3.  Pt to report decreased pain in bil knees, with walking, stairs, and exercise routine.   Goal status: INITIAL  4.  Pt to demo ability and optimal mechanics for squat, to improve pain with IADLs and exercise.   Goal status: INITIAL  5.  Pt to demo improved strength of shoulders to at least 4+/5, to improve ability for IADLs.   Goal status: INITIAL  6.  Pt to demo improved strength and stability of knees and hips to be [facility] for pt age and activity level.   Goal status: INITIAL  PLAN:  PT  FREQUENCY: 1-2x/week  PT DURATION: 8 weeks  PLANNED INTERVENTIONS: Therapeutic exercises, Therapeutic activity, Neuromuscular re-education, Patient/Family education, Self Care, Joint mobilization, Joint manipulation, Stair training, Orthotic/Fit training, DME instructions, Aquatic Therapy, Dry Needling, Electrical stimulation, Cryotherapy, Moist heat, Taping, Ultrasound, Ionotophoresis 4mg /ml Dexamethasone, Manual therapy,  Vasopneumatic device, Traction, Spinal manipulation, Spinal mobilization,Balance training, Gait training,   PLAN FOR NEXT SESSION:   quadruped SA press, shoulder flexion ROM,  RTC strength, shoulder stability, pec stretch, mid/low trap strength,    quad and hip strength.      Nedra Hai, PT, DPT 09/26/22 1:43 PM   PHYSICAL THERAPY DISCHARGE SUMMARY  Visits from Start of Care: 3     Plan: Patient agrees to discharge.  Patient goals were partially  met. Patient is being discharged due to- not returning since  last visit.     Sedalia Muta, PT, DPT 10:04 AM  12/23/22

## 2022-09-29 ENCOUNTER — Encounter: Payer: Medicare HMO | Admitting: Physical Therapy

## 2022-10-01 ENCOUNTER — Encounter: Payer: Medicare HMO | Admitting: Physical Therapy

## 2023-03-11 DIAGNOSIS — H10013 Acute follicular conjunctivitis, bilateral: Secondary | ICD-10-CM | POA: Diagnosis not present

## 2023-03-11 DIAGNOSIS — H10012 Acute follicular conjunctivitis, left eye: Secondary | ICD-10-CM | POA: Diagnosis not present

## 2023-04-27 ENCOUNTER — Encounter: Payer: Self-pay | Admitting: Internal Medicine

## 2023-04-28 ENCOUNTER — Ambulatory Visit (INDEPENDENT_AMBULATORY_CARE_PROVIDER_SITE_OTHER): Payer: Medicare HMO | Admitting: Internal Medicine

## 2023-04-28 ENCOUNTER — Encounter: Payer: Self-pay | Admitting: Internal Medicine

## 2023-04-28 VITALS — BP 108/78 | HR 69 | Temp 98.0°F | Ht 64.0 in | Wt 130.0 lb

## 2023-04-28 DIAGNOSIS — R42 Dizziness and giddiness: Secondary | ICD-10-CM

## 2023-04-28 MED ORDER — AMOXICILLIN-POT CLAVULANATE 875-125 MG PO TABS: 1.0000 | ORAL_TABLET | Freq: Two times a day (BID) | ORAL | 0 refills | Status: AC

## 2023-04-28 NOTE — Assessment & Plan Note (Signed)
 Acute Started 1-2 weeks ago Describes it as a lightheadedness/dizziness associated with headaches, sinus pressure She had something similar a few years ago and it was thought to be secondary to a sinus infection PT did help Symptoms suggestive for BPPV Augmentin  875-125 mg twice daily x 7 days Saline nasal spray, symptomatic treatment Referral for vestibular physical therapy

## 2023-04-28 NOTE — Progress Notes (Signed)
 Subjective:    Patient ID: Linda [name], female    DOB: 09/28/1950, 73 y.o.   MRN: [id number]      HPI Linda [name] is here for  Chief Complaint  Patient presents with   Dizziness   Had covid around christmas time  Headaches /lightheadedness - she feels it for the past 1-2 weeks or so.  She feels it when she wakes. She feels it at night as well.   During the day she does not feel it as much.  She has not has more headaches and they have been pressure all over the head. She does feel lightheaded/dizzy  when turning head.  She does feel some sinus pressure.  She has not had any fevers.  She has not been blowing her nose a lot.   Drinks a lot of water.   She did have something similar few years ago.  Medications and allergies reviewed with patient and updated if appropriate.  Current Outpatient Medications on File Prior to Visit  Medication Sig Dispense Refill   Ascorbic Acid (VITAMIN C) 1000 MG tablet Take 1,000 mg by mouth daily.     Calcium Carb-Cholecalciferol (CALCIUM 1000 + D PO) Take by mouth daily.     cholecalciferol (VITAMIN D ) 1000 units tablet Take 1.5 tablets (1,500 Units total) by mouth daily. 90 tablet 2   famotidine  (PEPCID ) 40 MG tablet Take 40 mg by mouth 2 (two) times daily.     fluticasone  (FLONASE ) 50 MCG/ACT nasal spray Place into the nose.     No current facility-administered medications on file prior to visit.    Review of Systems  Constitutional:  Negative for fever.  HENT:  Positive for sinus pressure (nasal area). Negative for congestion and sore throat.   Eyes:  Negative for visual disturbance.  Respiratory:  Negative for cough, shortness of breath and wheezing.   Gastrointestinal:  Negative for nausea.  Neurological:  Positive for dizziness, light-headedness and headaches. Negative for weakness and numbness.       Objective:   Vitals:   04/28/23 1503  BP: 108/78  Pulse: 69  Temp: 98 F (36.7 C)  SpO2: 96%   BP Readings from Last 3  Encounters:  04/28/23 108/78  09/10/22 112/82  08/25/22 122/76   Wt Readings from Last 3 Encounters:  04/28/23 128 lb (58.1 kg)  09/10/22 128 lb 9.6 oz (58.3 kg)  08/25/22 128 lb (58.1 kg)   Body mass index is 21.97 kg/m.    Physical Exam Constitutional:      General: She is not in acute distress.    Appearance: Normal appearance. She is not ill-appearing.  HENT:     Head: Normocephalic and atraumatic.     Right Ear: Tympanic membrane, ear canal and external ear normal.     Left Ear: Tympanic membrane, ear canal and external ear normal.     Mouth/Throat:     Mouth: Mucous membranes are moist.     Pharynx: No oropharyngeal exudate or posterior oropharyngeal erythema.  Eyes:     Conjunctiva/sclera: Conjunctivae normal.  Cardiovascular:     Rate and Rhythm: Normal rate and regular rhythm.  Pulmonary:     Effort: Pulmonary effort is normal. No respiratory distress.     Breath sounds: Normal breath sounds. No wheezing or rales.  Musculoskeletal:     Cervical back: Neck supple. No tenderness.  Lymphadenopathy:     Cervical: No cervical adenopathy.  Skin:    General: Skin is warm and dry.  Neurological:     General: No focal deficit present.     Mental Status: She is alert. Mental status is at baseline.            Assessment & Plan:    See Problem List for Assessment and Plan of chronic medical problems.

## 2023-04-28 NOTE — Patient Instructions (Addendum)
           Medications changes include :   Augmentin x 7 days   A referral was ordered PT at [facility] and someone will call you to schedule an appointment.     Return if symptoms worsen or fail to improve.

## 2023-05-06 ENCOUNTER — Other Ambulatory Visit: Payer: Self-pay

## 2023-05-06 ENCOUNTER — Encounter: Payer: Self-pay | Admitting: Physical Therapy

## 2023-05-06 ENCOUNTER — Ambulatory Visit: Payer: Medicare HMO | Attending: Internal Medicine | Admitting: Physical Therapy

## 2023-05-06 DIAGNOSIS — R42 Dizziness and giddiness: Secondary | ICD-10-CM | POA: Diagnosis not present

## 2023-05-06 NOTE — Therapy (Signed)
OUTPATIENT PHYSICAL THERAPY VESTIBULAR EVALUATION     Patient Name: Linda [name] MRN: [id number] DOB:Apr 08, 1950, 73 y.o., female Today's Date: 05/07/2023  END OF SESSION:  PT End of Session - 05/07/23 1722     Visit Number 1    Number of Visits 9    Date for PT Re-Evaluation 05/29/23    Authorization Type [employer] Medicare-submitted upon completion of eval    PT Start Time 1108    PT Stop Time 1153    PT Time Calculation (min) 45 min    Activity Tolerance Patient tolerated treatment well    Behavior During Therapy WFL for tasks assessed/performed             Past Medical History:  Diagnosis Date   Cellulitis of leg, right 08/27/2016   Hyperlipidemia    Osteopenia    Pneumonia    Vitamin D deficiency    Past Surgical History:  Procedure Laterality Date   COLONOSCOPY  03/2011   Dr Brodie;diminuitive polyp   G 2 P 2     TONSILLECTOMY     Patient Active Problem List   Diagnosis Date Noted   Macular degeneration 08/25/2022   Pain with swallowing 07/30/2022   Deficiency anemia 04/03/2021   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 04/03/2021   Subacute cough 04/02/2021   Abnormal finding on CT scan 09/04/2020   Aneurysm artery, 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   PMR (polymyalgia rheumatica) (HCC) 02/23/2020   Dizziness 10/26/2018   Loss of transverse plantar arch 10/19/2018   Neuroma of foot 10/19/2018   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   SI (sacroiliac) joint dysfunction 12/16/2017   Upper airway cough syndrome 11/17/2017   History of colonic polyps 05/22/2014   Prediabetes 09/04/2013   Allergic rhinitis 08/06/2013   Generalized anxiety disorder 11/29/2009   Vitamin D deficiency 04/10/2008   Osteoporosis 10/04/2007    PCP: Pincus Sanes, MD REFERRING PROVIDER: Pincus Sanes, MD  REFERRING DIAG: R42 (ICD-10-CM) - Dizziness   THERAPY DIAG:  Dizziness and giddiness  ONSET DATE: 04/28/2023 (MD referral)  Rationale for Evaluation and Treatment: Rehabilitation  SUBJECTIVE:    SUBJECTIVE STATEMENT: Been dizzy for a few weeks.  Had Covid over Christmas, and thought I had a sinus infection.  It is only with getting out of bed and sometimes bending over. Pt accompanied by: self  PERTINENT HISTORY: Hx of BPPV 2020 (improved with PT)  PAIN:  Are you having pain? No  PRECAUTIONS: None  RED FLAGS: None   WEIGHT BEARING RESTRICTIONS: No  FALLS: Has patient fallen in last 6 months? No  LIVING ENVIRONMENT: Lives with: lives with their spouse Lives in: House/apartment Stairs: Yes: Internal: 12 steps; can reach both Has following equipment at home: None  PLOF: Independent and Leisure: exercises, dances, does yoga, tai chi, Body Balance  PATIENT GOALS: To help get rid of dizziness  OBJECTIVE:  Note: Objective measures were completed at Evaluation unless otherwise noted.  DIAGNOSTIC FINDINGS: NA for this episode  COGNITION: Overall cognitive status: Within functional limits for tasks assessed   POSTURE:  No Significant postural limitations  Cervical ROM:    Active A/PROM (deg) eval  Flexion 30  Extension 30  Right lateral flexion   Left lateral flexion   Right rotation 42  Left rotation 35 with pain UT  (Blank rows = not tested)  BED MOBILITY:  independent  TRANSFERS: Assistive device utilized: None  Sit to stand: Modified independence Stand to sit: Modified independence  GAIT: Gait pattern: WFL Distance walked: 60 ft  Assistive device utilized: None Level of assistance: Complete Independence   PATIENT SURVEYS:  DHI 16 (yes to difficulty getting out of bed and bending over increasing problem)  VESTIBULAR ASSESSMENT:  GENERAL OBSERVATION: No acute distress   SYMPTOM BEHAVIOR:  Subjective history: 2-3 week hx of dizziness, describes as lightheadedness; did have Covid in late December.  Have had positional vertigo 2020 and PT helped.  Non-Vestibular symptoms: neck pain and headaches  Type of dizziness: Imbalance  (Disequilibrium), Unsteady with head/body turns, and Lightheadedness/Faint  Frequency: daily  Duration: seconds  Aggravating factors: Induced by position change: rolling to the right, rolling to the left, supine to sit, and sit to stand, Induced by motion: bending down to the ground, and Worse in the morning  Relieving factors: slow movements  Progression of symptoms: better  OCULOMOTOR EXAM: Wears contacts  Ocular Alignment: normal  Ocular ROM: No Limitations  Spontaneous Nystagmus: absent  Gaze-Induced Nystagmus: absent  Smooth Pursuits: intact  Saccades: intact  Convergence/Divergence: NT cm    VESTIBULAR - OCULAR REFLEX:   Slow VOR: Normal  VOR Cancellation: Normal  Head-Impulse Test: HIT Right: positive HIT Left: negative  Dynamic Visual Acuity:  NT   POSITIONAL TESTING: Right Dix-Hallpike: no nystagmus Left Dix-Hallpike: no nystagmus and felt dizziness/lightheadedness upon return to sit 8/10, resolves in 10-15 sec Right Roll Test: no nystagmus Left Roll Test: no nystagmus Reports dizziness upon return to sit from R roll test, resolves in 10-15 seconds  R sidelying test> Brandt-Daroff 1st rep, 5/10 lightheadedness, 10-15 sec 2nd rep 5/10 symptoms  MOTION SENSITIVITY:  Motion Sensitivity Quotient Intensity: 0 = none, 1 = Lightheaded, 2 = Mild, 3 = Moderate, 4 = Severe, 5 = Vomiting  Intensity  1. Sitting to supine   2. Supine to L side   3. Supine to R side   4. Supine to sitting   5. L Hallpike-Dix   6. Up from L    7. R Hallpike-Dix   8. Up from R    9. Sitting, head tipped to L knee   10. Head up from L knee   11. Sitting, head tipped to R knee   12. Head up from R knee   13. Sitting head turns x5   14.Sitting head nods x5   15. In stance, 180 turn to L    16. In stance, 180 turn to R      M-CTSIB  Condition 1: Firm Surface, EO 30 Sec, Normal Sway  Condition 2: Firm Surface, EC 30 Sec, Normal Sway  Condition 3: Foam Surface, EO 30 Sec, Mild Sway   Condition 4: Foam Surface, EC 23 Sec, Severe Sway                                                                                                                              TREATMENT DATE: 05/06/2023    Habituation:  Brandt-Daroff: comment: see above, performed x 2 with symptoms 5/10 both reps  Other: NA  PATIENT EDUCATION: Education details: Eval results, POC; discussed rationale for considering BPPV (did not see today, but asked pt to scheduled earlier in the day to reassess) versus motion sensitivity/vestibular hypofunction  Person educated: Patient Education method: Explanation Education comprehension: verbalized understanding  HOME EXERCISE PROGRAM:  GOALS: Goals reviewed with patient? Yes  SHORT TERM GOALS: = LTGs  LONG TERM GOALS: Target date: 05/29/2023  Pt will be independent with HEP for improved dizziness and bed mobility. Baseline:  Goal status: INITIAL  2.  Pt will improve DHI score by at least 4 points, to demo improved dizziness with functional activities. Baseline: Score = 16 Goal status: INITIAL  3.  Pt will report 0/10 dizziness with bed mobility for improved dizziness with functional activities. Baseline:  Goal status: INITIAL  4.  Pt will perform Condition 4 on MCTSIB for 30 seconds with moderate or less sway, for improved vestibular system use for balance. Baseline: 23 sec severe sway Goal status: INITIAL   ASSESSMENT:  CLINICAL IMPRESSION: Patient is a 73 y.o. female who was seen today for physical therapy evaluation and treatment for dizziness.  She reports several week history of having dizziness upon getting out of bed in the mornings.  She describes this as lightheadedness and instability.   She does not report overt spinning sensation.  She does report hx of Covid in late December and residual sinus infection.  Oculomotor testing is unremarkable and with VOR testing, she is noted to have positive head impulse test on R.  With positional testing  for BPPV, there is no nystagmus and no symptoms today in test positions for posterior and horizontal canals; however, she does report the sensation of lightheadedness and dizziness upon sitting from both L and R sides.  She is unable to complete Condition 4 on MCTSIB test, indicating decreased vestibular system use for balance.  She will benefit from skilled physical therapy to address the above stated deficits to decrease dizziness and improved bed mobility and safe transfers out of bed.  OBJECTIVE IMPAIRMENTS: decreased balance and dizziness.   ACTIVITY LIMITATIONS: bending and bed mobility  PARTICIPATION LIMITATIONS:  getting up to use restroom at night and early morning; community fitness participation  PERSONAL FACTORS: 3+ comorbidities: see above; she does have hx of vertigo that responded well to PT about 5 years ago  are also affecting patient's functional outcome.   REHAB POTENTIAL: Good  CLINICAL DECISION MAKING: Stable/uncomplicated  EVALUATION COMPLEXITY: Low   PLAN:  PT FREQUENCY: 1-2x/week  PT DURATION: 4 weeks  PLANNED INTERVENTIONS: 97110-Therapeutic exercises, 97530- Therapeutic activity, [id number]- Neuromuscular re-education, 97535- Self Care, [id number]- Manual therapy, [phone]- Canalith repositioning, Patient/Family education, Balance training, and Vestibular training  PLAN FOR NEXT SESSION: Reassess for positional vertigo and treat as appropriate.  Habituation with Brandt-Daroff and corner balance/vestibular exercises.  *If CRM done, need to add BPPV to tx diagnosis and resend cert*   Esaul Dorwart W., PT 05/07/2023, 5:24 PM  [facility] [address] [place], [place] [address] Phone # [phone] Fax # [phone]  Referring diagnosis? dizziness Treatment diagnosis? (if different than referring diagnosis) dizziness and giddiness What was this (referring dx) caused by? []  Surgery []  Fall [x]  Ongoing issue []   Arthritis []  Other: ____________  Laterality: [x]  Rt [x]  Lt [x]  Both  Check all possible CPT codes:  *CHOOSE 10 OR LESS*    See Planned Interventions listed in the Plan section of the Evaluation.

## 2023-05-07 ENCOUNTER — Encounter: Payer: Self-pay | Admitting: Physical Therapy

## 2023-05-08 ENCOUNTER — Ambulatory Visit: Payer: Medicare HMO

## 2023-05-13 ENCOUNTER — Ambulatory Visit: Payer: Medicare HMO | Admitting: Physical Therapy

## 2023-05-15 ENCOUNTER — Encounter: Payer: Self-pay | Admitting: Internal Medicine

## 2023-05-26 ENCOUNTER — Other Ambulatory Visit: Payer: Self-pay

## 2023-05-26 ENCOUNTER — Ambulatory Visit: Admitting: Family Medicine

## 2023-05-26 ENCOUNTER — Ambulatory Visit (INDEPENDENT_AMBULATORY_CARE_PROVIDER_SITE_OTHER)

## 2023-05-26 VITALS — BP 130/80 | HR 71 | Ht 64.0 in | Wt 128.0 lb

## 2023-05-26 DIAGNOSIS — M25562 Pain in left knee: Secondary | ICD-10-CM | POA: Diagnosis not present

## 2023-05-26 DIAGNOSIS — M5416 Radiculopathy, lumbar region: Secondary | ICD-10-CM

## 2023-05-26 DIAGNOSIS — G8929 Other chronic pain: Secondary | ICD-10-CM

## 2023-05-26 DIAGNOSIS — M4316 Spondylolisthesis, lumbar region: Secondary | ICD-10-CM | POA: Diagnosis not present

## 2023-05-26 DIAGNOSIS — M5116 Intervertebral disc disorders with radiculopathy, lumbar region: Secondary | ICD-10-CM | POA: Diagnosis not present

## 2023-05-26 DIAGNOSIS — M48061 Spinal stenosis, lumbar region without neurogenic claudication: Secondary | ICD-10-CM | POA: Diagnosis not present

## 2023-05-26 DIAGNOSIS — M25561 Pain in right knee: Secondary | ICD-10-CM

## 2023-05-26 DIAGNOSIS — I878 Other specified disorders of veins: Secondary | ICD-10-CM | POA: Diagnosis not present

## 2023-05-26 NOTE — Patient Instructions (Signed)
 Thank you for coming in today.   Please get an Xray today before you leave   I've referred you to Physical Therapy.  Let us know if you don't hear from them in one week.   Check back in 6 weeks

## 2023-05-26 NOTE — Progress Notes (Signed)
 Linda Payor, PhD, LAT, ATC acting as a scribe for Linda Graham, MD.  Linda [name] is a 73 y.o. female who presents to [facility] at [facility] today for L knee and leg pain. Pt was previously seen by Dr. Denyse Amass on 09/10/22 for bilat shoulders and knees  Today, pt c/o L leg pain x 1-wk. No falls or injury. Pt locates pain to the lateral aspect of her whole L leg. She is also having bilat knee pain. Pt notes that she is very active and dose a lot of exercising, and is very concerned about a stress fx.   Radiates: yes Aggravates: increase activity, exercise Treatments tried: Tylenol,   Pertinent review of systems: No fevers or chills  Relevant historical information: Osteoporosis and polymyalgia rheumatica.   Exam:  BP 130/80   Pulse 71   Ht 5\' 4"  (1.626 m)   Wt 128 lb (58.1 kg)   SpO2 96%   BMI 21.97 kg/m  General: Well Developed, well nourished, and in no acute distress.   MSK: L-spine normal appearing Decreased lumbar motion. Lower extremity strength is intact. Negative slump test. Reflexes and strength are intact distally.  Left knee normal-appearing Medicine palpation medial joint line. Normal knee motion. Stable ligamentous exam. Intact strength.  Left lower leg normal-appearing nontender normal motion. Strength is intact with ankle and foot motion.  This does not reproduce pain.   Lab and Radiology Results  Diagnostic Limited MSK Ultrasound of: Knee and lower leg laterally Mild degeneration medial joint line.  Mild knee effusion.  Normal-appearing patellar and quad tendon. Normal-appearing lateral knee and lateral lower leg bone cortex. Impression: Mild knee effusion.  Mild knee DJD medially.   X-ray images lumbar spine and left knee obtained today personally and independently interpreted.  Lumbar spine: DDD L5-S1.  No acute fractures are visible.  Left knee: Mild medial DJD.  Mild patellofemoral DJD.  No acute fractures.  Await  formal radiology review  Assessment and Plan: 73 y.o. female with left knee and lower leg pain.  Differential includes knee DJD and lumbar radiculopathy.  Lower extremity stress fracture is extremely unlikely but does remain a possibility.  Plan for physical therapy trial.  Check back in 6 weeks if not improved consider either knee injection or MRI lumbar spine or both.   PDMP not reviewed this encounter. Orders Placed This Encounter  Procedures   [place] LIMITED JOINT SPACE STRUCTURES LOW BILAT(NO LINKED CHARGES)    Reason for Exam (SYMPTOM  OR DIAGNOSIS REQUIRED):   bilateral knee pain    Preferred imaging location?:   [facility] Knee AP/LAT W/[place] Left    Standing Status:   Future    Number of Occurrences:   1    Expiration Date:   06/26/2023    Reason for Exam (SYMPTOM  OR DIAGNOSIS REQUIRED):   left knee pain    Preferred imaging location?:   [facility]   DG Lumbar Spine 2-3 Views    Standing Status:   Future    Number of Occurrences:   1    Expiration Date:   06/26/2023    Reason for Exam (SYMPTOM  OR DIAGNOSIS REQUIRED):   lumbar radiculitis    Preferred imaging location?:   [facility]   Ambulatory referral to Physical Therapy    Referral Priority:   Routine    Referral Type:   Physical Medicine    Referral Reason:   Specialty Services  Required    Requested Specialty:   Physical Therapy    Number of Visits Requested:   1   No orders of the defined types were placed in this encounter.    Discussed warning signs or symptoms. Please see discharge instructions. Patient expresses understanding.   The above documentation has been reviewed and is accurate and complete Linda [name], M.D.

## 2023-06-11 DIAGNOSIS — H5211 Myopia, right eye: Secondary | ICD-10-CM | POA: Diagnosis not present

## 2023-06-11 DIAGNOSIS — H25013 Cortical age-related cataract, bilateral: Secondary | ICD-10-CM | POA: Diagnosis not present

## 2023-06-11 DIAGNOSIS — H524 Presbyopia: Secondary | ICD-10-CM | POA: Diagnosis not present

## 2023-06-15 ENCOUNTER — Encounter: Payer: Self-pay | Admitting: Family Medicine

## 2023-06-15 NOTE — Progress Notes (Signed)
Left knee x-ray shows mild arthritis underneath the kneecap.

## 2023-06-15 NOTE — Progress Notes (Signed)
 Back x-ray shows mild arthritis at multiple levels of the low back.

## 2023-06-29 ENCOUNTER — Ambulatory Visit: Admitting: Physician Assistant

## 2023-06-29 ENCOUNTER — Ambulatory Visit (INDEPENDENT_AMBULATORY_CARE_PROVIDER_SITE_OTHER): Admitting: Orthopedic Surgery

## 2023-06-29 ENCOUNTER — Other Ambulatory Visit (INDEPENDENT_AMBULATORY_CARE_PROVIDER_SITE_OTHER): Payer: Self-pay

## 2023-06-29 DIAGNOSIS — M25531 Pain in right wrist: Secondary | ICD-10-CM | POA: Diagnosis not present

## 2023-07-01 ENCOUNTER — Encounter: Payer: Self-pay | Admitting: Orthopedic Surgery

## 2023-07-01 NOTE — Progress Notes (Signed)
 Office Visit Note   Patient: Linda [name]           Date of Birth: 1950/04/01           MRN: [id number] Visit Date: 06/29/2023 Requested by: Pincus Sanes, MD [address] [place],  [place] [address] PCP: Pincus Sanes, MD  Subjective: Chief Complaint  Patient presents with   Right Wrist - Pain    Fall 06/27/23    HPI: Linda [name] is a 73 y.o. female who presents to the office reporting right wrist pain.  She had a fall 06/27/2023.  She is right-hand dominant.  Does do a lot of exercising.  Reports swelling.  She has tried to get her right ring wedding band off of her ring finger.  She has mild swelling in the hand and wrist but not excessive particularly around the finger.  No prior wrist surgery.  She is retired.  She cares for her mother.  Has not taken really any medication for this problem yet.  In terms of working out she likes to do yoga Pilates weights as well as machines.  Aleve has been slightly helpful.  She is able to sleep..                ROS: All systems reviewed are negative as they relate to the chief complaint within the history of present illness.  Patient denies fevers or chills.  Assessment & Plan: Visit Diagnoses:  1. Pain in right wrist     Plan: Impression is minimally displaced right distal radius fracture.  Plan at this time is elevation of the hand and motion of the fingers.  I can easily rotate the ring on her ring finger around.  I think if she elevates the hand we would not be required to remove the ring.  Short arm cast applied today.  Patient does have a history of osteoporosis.  7-day return with repeat radiographs in the cast.  Follow-Up Instructions: No follow-ups on file.   Orders:  Orders Placed This Encounter  Procedures   XR Wrist Complete Right   No orders of the defined types were placed in this encounter.     Procedures: No procedures performed   Clinical Data: No additional findings.  Objective: Vital Signs: There  were no vitals taken for this visit.  Physical Exam:  Constitutional: Patient appears well-developed HEENT:  Head: Normocephalic Eyes:EOM are normal Neck: Normal range of motion Cardiovascular: Normal rate Pulmonary/chest: Effort normal Neurologic: Patient is alert Skin: Skin is warm Psychiatric: Patient has normal mood and affect  Ortho Exam: Ortho exam demonstrates mild swelling around the distal radius.  Elbow range of motion on the right is intact.  Motor or sensory function of the hand is intact.  No masses lymphadenopathy or skin changes noted in that right arm region outside of swelling around the right wrist.  EPL FPL interosseous strength is intact.  She has at most 10 to 15% more swelling on the right hand and fingers as opposed to the left hand fingers.  Ring is very mobile around that ring finger and I do not think it requires removal at this time.  It has been 2 days since her fracture so I think in terms of swelling and removal as long as she keeps it elevated at the next week we may not have to remove the ring.  Specialty Comments:  No specialty comments available.  Imaging: No results found.   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   Upper airway cough syndrome 11/17/2017   History of colonic polyps 05/22/2014   Prediabetes 09/04/2013   Allergic rhinitis 08/06/2013   Generalized anxiety disorder 11/29/2009   Vitamin D deficiency 04/10/2008   Osteoporosis 10/04/2007    Past Medical History:  Diagnosis Date   Cellulitis of leg, right 08/27/2016   Hyperlipidemia    Osteopenia    Pneumonia    Vitamin D deficiency     Family History  Problem Relation Age of Onset   Melanoma Father    Hyperlipidemia Mother    Heart disease Mother        no MI   Diabetes Paternal Grandmother        vision loss   Diabetes Maternal Uncle    Colon cancer Neg Hx    Stomach cancer Neg Hx    Esophageal cancer Neg Hx    Rectal cancer Neg Hx    Stroke Neg Hx     Past Surgical History:  Procedure Laterality Date   COLONOSCOPY  03/2011   Dr Brodie;diminuitive polyp   G 2 P 2     TONSILLECTOMY     Social History   Occupational History   Occupation: Photographer  Tobacco Use   Smoking status: Never   Smokeless tobacco: Never  Vaping Use   Vaping status: Never Used  Substance and Sexual Activity   Alcohol use: Yes    Comment:  1-2 wine or beer nightly   Drug use: No   Sexual activity: Not on file

## 2023-07-06 ENCOUNTER — Encounter: Payer: Self-pay | Admitting: Orthopedic Surgery

## 2023-07-06 ENCOUNTER — Other Ambulatory Visit (INDEPENDENT_AMBULATORY_CARE_PROVIDER_SITE_OTHER): Payer: Self-pay

## 2023-07-06 ENCOUNTER — Ambulatory Visit (INDEPENDENT_AMBULATORY_CARE_PROVIDER_SITE_OTHER): Admitting: Orthopedic Surgery

## 2023-07-06 DIAGNOSIS — M25531 Pain in right wrist: Secondary | ICD-10-CM

## 2023-07-06 NOTE — Progress Notes (Unsigned)
 Post-Op Visit Note   Patient: Linda [name]           Date of Birth: 04-Jun-1950           MRN: [id number] Visit Date: 07/06/2023 PCP: Pincus Sanes, MD   Assessment & Plan:  Chief Complaint:  Chief Complaint  Patient presents with   Right Wrist - Fracture, Follow-up    DOI: 06/27/23   Visit Diagnoses:  1. Pain in right wrist     Plan: Linda [name] is now about 10 days out right wrist nondisplaced fracture.  She was placed in a cast.  This was a short arm cast.  On exam she is done a very good job of elevating her hand because the swelling in her hand is minimal.  No issues with that ring finger swelling.  Ring moods easily around the finger.  EPL FPL interosseous strength is intact.  Radiographs show no change in fracture alignment.  Plan is 2-week return with radiographs out of the cast at that time and change over to removable wrist splint.  Will likely also start her in some physical therapy/Occupational Therapy at that time depending on how much callus we see.  She does have full pronation and supination.  Moleskin applied to that area around the thumb which was irritating her thumb a little bit.  If that does not work we can potentially change out the cast.  Follow-Up Instructions: Return in about 2 weeks (around 07/20/2023).   Orders:  Orders Placed This Encounter  Procedures   XR Wrist Complete Right   No orders of the defined types were placed in this encounter.   Imaging: XR Wrist Complete Right Result Date: 07/06/2023 AP lateral oblique radiographs right wrist reviewed fracture unchanged in alignment with no further displacement.  No callus formation yet.   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   Upper airway cough syndrome 11/17/2017   History of colonic polyps 05/22/2014   Prediabetes 09/04/2013   Allergic rhinitis 08/06/2013   Generalized anxiety disorder 11/29/2009   Vitamin D deficiency 04/10/2008   Osteoporosis 10/04/2007   Past Medical History:  Diagnosis Date   Cellulitis of leg, right 08/27/2016   Hyperlipidemia    Osteopenia    Pneumonia    Vitamin D deficiency     Family History  Problem Relation Age of Onset   Melanoma Father    Hyperlipidemia Mother    Heart disease Mother        no MI   Diabetes Paternal Grandmother        vision loss   Diabetes Maternal Uncle    Colon cancer Neg Hx    Stomach cancer Neg Hx    Esophageal cancer Neg Hx    Rectal cancer Neg Hx    Stroke Neg Hx     Past Surgical History:  Procedure Laterality Date   COLONOSCOPY  03/2011   Dr Brodie;diminuitive polyp   G 2 P 2     TONSILLECTOMY  Social History   Occupational History   Occupation: Photographer  Tobacco Use   Smoking status: Never   Smokeless tobacco: Never  Vaping Use   Vaping status: Never Used  Substance and Sexual Activity   Alcohol use: Yes    Comment:  1-2 wine or beer nightly   Drug use: No   Sexual activity: Not on file

## 2023-07-20 ENCOUNTER — Ambulatory Visit (INDEPENDENT_AMBULATORY_CARE_PROVIDER_SITE_OTHER): Admitting: Surgical

## 2023-07-20 ENCOUNTER — Other Ambulatory Visit (INDEPENDENT_AMBULATORY_CARE_PROVIDER_SITE_OTHER): Payer: Self-pay

## 2023-07-20 DIAGNOSIS — M25531 Pain in right wrist: Secondary | ICD-10-CM

## 2023-07-23 NOTE — Therapy (Signed)
 OUTPATIENT OCCUPATIONAL THERAPY ORTHO EVALUATION  Patient Name: Linda [name] MRN: [id number] DOB:21-Dec-1950, 73 y.o., female Today's Date: 07/27/2023  PCP: Oma Bias, MD REFERRING PROVIDER: Casilda Clayman, PA-C   END OF SESSION:  OT End of Session - 07/27/23 0845     Visit Number 1    Number of Visits 10    Date for OT Re-Evaluation 09/11/23    Authorization Type [employer]    OT Start Time 0845    OT Stop Time 0923    OT Time Calculation (min) 38 min    Activity Tolerance Patient limited by fatigue;No increased pain;Patient tolerated treatment well;Patient limited by pain    Behavior During Therapy [facility] for tasks assessed/performed             Past Medical History:  Diagnosis Date   Cellulitis of leg, right 08/27/2016   Hyperlipidemia    Osteopenia    Pneumonia    Vitamin D  deficiency    Past Surgical History:  Procedure Laterality Date   COLONOSCOPY  03/2011   Dr Brodie;diminuitive polyp   G 2 P 2     TONSILLECTOMY     Patient Active Problem List   Diagnosis Date Noted   Macular degeneration 08/25/2022   Pain with swallowing 07/30/2022   Deficiency anemia 04/03/2021   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 04/03/2021   Subacute cough 04/02/2021   Abnormal finding on CT scan 09/04/2020   Aneurysm artery, 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   PMR (polymyalgia rheumatica) (HCC) 02/23/2020   Dizziness 10/26/2018   Loss of transverse plantar arch 10/19/2018   Neuroma of foot 10/19/2018   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   SI (sacroiliac) joint dysfunction 12/16/2017   Upper airway cough syndrome 11/17/2017   History of colonic polyps 05/22/2014   Prediabetes 09/04/2013   Allergic rhinitis 08/06/2013   Generalized anxiety disorder 11/29/2009   Vitamin D  deficiency 04/10/2008   Osteoporosis 10/04/2007    ONSET DATE: DOI 06/27/23 (fall)   REFERRING DIAG: M25.531 (ICD-10-CM) - Pain in right wrist   THERAPY DIAG:  Stiffness of right wrist, not elsewhere classified -  Plan: Ot plan of care cert/re-cert  Other lack of coordination - Plan: Ot plan of care cert/re-cert  Muscle weakness (generalized) - Plan: Ot plan of care cert/re-cert  Localized edema - Plan: Ot plan of care cert/re-cert  Pain in right wrist - Plan: Ot plan of care cert/re-cert  Rationale for Evaluation and Treatment: Rehabilitation  SUBJECTIVE:   SUBJECTIVE STATEMENT: She is 4 weeks s/p Rt DRF with conservative healing in cast.   She states having some pain and problems to the broken right wrist, some swelling, etc.  She normally stays very active, doing Zumba, has been trying to avoid these things.  She does state falling yesterday and twisting her ankle, and does have a recent history of dizziness and falls.  She attended 1 physical therapy visit for this and then stopped attending because she "did not like it."  She was encouraged to get back into physical therapy to work on this problem.  She arrives wearing a prefabricated wrist cock up brace today.  She states that it fits her fairly well   PERTINENT HISTORY: "To start in 1 week-s/p right wrist fx-to work on PROM and AROM *NO LIFTING OR STRENGTHENING YET*"  PRECAUTIONS: Fall (OT will use close proximity, has recommended return to physical therapy to work on this risk of falls mixed with dizziness)  RED FLAGS: None   WEIGHT BEARING RESTRICTIONS: Yes nonweightbearing through  right wrist and arm now and for the next 6 weeks  PAIN:  Are you having pain? Yes: NPRS scale: 1-2/10 now, up to 7/10 at worst usually in the night Pain location: Right wrist Pain description: Aching and sore Aggravating factors: Motion and weightbearing Relieving factors: Rest  FALLS: Has patient fallen in last 6 months? Yes. Number of falls 2 at least, does have hx of dizziness  LIVING ENVIRONMENT: Lives with: lives with their spouse Lives in: House/apartment Has following equipment at home: None  PLOF: Independent  PATIENT GOALS: Improve use  of right dominant hand and arm for daily ability  NEXT MD VISIT: 08/05/2023   OBJECTIVE: (All objective assessments below are from initial evaluation on: 07/27/23 unless otherwise specified.)   HAND DOMINANCE: Right   ADLs: Overall ADLs: States decreased ability to grab, hold household objects, pain and difficulty to open containers, perform FMS tasks (manipulate fasteners on clothing), mild to moderate bathing problems as well.    FUNCTIONAL OUTCOME MEASURES: Eval: Patient Rated Wrist Evaluation (PRWE): Pain: 36/50; Function: 39.5/50; Total Score: 75.5/100 (Higher Score  =  More Pain and/or Debility)    UPPER EXTREMITY ROM     Shoulder to Wrist AROM Right eval Left eval  Forearm supination 61   Forearm pronation  76   Wrist flexion 43 80  Wrist extension 48 65  Wrist ulnar deviation 30   Wrist radial deviation 20   (Blank rows = not tested)   Hand AROM Right eval  Full Fist Ability (or Gap to Distal Palmar Crease) LOOSE FULL FIST   Thumb Opposition  (Kapandji Scale)  8/10  (Blank rows = not tested)   UPPER EXTREMITY MMT:    Eval:  NT at eval due to recent and still healing injuries. Will be tested when appropriate.   MMT Right TBD Left TBD  Shoulder flexion    Shoulder abduction    Shoulder adduction    Shoulder extension    Shoulder internal rotation    Shoulder external rotation    Middle trapezius    Lower trapezius    Elbow flexion    Elbow extension    Forearm supination    Forearm pronation    Wrist flexion    Wrist extension    Wrist ulnar deviation    Wrist radial deviation    (Blank rows = not tested)  HAND FUNCTION: Eval: Observed weakness in affected Rt hand. Details TBD when safe  Grip strength Right: TBD lbs, Left: TBD lbs   COORDINATION: Eval: Observed coordination impairments with affected Rt hand/arm.  Details will be tested in the next few sessions as needed 9 Hole Peg Test Right: TBDsec,  (TBD sec is WFL)   SENSATION: Eval:   Light touch intact today   EDEMA:   Eval:  Mildly swollen in right wrist and arm today  COGNITION: Eval: Overall cognitive status: WFL for evaluation today, though she does repeat questions multiple times and states having slight memory issues.  OT will give a written materials to help with this and repeat directions as necessary  OBSERVATIONS:   Eval: Wrist is a bit stiff, swollen, largely nontender.  Hand and thumb seem to be working fairly well today, and she is in very good physical condition otherwise.  Dizziness and falling is a concern and she was encouraged to get back into physical therapy to help with this.   TODAY'S TREATMENT:  Post-evaluation treatment:    For safety/self-care she was given the following information to  avoid weightbearing more than a pen or pencil in the right hand and arm now and for the next 4 to 6 weeks.  She can attend Zumba classes as long as they are nonweightbearing and nonpainful to her, and she wears her prefabricated wrist cock up.  She should wear this at all times except for showers and therapist given exercises.  She was given the following home exercises to perform 4 or 5 times a day as tolerated to light tension points, with no pain, and it was explained to her the purpose is to improve and maintain her mobility, not do anything painful or forceful.  She demonstrates some back, but does need multiple directions as she seems to be forgetful today.  Exercises - Reach arms upward   - 4 x daily - 10 reps - Turn [employer] Facing Up & Down  - 4-6 x daily - 10-15 reps - Bend and Pull Back Wrist SLOWLY  - 4 x daily - 10-15 reps - "Windshield Wipers"   - 4 x daily - 10-15 reps - Tendon Glides  - 4-6 x daily - 3-5 reps - 2-3 seconds hold - Thumb Opposition  - 4-6 x daily - 10 reps  PATIENT EDUCATION: Education details: See tx section above for details  Person educated: Patient Education method: Engineer, structural, Teach back, Handouts  Education  comprehension: States and demonstrates understanding, Additional Education required    HOME EXERCISE PROGRAM: Access Code: [email or web address] URL: [email or web address] Date: 07/27/2023 Prepared by: Leartis Proud    GOALS: Goals reviewed with patient? Yes   SHORT TERM GOALS: (STG required if POC>30 days) Target Date: 08/14/2023  Pt will obtain protective, custom orthotic. Goal status: TBD/PRN  2.  Pt will demo/state understanding of initial HEP to improve pain levels and prerequisite motion. Goal status: INITIAL   LONG TERM GOALS: Target Date: 09/11/23  Pt will improve functional ability by decreased impairment per PRWE assessment from 75.5 to 30 or better, for better quality of life. Goal status: INITIAL  2.  Pt will improve grip strength in right dominant hand from unsafe to test lbs to at least 25 lbs for functional use at home and in IADLs. Goal status: INITIAL  3.  Pt will improve A/ROM in right wrist flexion/extension from 43/48 respectively to at least 60 degrees each, to have functional motion for tasks like reach and grasp.  Goal status: INITIAL  4.  Pt will improve strength in right wrist flexion/extension from apparent 3 -/5 MMT to at least 4+/5 MMT to have increased functional ability to carry out selfcare and higher-level homecare tasks with less difficulty. Goal status: INITIAL  5.  Pt will improve coordination skills in right hand and arm, as seen by within functional limit score on nine-hole peg testing to have increased functional ability to carry out fine motor tasks (fasteners, etc.) and more complex, coordinated IADLs (meal prep, sports, etc.).  Goal status: INITIAL  6.  Pt will decrease pain at worst from 7/10 to 2/10 or better to have better sleep and occupational participation in daily roles. Goal status: INITIAL   ASSESSMENT:  CLINICAL IMPRESSION: Patient is a 73 y.o. female who was seen today for occupational therapy evaluation for  fractured right distal radius, swelling, stiffness, weakness and decreased functional ability and knowledge of safety precautions.  She will benefit from outpatient occupational therapy to decrease symptoms and increase quality of life.   PERFORMANCE DEFICITS: in functional skills including ADLs, IADLs, coordination, dexterity, proprioception, edema, ROM, strength, pain,  fascial restrictions, muscle spasms, flexibility, body mechanics, endurance, decreased knowledge of precautions, and UE functional use, cognitive skills including memory, problem solving, and safety awareness, and psychosocial skills including coping strategies, environmental adaptation, and habits.   IMPAIRMENTS: are limiting patient from ADLs, IADLs, rest and sleep, leisure, and social participation.   COMORBIDITIES: may have co-morbidities  that affects occupational performance. Patient will benefit from skilled OT to address above impairments and improve overall function.  MODIFICATION OR ASSISTANCE TO COMPLETE EVALUATION: No modification of tasks or assist necessary to complete an evaluation.  OT OCCUPATIONAL PROFILE AND HISTORY: Problem focused assessment: Including review of records relating to presenting problem.  CLINICAL DECISION MAKING: Moderate - several treatment options, min-mod task modification necessary  REHAB POTENTIAL: Excellent  EVALUATION COMPLEXITY: Low      PLAN:  OT FREQUENCY: 1-2x/week  OT DURATION: 6 weeks through 09/11/2023 and up to 10 total visits as needed  PLANNED INTERVENTIONS: 97168 OT Re-evaluation, 97535 self care/ADL training, 16109 therapeutic exercise, 97530 therapeutic activity, 97112 neuromuscular re-education, 97140 manual therapy, 97035 ultrasound, 97039 fluidotherapy, 97010 moist heat, 97010 cryotherapy, 97760 Orthotic Initial, 97763 Orthotic/Prosthetic subsequent, passive range of motion, Dry needling, energy conservation, coping strategies training, and patient/family  education  RECOMMENDED OTHER SERVICES: Recommended to return to physical therapy to work on dizziness and possibly vertigo issues, as she keeps falling and remains a fall risk  CONSULTED AND AGREED WITH PLAN OF CARE: Patient  PLAN FOR NEXT SESSION:   Review initial home exercise program, ensure that her prefabricated brace is working well for her, and if not a custom orthosis could be fabricated to aid her healing.  Progress per protocols and as safe and tolerated   Leartis Proud, OTR/L, CHT 07/27/2023, 10:22 AM    Referring diagnosis? M25.531 (ICD-10-CM) - Pain in right wrist  Treatment diagnosis? (if different than referring diagnosis) M25.631, M25.531 What was this (referring dx) caused by? []  Surgery [x]  Fall []  Ongoing issue []  Arthritis []  Other: ____________  Laterality: [x]  Rt []  Lt []  Both  Check all possible CPT codes:  *CHOOSE 10 OR LESS*    See Planned Interventions listed in the Plan section of the Evaluation.   60454 OT Re-evaluation, 97535 self care/ADL training, 09811 therapeutic exercise, 97530 therapeutic activity, 97112 neuromuscular re-education, 97140 manual therapy, 97035 ultrasound, 97039 fluidotherapy, 97010 moist heat, 97010 cryotherapy, 97760 Orthotic Initial, 97763 Orthotic/Prosthetic subsequent, passive range of motion, Dry needling, energy conservation, coping strategies training, and patient/family education

## 2023-07-24 ENCOUNTER — Encounter: Admitting: Rehabilitative and Restorative Service Providers"

## 2023-07-26 ENCOUNTER — Encounter: Payer: Self-pay | Admitting: Surgical

## 2023-07-26 NOTE — Progress Notes (Signed)
 Post-fracture visit Note   Patient: Linda [name]           Date of Birth: [birth year]           MRN: [id number] Visit Date: 07/20/2023 PCP: Colene Dauphin, MD   Assessment & Plan:  Chief Complaint:  Chief Complaint  Patient presents with   Right Wrist - Follow-up, Fracture     Right Wrist - Fracture, Follow-up     DOI: 06/27/23     Visit Diagnoses:  1. Pain in right wrist     Plan: The patient is a 73 year old female who presents for reevaluation of right wrist fracture.  Date of injury 06/27/23.  Has been in a cast up until today.  Cast removed.  Radiographs demonstrate no significant change in alignment at the fracture site.  Fracture line still visible.  Pain is overall controlled and she is not taking any medications for pain.  On exam, patient has intact EPL, FPL, finger abduction, wrist extension, pronation/supination.  She has fairly good early wrist extension and pronation/supination considering she is about 3 weeks out from her injury.  Plan is discontinue cast and try removable right wrist brace.  We will set her up for occupational therapy upstairs with Nate for optimization of her passive and active range of motion but no lifting or strengthening exercises yet.  She will start occupational therapy in approximately 1 week.  Follow-up in 2 weeks for clinical recheck with Dr. Rozelle Corning.  Follow-Up Instructions: No follow-ups on file.   Orders:  Orders Placed This Encounter  Procedures   XR Wrist 2 Views Right   Ambulatory referral to Occupational Therapy   No orders of the defined types were placed in this encounter.   Imaging: No results found.  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   SI (sacroiliac) joint dysfunction 12/16/2017   Upper airway cough syndrome 11/17/2017   History of colonic polyps 05/22/2014   Prediabetes 09/04/2013   Allergic rhinitis 08/06/2013   Generalized anxiety disorder 11/29/2009   Vitamin D  deficiency 04/10/2008   Osteoporosis 10/04/2007   Past Medical History:  Diagnosis Date   Cellulitis of leg, right 08/27/2016   Hyperlipidemia    Osteopenia    Pneumonia    Vitamin D  deficiency     Family History  Problem Relation Age of Onset   Melanoma Father    Hyperlipidemia Mother    Heart disease Mother        no MI   Diabetes Paternal Grandmother        vision loss   Diabetes Maternal Uncle    Colon cancer Neg Hx    Stomach cancer Neg Hx    Esophageal cancer Neg Hx    Rectal cancer Neg Hx    Stroke Neg Hx     Past Surgical History:  Procedure Laterality Date   COLONOSCOPY  03/2011   Dr Brodie;diminuitive polyp   G 2 P 2     TONSILLECTOMY     Social History   Occupational History  Occupation: banking  Tobacco Use   Smoking status: Never   Smokeless tobacco: Never  Vaping Use   Vaping status: Never Used  Substance and Sexual Activity   Alcohol use: Yes    Comment:  1-2 wine or beer nightly   Drug use: No   Sexual activity: Not on file

## 2023-07-27 ENCOUNTER — Other Ambulatory Visit (INDEPENDENT_AMBULATORY_CARE_PROVIDER_SITE_OTHER): Payer: Self-pay

## 2023-07-27 ENCOUNTER — Ambulatory Visit: Admitting: Rehabilitative and Restorative Service Providers"

## 2023-07-27 ENCOUNTER — Ambulatory Visit: Admitting: Orthopedic Surgery

## 2023-07-27 ENCOUNTER — Encounter: Payer: Self-pay | Admitting: Rehabilitative and Restorative Service Providers"

## 2023-07-27 DIAGNOSIS — R600 Localized edema: Secondary | ICD-10-CM

## 2023-07-27 DIAGNOSIS — M25572 Pain in left ankle and joints of left foot: Secondary | ICD-10-CM

## 2023-07-27 DIAGNOSIS — M25531 Pain in right wrist: Secondary | ICD-10-CM | POA: Diagnosis not present

## 2023-07-27 DIAGNOSIS — R278 Other lack of coordination: Secondary | ICD-10-CM

## 2023-07-27 DIAGNOSIS — M25631 Stiffness of right wrist, not elsewhere classified: Secondary | ICD-10-CM

## 2023-07-27 DIAGNOSIS — M6281 Muscle weakness (generalized): Secondary | ICD-10-CM

## 2023-07-29 ENCOUNTER — Encounter: Payer: Self-pay | Admitting: Orthopedic Surgery

## 2023-07-29 NOTE — Progress Notes (Signed)
 Office Visit Note   Patient: Linda [name]           Date of Birth: December 25, 1950           MRN: [id number] Visit Date: 07/27/2023 Requested by: Colene Dauphin, MD [address] [name],  [place] [address] PCP: Colene Dauphin, MD  Subjective: Chief Complaint  Patient presents with   Left Ankle - Pain    DOI: 07/26/23    HPI: Linda [name] is a 73 y.o. female who presents to the office reporting left ankle injury.  Patient is 1 day out from inversion injury to the left ankle when she stepped in a storm drain.  Denies any knee symptoms.  She did not land on her right wrist which is currently healing from a fracture.  She has physical therapy today for the wrist.  She has been able to fully weight-bear on that left-hand side.  No history of prior ankle injury.  Patient is very active in terms of working out..                ROS: All systems reviewed are negative as they relate to the chief complaint within the history of present illness.  Patient denies fevers or chills.  Assessment & Plan: Visit Diagnoses:  1. Pain in left ankle and joints of left foot     Plan: Impression is left ankle grade 1 sprain.  Does have some swelling around the lateral malleolus and is tender around the ATFL but not on the lateral malleolus itself.  No medial sided tenderness.  Base of the fifth metatarsal nontender.  Ace wrap applied.  Continue with rest ice compression elevation.  I think also regarding her right wrist she has good range of motion and significantly decreased swelling.  Would be good for [facility] to discontinue that splint in 1 week and not do any lifting more than 1 to 2 pounds at that time.  Also be good for her to discontinue that splint at night.  2-week return for recheck on the wrist and the left ankle.  Follow-Up Instructions: No follow-ups on file.   Orders:  Orders Placed This Encounter  Procedures   XR Ankle Complete Left   No orders of the defined types were placed in this  encounter.     Procedures: No procedures performed   Clinical Data: No additional findings.  Objective: Vital Signs: There were no vitals taken for this visit.  Physical Exam:  Constitutional: Patient appears well-developed HEENT:  Head: Normocephalic Eyes:EOM are normal Neck: Normal range of motion Cardiovascular: Normal rate Pulmonary/chest: Effort normal Neurologic: Patient is alert Skin: Skin is warm Psychiatric: Patient has normal mood and affect  Ortho Exam: Ortho exam demonstrates mild swelling around the ATFL in that left ankle.  No medial sided tenderness.  Palpable intact nontender anterior to posterior to peroneal and Achilles tendons.  No tenderness at the base of the fifth metatarsal.  Ankle stability is good to anterior drawer testing as well as varus tilt testing on the left-hand side.  Specialty Comments:  No specialty comments available.  Imaging: No results found.   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   SI (sacroiliac) joint dysfunction 12/16/2017   Upper airway cough syndrome 11/17/2017   History of colonic polyps 05/22/2014   Prediabetes 09/04/2013   Allergic rhinitis 08/06/2013   Generalized anxiety disorder 11/29/2009   Vitamin D  deficiency 04/10/2008   Osteoporosis 10/04/2007   Past Medical History:  Diagnosis Date   Cellulitis of leg, right 08/27/2016   Hyperlipidemia    Osteopenia    Pneumonia    Vitamin D  deficiency     Family History  Problem Relation Age of Onset   Melanoma  Father    Hyperlipidemia Mother    Heart disease Mother        no MI   Diabetes Paternal Grandmother        vision loss   Diabetes Maternal Uncle    Colon cancer Neg Hx    Stomach cancer Neg Hx    Esophageal cancer Neg Hx    Rectal cancer Neg Hx    Stroke Neg Hx     Past Surgical History:  Procedure Laterality Date   COLONOSCOPY  03/2011   Dr Brodie;diminuitive polyp   G 2 P 2     TONSILLECTOMY     Social History   Occupational History   Occupation: Photographer  Tobacco Use   Smoking status: Never   Smokeless tobacco: Never  Vaping Use   Vaping status: Never Used  Substance and Sexual Activity   Alcohol use: Yes    Comment:  1-2 wine or beer nightly   Drug use: No   Sexual activity: Not on file

## 2023-07-31 NOTE — Therapy (Incomplete)
 OUTPATIENT OCCUPATIONAL THERAPY TREATMENT NOTE   Patient Name: Linda [name] MRN: [id number] DOB:12/20/1950, 73 y.o., female Today's Date: 07/31/2023  PCP: Oma Bias, MD REFERRING PROVIDER: Casilda Clayman, PA-C   END OF SESSION:    Past Medical History:  Diagnosis Date   Cellulitis of leg, right 08/27/2016   Hyperlipidemia    Osteopenia    Pneumonia    Vitamin D  deficiency    Past Surgical History:  Procedure Laterality Date   COLONOSCOPY  03/2011   Dr Brodie;diminuitive polyp   G 2 P 2     TONSILLECTOMY     Patient Active Problem List   Diagnosis Date Noted   Macular degeneration 08/25/2022   Pain with swallowing 07/30/2022   Deficiency anemia 04/03/2021   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 04/03/2021   Subacute cough 04/02/2021   Abnormal finding on CT scan 09/04/2020   Aneurysm artery, 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   PMR (polymyalgia rheumatica) (HCC) 02/23/2020   Dizziness 10/26/2018   Loss of transverse plantar arch 10/19/2018   Neuroma of foot 10/19/2018   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  Rationale for Evaluation and Treatment: Rehabilitation  PERTINENT HISTORY: "To start in 1 week-s/p right wrist fx-to work on PROM and AROM *NO LIFTING OR STRENGTHENING YET*" She states having some pain and problems to the broken right wrist, some swelling, etc.  She normally stays very active, doing Zumba, has been trying to avoid these things.  She does state falling yesterday and twisting her ankle, and does have a recent history of dizziness and falls.  She  attended 1 physical therapy visit for this and then stopped attending because she "did not like it."  She was encouraged to get back into physical therapy to work on this problem.  She arrives wearing a prefabricated wrist cock up brace today.  She states that it fits her fairly well  PRECAUTIONS: Fall (OT will use close proximity, has recommended return to physical therapy to work on this risk of falls mixed with dizziness)  RED FLAGS: None   WEIGHT BEARING RESTRICTIONS: Yes nonweightbearing through right wrist and arm now and for the next 6 weeks   SUBJECTIVE:   SUBJECTIVE STATEMENT: She is 5+ weeks s/p Rt DRF with conservative healing in cast.   She states ***.     PAIN:  Are you having pain? Yes: NPRS scale: *** 1-2/10 now, up to 7/10 at worst usually in the night Pain location: Right wrist Pain description: Aching and sore Aggravating factors: Motion and weightbearing Relieving factors: Rest  FALLS: Has patient fallen in last 6 months? Yes. Number of falls 2 at least, does have hx of dizziness   PATIENT GOALS: Improve use of right dominant hand and arm for daily ability  NEXT MD VISIT: 08/05/2023   OBJECTIVE: (All objective assessments below are from initial evaluation on: 07/27/23 unless otherwise specified.)   HAND DOMINANCE: Right   ADLs: Overall ADLs: States decreased ability to grab, hold household objects, pain and difficulty to open containers, perform FMS tasks (manipulate fasteners on clothing), mild to moderate bathing problems as well.  FUNCTIONAL OUTCOME MEASURES: Eval: Patient Rated Wrist Evaluation (PRWE): Pain: 36/50; Function: 39.5/50; Total Score: 75.5/100 (Higher Score  =  More Pain and/or Debility)    UPPER EXTREMITY ROM     Shoulder to Wrist AROM Right eval Left eval Rt 08/03/23  Forearm supination 61  ***  Forearm pronation  76  ***  Wrist flexion 43 80 ***  Wrist extension 48 65 ***  Wrist ulnar deviation 30    Wrist radial deviation 20     (Blank rows = not tested)   Hand AROM Right eval  Full Fist Ability (or Gap to Distal Palmar Crease) LOOSE FULL FIST   Thumb Opposition  (Kapandji Scale)  8/10  (Blank rows = not tested)   UPPER EXTREMITY MMT:    Eval:  NT at eval due to recent and still healing injuries. Will be tested when appropriate.   MMT Right TBD Left TBD  Shoulder flexion    Shoulder abduction    Shoulder adduction    Shoulder extension    Shoulder internal rotation    Shoulder external rotation    Middle trapezius    Lower trapezius    Elbow flexion    Elbow extension    Forearm supination    Forearm pronation    Wrist flexion    Wrist extension    Wrist ulnar deviation    Wrist radial deviation    (Blank rows = not tested)  HAND FUNCTION: Eval: Observed weakness in affected Rt hand. Details TBD when safe  Grip strength Right: TBD lbs, Left: TBD lbs   COORDINATION: 08/03/23: ***   Eval: Observed coordination impairments with affected Rt hand/arm.  Details will be tested in the next few sessions as needed 9 Hole Peg Test Right: TBDsec,  (TBD sec is [facility])   EDEMA:   Eval:  Mildly swollen in right wrist and arm today  OBSERVATIONS:   Eval: Wrist is a bit stiff, swollen, largely nontender.  Hand and thumb seem to be working fairly well today, and she is in very good physical condition otherwise.  Dizziness and falling is a concern and she was encouraged to get back into physical therapy to help with this.   TODAY'S TREATMENT:  08/03/23: *** Review initial home exercise program, ensure that her prefabricated brace is working well for her, and if not a custom orthosis could be fabricated to aid her healing.  Progress per protocols and as safe and tolerated   Post-evaluation treatment:    For safety/self-care she was given the following information to avoid weightbearing more than a pen or pencil in the right hand and arm now and for the next 4 to 6 weeks.  She can attend Zumba classes as  long as they are nonweightbearing and nonpainful to her, and she wears her prefabricated wrist cock up.  She should wear this at all times except for showers and therapist given exercises.  She was given the following home exercises to perform 4 or 5 times a day as tolerated to light tension points, with no pain, and it was explained to her the purpose is to improve and maintain her mobility, not do anything painful or forceful.  She demonstrates some back, but does need multiple directions as she seems to be forgetful today.  Exercises - Reach arms upward   - 4 x daily - 10 reps - Turn Palm Facing Up & Down  - 4-6 x daily - 10-15 reps - Bend and Pull Back Wrist SLOWLY  -  4 x daily - 10-15 reps - "Windshield Wipers"   - 4 x daily - 10-15 reps - Tendon Glides  - 4-6 x daily - 3-5 reps - 2-3 seconds hold - Thumb Opposition  - 4-6 x daily - 10 reps  PATIENT EDUCATION: Education details: See tx section above for details  Person educated: Patient Education method: Engineer, structural, Teach back, Handouts  Education comprehension: States and demonstrates understanding, Additional Education required    HOME EXERCISE PROGRAM: Access Code: [email or web address] URL: [email or web address] Date: 07/27/2023 Prepared by: Leartis Proud    GOALS: Goals reviewed with patient? Yes   SHORT TERM GOALS: (STG required if POC>30 days) Target Date: 08/14/2023  Pt will obtain protective, custom orthotic. Goal status: TBD/PRN  2.  Pt will demo/state understanding of initial HEP to improve pain levels and prerequisite motion. Goal status: INITIAL   LONG TERM GOALS: Target Date: 09/11/23  Pt will improve functional ability by decreased impairment per PRWE assessment from 75.5 to 30 or better, for better quality of life. Goal status: INITIAL  2.  Pt will improve grip strength in right dominant hand from unsafe to test lbs to at least 25 lbs for functional use at home and in IADLs. Goal status:  INITIAL  3.  Pt will improve A/ROM in right wrist flexion/extension from 43/48 respectively to at least 60 degrees each, to have functional motion for tasks like reach and grasp.  Goal status: INITIAL  4.  Pt will improve strength in right wrist flexion/extension from apparent 3 -/5 MMT to at least 4+/5 MMT to have increased functional ability to carry out selfcare and higher-level homecare tasks with less difficulty. Goal status: INITIAL  5.  Pt will improve coordination skills in right hand and arm, as seen by within functional limit score on nine-hole peg testing to have increased functional ability to carry out fine motor tasks (fasteners, etc.) and more complex, coordinated IADLs (meal prep, sports, etc.).  Goal status: INITIAL  6.  Pt will decrease pain at worst from 7/10 to 2/10 or better to have better sleep and occupational participation in daily roles. Goal status: INITIAL   ASSESSMENT:  CLINICAL IMPRESSION: 08/03/23: ***  Eval: Patient is a 73 y.o. female who was seen today for occupational therapy evaluation for fractured right distal radius, swelling, stiffness, weakness and decreased functional ability and knowledge of safety precautions.  She will benefit from outpatient occupational therapy to decrease symptoms and increase quality of life.       PLAN:  OT FREQUENCY: 1-2x/week  OT DURATION: 6 weeks through 09/11/2023 and up to 10 total visits as needed  PLANNED INTERVENTIONS: 97168 OT Re-evaluation, 97535 self care/ADL training, 40981 therapeutic exercise, 97530 therapeutic activity, 97112 neuromuscular re-education, 97140 manual therapy, 97035 ultrasound, 97039 fluidotherapy, 97010 moist heat, 97010 cryotherapy, 97760 Orthotic Initial, 97763 Orthotic/Prosthetic subsequent, passive range of motion, Dry needling, energy conservation, coping strategies training, and patient/family education  RECOMMENDED OTHER SERVICES: Recommended to return to physical therapy to work on  dizziness and possibly vertigo issues, as she keeps falling and remains a fall risk  CONSULTED AND AGREED WITH PLAN OF CARE: Patient  PLAN FOR NEXT SESSION:   ***  Leartis Proud, OTR/L, CHT 07/31/2023, 8:39 AM

## 2023-08-03 ENCOUNTER — Encounter: Admitting: Rehabilitative and Restorative Service Providers"

## 2023-08-05 ENCOUNTER — Ambulatory Visit: Admitting: Orthopedic Surgery

## 2023-08-11 NOTE — Therapy (Signed)
 OUTPATIENT OCCUPATIONAL THERAPY TREATMENT NOTE   Patient Name: Linda [name] MRN: [id number] DOB:08-Apr-1950, 73 y.o., female Today's Date: 08/12/2023  PCP: Oma Bias, MD REFERRING PROVIDER: Casilda Clayman, PA-C   END OF SESSION:  OT End of Session - 08/12/23 1258     Visit Number 2    Number of Visits 10    Date for OT Re-Evaluation 09/11/23    Authorization Type [employer]    OT Start Time 1300    OT Stop Time 1338    OT Time Calculation (min) 38 min    Activity Tolerance Patient limited by fatigue;No increased pain;Patient tolerated treatment well;Patient limited by pain    Behavior During Therapy [facility] for tasks assessed/performed              Past Medical History:  Diagnosis Date   Cellulitis of leg, right 08/27/2016   Hyperlipidemia    Osteopenia    Pneumonia    Vitamin D  deficiency    Past Surgical History:  Procedure Laterality Date   COLONOSCOPY  03/2011   Dr Brodie;diminuitive polyp   G 2 P 2     TONSILLECTOMY     Patient Active Problem List   Diagnosis Date Noted   Macular degeneration 08/25/2022   Pain with swallowing 07/30/2022   Deficiency anemia 04/03/2021   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 04/03/2021   Subacute cough 04/02/2021   Abnormal finding on CT scan 09/04/2020   Aneurysm artery, 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   PMR (polymyalgia rheumatica) (HCC) 02/23/2020   Dizziness 10/26/2018   Loss of transverse plantar arch 10/19/2018   Neuroma of foot 10/19/2018   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   SI (sacroiliac) joint dysfunction 12/16/2017   Upper airway cough syndrome 11/17/2017   History of colonic polyps 05/22/2014   Prediabetes 09/04/2013   Allergic rhinitis 08/06/2013   Generalized anxiety disorder 11/29/2009   Vitamin D  deficiency 04/10/2008   Osteoporosis 10/04/2007    ONSET DATE: DOI 06/27/23 (fall)   REFERRING DIAG: M25.531 (ICD-10-CM) - Pain in right wrist   THERAPY DIAG:  Stiffness of right wrist, not elsewhere  classified  Other lack of coordination  Muscle weakness (generalized)  Pain in right wrist  Localized edema  Rationale for Evaluation and Treatment: Rehabilitation  PERTINENT HISTORY: "To start in 1 week-s/p right wrist fx-to work on PROM and AROM *NO LIFTING OR STRENGTHENING YET*" She states having some pain and problems to the broken right wrist, some swelling, etc.  She normally stays very active, doing Zumba, has been trying to avoid these things.  She does state falling yesterday and twisting her ankle, and does have a recent history of dizziness and falls.  She attended 1 physical therapy visit for this and then stopped attending because she "did not like it."  She was encouraged to get back into physical therapy to work on this problem.  She arrives wearing a prefabricated wrist cock up brace today.  She states that it fits her fairly well  PRECAUTIONS: Fall (OT will use close proximity, has recommended return to physical therapy to work on this risk of falls mixed with dizziness)  RED FLAGS: None   WEIGHT BEARING RESTRICTIONS: Yes nonweightbearing through right wrist and arm now and for the next 6 weeks   SUBJECTIVE:   SUBJECTIVE STATEMENT: She is 6+ weeks s/p Rt DRF with conservative healing in cast.   She states having a bit of soreness, but none now.  She states she was more comfortable wearing her brace because she  was aching when she took it off.  She has been careful to not lift any weights.  She did miss last week's appointment due to having to babysit.     PAIN:  Are you having pain? Yes: NPRS scale: 0/10 now, up to 4/10 at worst usually in the night Pain location: Right wrist Pain description: Aching and sore Aggravating factors: Motion and weightbearing Relieving factors: Rest  FALLS: Has patient fallen in last 6 months? Yes. Number of falls 2 at least, does have hx of dizziness   PATIENT GOALS: Improve use of right dominant hand and arm for daily  ability  NEXT MD VISIT: 08/05/2023   OBJECTIVE: (All objective assessments below are from initial evaluation on: 07/27/23 unless otherwise specified.)   HAND DOMINANCE: Right   ADLs: Overall ADLs: States decreased ability to grab, hold household objects, pain and difficulty to open containers, perform FMS tasks (manipulate fasteners on clothing), mild to moderate bathing problems as well.    FUNCTIONAL OUTCOME MEASURES: Eval: Patient Rated Wrist Evaluation (PRWE): Pain: 36/50; Function: 39.5/50; Total Score: 75.5/100 (Higher Score  =  More Pain and/or Debility)    UPPER EXTREMITY ROM     Shoulder to Wrist AROM Right eval Left eval Rt 08/12/23  Forearm supination 61  51  Forearm pronation  76  71  Wrist flexion 43 80 30  Wrist extension 48 65 45  Wrist ulnar deviation 30  31  Wrist radial deviation 20  20  (Blank rows = not tested)   Hand AROM Right eval  Full Fist Ability (or Gap to Distal Palmar Crease) LOOSE FULL FIST   Thumb Opposition  (Kapandji Scale)  8/10  (Blank rows = not tested)   UPPER EXTREMITY MMT:    Eval:  NT at eval due to recent and still healing injuries. Will be tested when appropriate.   MMT Right TBD Left TBD  Shoulder flexion    Shoulder abduction    Shoulder adduction    Shoulder extension    Shoulder internal rotation    Shoulder external rotation    Middle trapezius    Lower trapezius    Elbow flexion    Elbow extension    Forearm supination    Forearm pronation    Wrist flexion    Wrist extension    Wrist ulnar deviation    Wrist radial deviation    (Blank rows = not tested)  HAND FUNCTION: Eval: Observed weakness in affected Rt hand. Details TBD when safe  Grip strength Right: TBD lbs, Left: TBD lbs   COORDINATION: TBD   Eval: Observed coordination impairments with affected Rt hand/arm.  Details will be tested in the next few sessions as needed 9 Hole Peg Test Right: TBDsec,  (TBD sec is [facility])   EDEMA:   Eval:  Mildly  swollen in right wrist and arm today  OBSERVATIONS:   Eval: Wrist is a bit stiff, swollen, largely nontender.  Hand and thumb seem to be working fairly well today, and she is in very good physical condition otherwise.  Dizziness and falling is a concern and she was encouraged to get back into physical therapy to help with this.   TODAY'S TREATMENT:  08/12/23: She starts with active range of motion for new measures which show some stiffness in the wrist and the forearm perhaps from not doing exercises and now for fear or wearing her brace too much.  We discussed her home exercise program while she is on moist heat for  4 minutes, which she states is relieving.  OT then does IASTM manual therapy over the dorsum of the wrist and forearm to help loosen tight tissues.  She then performs her home exercise routine with new stretches that OT educates on today and performs with her.  OT was careful to have these be nonpainful, and encouraged her to never cause pain by forcing the joint too much.  She should feel a nice little stretch.  She states feeling better at the end of the session than the beginning of the session.  She leaves stating no more questions and should try to attend therapy next week as scheduled.  Exercises - Reach arms upward   - 4 x daily - 10 reps - Turn [employer] Facing Up & Down  - 4-6 x daily - 10-15 reps - Bend and Pull Back Wrist SLOWLY  - 4 x daily - 10-15 reps - "Windshield Wipers"   - 4 x daily - 10-15 reps - Tendon Glides  - 4-6 x daily - 3-5 reps - 2-3 seconds hold - Thumb Opposition  - 4-6 x daily - 10 reps - Forearm Supination Stretch  - 3-4 x daily - 3-5 reps - 15 sec hold - Forearm Pronation Stretch  - 3-4 x daily - 3-5 reps - 15 sec hold - Wrist Flexion Stretch  - 4 x daily - 3-5 reps - 15 sec hold - Wrist Prayer Stretch  - 4 x daily - 3-5 reps - 15 sec hold  PATIENT EDUCATION: Education details: See tx section above for details  Person educated: Patient Education method:  Verbal Instruction, Teach back, Handouts  Education comprehension: States and demonstrates understanding, Additional Education required    HOME EXERCISE PROGRAM: Access Code: [email or web address] URL: [email or web address] Date: 07/27/2023 Prepared by: Leartis Proud    GOALS: Goals reviewed with patient? Yes   SHORT TERM GOALS: (STG required if POC>30 days) Target Date: 08/14/2023  Pt will obtain protective, custom orthotic. Goal status: TBD/PRN  2.  Pt will demo/state understanding of initial HEP to improve pain levels and prerequisite motion. Goal status: INITIAL   LONG TERM GOALS: Target Date: 09/11/23  Pt will improve functional ability by decreased impairment per PRWE assessment from 75.5 to 30 or better, for better quality of life. Goal status: INITIAL  2.  Pt will improve grip strength in right dominant hand from unsafe to test lbs to at least 25 lbs for functional use at home and in IADLs. Goal status: INITIAL  3.  Pt will improve A/ROM in right wrist flexion/extension from 43/48 respectively to at least 60 degrees each, to have functional motion for tasks like reach and grasp.  Goal status: INITIAL  4.  Pt will improve strength in right wrist flexion/extension from apparent 3 -/5 MMT to at least 4+/5 MMT to have increased functional ability to carry out selfcare and higher-level homecare tasks with less difficulty. Goal status: INITIAL  5.  Pt will improve coordination skills in right hand and arm, as seen by within functional limit score on nine-hole peg testing to have increased functional ability to carry out fine motor tasks (fasteners, etc.) and more complex, coordinated IADLs (meal prep, sports, etc.).  Goal status: INITIAL  6.  Pt will decrease pain at worst from 7/10 to 2/10 or better to have better sleep and occupational participation in daily roles. Goal status: INITIAL   ASSESSMENT:  CLINICAL IMPRESSION: 08/12/23: She is doing well with new  stretches, she did miss therapy last week  so we have not gotten a chance to do too much at, but she has needed time to heal.  Carry on  Eval: Patient is a 73 y.o. female who was seen today for occupational therapy evaluation for fractured right distal radius, swelling, stiffness, weakness and decreased functional ability and knowledge of safety precautions.  She will benefit from outpatient occupational therapy to decrease symptoms and increase quality of life.       PLAN:  OT FREQUENCY: 1-2x/week  OT DURATION: 6 weeks through 09/11/2023 and up to 10 total visits as needed  PLANNED INTERVENTIONS: 97168 OT Re-evaluation, 97535 self care/ADL training, 86578 therapeutic exercise, 97530 therapeutic activity, 97112 neuromuscular re-education, 97140 manual therapy, 97035 ultrasound, 97039 fluidotherapy, 97010 moist heat, 97010 cryotherapy, 97760 Orthotic Initial, 97763 Orthotic/Prosthetic subsequent, passive range of motion, Dry needling, energy conservation, coping strategies training, and patient/family education  RECOMMENDED OTHER SERVICES: Recommended to return to physical therapy to work on dizziness and possibly vertigo issues, as she keeps falling and remains a fall risk  CONSULTED AND AGREED WITH PLAN OF CARE: Patient  PLAN FOR NEXT SESSION:   Check new stretches, check MD notes and new x-ray, upgraded light hand strengthening as tolerated.  Leartis Proud, OTR/L, CHT 08/12/2023, 1:41 PM

## 2023-08-12 ENCOUNTER — Ambulatory Visit: Admitting: Orthopedic Surgery

## 2023-08-12 ENCOUNTER — Ambulatory Visit: Admitting: Rehabilitative and Restorative Service Providers"

## 2023-08-12 ENCOUNTER — Encounter: Payer: Self-pay | Admitting: Rehabilitative and Restorative Service Providers"

## 2023-08-12 DIAGNOSIS — R278 Other lack of coordination: Secondary | ICD-10-CM | POA: Diagnosis not present

## 2023-08-12 DIAGNOSIS — M25531 Pain in right wrist: Secondary | ICD-10-CM | POA: Diagnosis not present

## 2023-08-12 DIAGNOSIS — M6281 Muscle weakness (generalized): Secondary | ICD-10-CM

## 2023-08-12 DIAGNOSIS — R600 Localized edema: Secondary | ICD-10-CM | POA: Diagnosis not present

## 2023-08-12 DIAGNOSIS — M25572 Pain in left ankle and joints of left foot: Secondary | ICD-10-CM

## 2023-08-12 DIAGNOSIS — M25631 Stiffness of right wrist, not elsewhere classified: Secondary | ICD-10-CM | POA: Diagnosis not present

## 2023-08-12 NOTE — Therapy (Signed)
 OUTPATIENT OCCUPATIONAL THERAPY TREATMENT NOTE   Patient Name: Linda [name] MRN: [id number] DOB:01-May-1950, 73 y.o., female Today's Date: 08/18/2023  PCP: Oma Bias, MD REFERRING PROVIDER: Casilda Clayman, PA-C   END OF SESSION:  OT End of Session - 08/18/23 1304     Visit Number 3    Number of Visits 10    Date for OT Re-Evaluation 09/11/23    Authorization Type [employer]    OT Start Time 1304    OT Stop Time 1339    OT Time Calculation (min) 35 min    Activity Tolerance Patient limited by fatigue;No increased pain;Patient tolerated treatment well;Patient limited by pain    Behavior During Therapy [facility] for tasks assessed/performed               Past Medical History:  Diagnosis Date   Cellulitis of leg, right 08/27/2016   Hyperlipidemia    Osteopenia    Pneumonia    Vitamin D  deficiency    Past Surgical History:  Procedure Laterality Date   COLONOSCOPY  03/2011   Dr Brodie;diminuitive polyp   G 2 P 2     TONSILLECTOMY     Patient Active Problem List   Diagnosis Date Noted   Macular degeneration 08/25/2022   Pain with swallowing 07/30/2022   Deficiency anemia 04/03/2021   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 04/03/2021   Subacute cough 04/02/2021   Abnormal finding on CT scan 09/04/2020   Aneurysm artery, 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   PMR (polymyalgia rheumatica) (HCC) 02/23/2020   Dizziness 10/26/2018   Loss of transverse plantar arch 10/19/2018   Neuroma of foot 10/19/2018   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   SI (sacroiliac) joint dysfunction 12/16/2017   Upper airway cough syndrome 11/17/2017   History of colonic polyps 05/22/2014   Prediabetes 09/04/2013   Allergic rhinitis 08/06/2013   Generalized anxiety disorder 11/29/2009   Vitamin D  deficiency 04/10/2008   Osteoporosis 10/04/2007    ONSET DATE: DOI 06/27/23 (fall)   REFERRING DIAG: M25.531 (ICD-10-CM) - Pain in right wrist   THERAPY DIAG:  Stiffness of right wrist, not elsewhere  classified  Other lack of coordination  Muscle weakness (generalized)  Pain in right wrist  Localized edema  Rationale for Evaluation and Treatment: Rehabilitation  PERTINENT HISTORY: "To start in 1 week-s/p right wrist fx-to work on PROM and AROM *NO LIFTING OR STRENGTHENING YET*" She states having some pain and problems to the broken right wrist, some swelling, etc.  She normally stays very active, doing Zumba, has been trying to avoid these things.  She does state falling yesterday and twisting her ankle, and does have a recent history of dizziness and falls.  She attended 1 physical therapy visit for this and then stopped attending because she "did not like it."  She was encouraged to get back into physical therapy to work on this problem.  She arrives wearing a prefabricated wrist cock up brace today.  She states that it fits her fairly well  PRECAUTIONS: Fall (OT will use close proximity, has recommended return to physical therapy to work on this risk of falls mixed with dizziness)  RED FLAGS: None   WEIGHT BEARING RESTRICTIONS: Yes nonweightbearing through right wrist and arm now and for the next 6 weeks   SUBJECTIVE:   SUBJECTIVE STATEMENT: She is 7+ weeks s/p Rt DRF with conservative healing in cast.   She states that her doctor's appointment went well, but he still has 5 pound restriction that can go up to 15  pounds over the next 2 to 3 weeks.    PAIN:  Are you having pain? Yes: NPRS scale: 7-8/10 now (she states "it is a little achy") Pain location: Right wrist Pain description: Aching and sore Aggravating factors: Motion and weightbearing Relieving factors: Rest  FALLS: Has patient fallen in last 6 months? Yes. Number of falls 2 at least, does have hx of dizziness   PATIENT GOALS: Improve use of right dominant hand and arm for daily ability  NEXT MD VISIT: 08/05/2023   OBJECTIVE: (All objective assessments below are from initial evaluation on: 07/27/23 unless  otherwise specified.)   HAND DOMINANCE: Right   ADLs: Overall ADLs: States decreased ability to grab, hold household objects, pain and difficulty to open containers, perform FMS tasks (manipulate fasteners on clothing), mild to moderate bathing problems as well.    FUNCTIONAL OUTCOME MEASURES: Eval: Patient Rated Wrist Evaluation (PRWE): Pain: 36/50; Function: 39.5/50; Total Score: 75.5/100 (Higher Score  =  More Pain and/or Debility)    UPPER EXTREMITY ROM     Shoulder to Wrist AROM Right eval Left eval Rt 08/12/23 Rt 08/18/23  Forearm supination 61  51 64  Forearm pronation  76  71   Wrist flexion 43 80 30 56  Wrist extension 48 65 45 50  Wrist ulnar deviation 30  31   Wrist radial deviation 20  20   (Blank rows = not tested)   Hand AROM Right eval  Full Fist Ability (or Gap to Distal Palmar Crease) LOOSE FULL FIST   Thumb Opposition  (Kapandji Scale)  8/10  (Blank rows = not tested)   UPPER EXTREMITY MMT:    Eval:  NT at eval due to recent and still healing injuries. Will be tested when appropriate.   MMT Right TBD Left TBD  Shoulder flexion    Shoulder abduction    Shoulder adduction    Shoulder extension    Shoulder internal rotation    Shoulder external rotation    Middle trapezius    Lower trapezius    Elbow flexion    Elbow extension    Forearm supination    Forearm pronation    Wrist flexion    Wrist extension    Wrist ulnar deviation    Wrist radial deviation    (Blank rows = not tested)  HAND FUNCTION: Eval: Observed weakness in affected Rt hand. Details TBD when safe  Grip strength Right: TBD lbs, Left: TBD lbs   COORDINATION:  Eval: Observed coordination impairments with affected Rt hand/arm.  Details will be tested in the next few sessions as needed 9 Hole Peg Test Right: TBDsec,  (TBD sec is [facility])   EDEMA:   Eval:  Mildly swollen in right wrist and arm today  OBSERVATIONS:   Eval: Wrist is a bit stiff, swollen, largely nontender.   Hand and thumb seem to be working fairly well today, and she is in very good physical condition otherwise.  Dizziness and falling is a concern and she was encouraged to get back into physical therapy to help with this.   TODAY'S TREATMENT:  08/18/23: New active range of motion exercises today reveal improving active range of motion measurements.  To help with increased pain and achiness to the cold and raining weather, OT puts her on moist heat for 5 minutes while reviewing her home exercises with her.  OT updates them to the list below and added on for new isometric training exercises that should be done without pain, carefully,  very lightly.  She does have some apparent recall difficulties, so OT helps her with her stretches, ensuring good performance and pain-free.  She then performs isometrics in the 4 ways listed below for dynamic wrist stability, she can do these without pain but has the most difficulty with wrist flexion.  When she performs this more lightly, it is pain-free.  OT gives her good written directions today in case she has a lapse of memory, and she should stop these if they are painful to her during the week.  She should continue to wean from her brace for light under 5 pound activities minimally throughout the day, but wear it anytime she is hurting, anytime she is out of the home, and at night.  She states understanding and states "I feel much better now" at the end of the session.      Exercises - Forearm Supination Stretch  - 3-4 x daily - 3-5 reps - 15 sec hold - Forearm Pronation Stretch  - 3-4 x daily - 3-5 reps - 15 sec hold - Wrist Flexion Stretch  - 4 x daily - 3-5 reps - 15 sec hold - Wrist Prayer Stretch  - 4 x daily - 3-5 reps - 15 sec hold - Isometric Wrist Extension Pronated  - 2-3 x daily - 4-5 x weekly - 5 reps - 5-10 seconds hold - Seated Isometric Wrist Radial Deviation with Manual Resistance  - 4-6 x daily - 1 sets - 10-15 reps - Seated Isometric Wrist Flexion  Supinated with Manual Resistance  - 4-6 x daily - 1 sets - 10-15 reps - Seated Isometric Wrist Ulnar Deviation with Manual Resistance  - 2-3 x daily - 4-5 x weekly - 5 reps - 5-10 hold  PATIENT EDUCATION: Education details: See tx section above for details  Person educated: Patient Education method: Engineer, structural, Teach back, Handouts  Education comprehension: States and demonstrates understanding, Additional Education required    HOME EXERCISE PROGRAM: Access Code: [email or web address] URL: [email or web address] Date: 07/27/2023 Prepared by: Leartis Proud    GOALS: Goals reviewed with patient? Yes   SHORT TERM GOALS: (STG required if POC>30 days) Target Date: 08/14/2023  Pt will obtain protective, custom orthotic. Goal status: TBD/PRN  2.  Pt will demo/state understanding of initial HEP to improve pain levels and prerequisite motion. Goal status: INITIAL   LONG TERM GOALS: Target Date: 09/11/23  Pt will improve functional ability by decreased impairment per PRWE assessment from 75.5 to 30 or better, for better quality of life. Goal status: INITIAL  2.  Pt will improve grip strength in right dominant hand from unsafe to test lbs to at least 25 lbs for functional use at home and in IADLs. Goal status: INITIAL  3.  Pt will improve A/ROM in right wrist flexion/extension from 43/48 respectively to at least 60 degrees each, to have functional motion for tasks like reach and grasp.  Goal status: INITIAL  4.  Pt will improve strength in right wrist flexion/extension from apparent 3 -/5 MMT to at least 4+/5 MMT to have increased functional ability to carry out selfcare and higher-level homecare tasks with less difficulty. Goal status: INITIAL  5.  Pt will improve coordination skills in right hand and arm, as seen by within functional limit score on nine-hole peg testing to have increased functional ability to carry out fine motor tasks (fasteners, etc.) and more complex,  coordinated IADLs (meal prep, sports, etc.).  Goal status: INITIAL  6.  Pt will decrease pain  at worst from 7/10 to 2/10 or better to have better sleep and occupational participation in daily roles. Goal status: INITIAL   ASSESSMENT:  CLINICAL IMPRESSION: 08/18/23: Today she tolerated light strengthening well and had less pain and soreness after moist heat, manual therapy and exercises.  Will continue on carefully  08/12/23: She is doing well with new stretches, she did miss therapy last week so we have not gotten a chance to do too much at, but she has needed time to heal.  Carry on  Eval: Patient is a 73 y.o. female who was seen today for occupational therapy evaluation for fractured right distal radius, swelling, stiffness, weakness and decreased functional ability and knowledge of safety precautions.  She will benefit from outpatient occupational therapy to decrease symptoms and increase quality of life.       PLAN:  OT FREQUENCY: 1-2x/week  OT DURATION: 6 weeks through 09/11/2023 and up to 10 total visits as needed  PLANNED INTERVENTIONS: 97168 OT Re-evaluation, 97535 self care/ADL training, 96045 therapeutic exercise, 97530 therapeutic activity, 97112 neuromuscular re-education, 97140 manual therapy, 97035 ultrasound, 97039 fluidotherapy, 97010 moist heat, 97010 cryotherapy, 97760 Orthotic Initial, 97763 Orthotic/Prosthetic subsequent, passive range of motion, Dry needling, energy conservation, coping strategies training, and patient/family education  RECOMMENDED OTHER SERVICES: Recommended to return to physical therapy to work on dizziness and possibly vertigo issues, as she keeps falling and remains a fall risk  CONSULTED AND AGREED WITH PLAN OF CARE: Patient  PLAN FOR NEXT SESSION:   Check new isometric wrist training consider starting light hand weights less than 5 pounds.  Continue to help manage pain and stiffness  Leartis Proud, OTR/L, CHT 08/18/2023, 1:44 PM

## 2023-08-13 ENCOUNTER — Encounter: Payer: Self-pay | Admitting: Orthopedic Surgery

## 2023-08-13 NOTE — Progress Notes (Signed)
 Post-Op Visit Note   Patient: Linda [name]           Date of Birth: [birth year]           MRN: [id number] Visit Date: 08/12/2023 PCP: Colene Dauphin, MD   Assessment & Plan:  Chief Complaint:  Chief Complaint  Patient presents with   Right Wrist - Follow-up    DOI 07/26/2023   Left Ankle - Follow-up    DOI 07/26/2023   Visit Diagnoses:  1. Pain in left ankle and joints of left foot   2. Pain in right wrist     Plan: Patient presents for follow-up of right wrist fracture and left ankle sprain.  She has been using a brace for the right wrist.  She is about 4 weeks out.  Her ankle is doing well.  Swelling is decreased.  The wrist also is improving with decreased swelling and increased range of motion.  She has been in occupational therapy.  I think she is fine to spend time out of the brace at this time.  No lifting more than 5 pounds for the first week or 2 from now and then she can increase as tolerated up to but not beyond 15 pounds.  4-week return for clinical recheck on the wrist.  Follow-Up Instructions: No follow-ups on file.   Orders:  No orders of the defined types were placed in this encounter.  No orders of the defined types were placed in this encounter.   Imaging: No results found.  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   SI (sacroiliac) joint dysfunction 12/16/2017   Upper airway cough syndrome 11/17/2017   History of colonic polyps 05/22/2014   Prediabetes 09/04/2013   Allergic rhinitis 08/06/2013    Generalized anxiety disorder 11/29/2009   Vitamin D  deficiency 04/10/2008   Osteoporosis 10/04/2007   Past Medical History:  Diagnosis Date   Cellulitis of leg, right 08/27/2016   Hyperlipidemia    Osteopenia    Pneumonia    Vitamin D  deficiency     Family History  Problem Relation Age of Onset   Melanoma Father    Hyperlipidemia Mother    Heart disease Mother        no MI   Diabetes Paternal Grandmother        vision loss   Diabetes Maternal Uncle    Colon cancer Neg Hx    Stomach cancer Neg Hx    Esophageal cancer Neg Hx    Rectal cancer Neg Hx    Stroke Neg Hx     Past Surgical History:  Procedure Laterality Date   COLONOSCOPY  03/2011   Dr Brodie;diminuitive polyp   G 2 P 2     TONSILLECTOMY     Social History   Occupational History   Occupation: Photographer  Tobacco Use   Smoking status: Never   Smokeless tobacco: Never  Vaping Use   Vaping status: Never Used  Substance and Sexual Activity   Alcohol use: Yes    Comment:  1-2 wine  or beer nightly   Drug use: No   Sexual activity: Not on file

## 2023-08-18 ENCOUNTER — Encounter: Payer: Self-pay | Admitting: Internal Medicine

## 2023-08-18 ENCOUNTER — Encounter: Payer: Self-pay | Admitting: Rehabilitative and Restorative Service Providers"

## 2023-08-18 ENCOUNTER — Ambulatory Visit (INDEPENDENT_AMBULATORY_CARE_PROVIDER_SITE_OTHER): Admitting: Internal Medicine

## 2023-08-18 ENCOUNTER — Ambulatory Visit: Admitting: Rehabilitative and Restorative Service Providers"

## 2023-08-18 VITALS — BP 130/82 | HR 67 | Temp 98.0°F | Ht 64.0 in | Wt 127.0 lb

## 2023-08-18 DIAGNOSIS — M6281 Muscle weakness (generalized): Secondary | ICD-10-CM | POA: Diagnosis not present

## 2023-08-18 DIAGNOSIS — M25631 Stiffness of right wrist, not elsewhere classified: Secondary | ICD-10-CM

## 2023-08-18 DIAGNOSIS — T7840XA Allergy, unspecified, initial encounter: Secondary | ICD-10-CM | POA: Insufficient documentation

## 2023-08-18 DIAGNOSIS — L03116 Cellulitis of left lower limb: Secondary | ICD-10-CM | POA: Insufficient documentation

## 2023-08-18 DIAGNOSIS — R600 Localized edema: Secondary | ICD-10-CM

## 2023-08-18 DIAGNOSIS — R278 Other lack of coordination: Secondary | ICD-10-CM | POA: Diagnosis not present

## 2023-08-18 DIAGNOSIS — M25531 Pain in right wrist: Secondary | ICD-10-CM | POA: Diagnosis not present

## 2023-08-18 MED ORDER — PREDNISONE 20 MG PO TABS
20.0000 mg | ORAL_TABLET | Freq: Every day | ORAL | 0 refills | Status: AC
Start: 2023-08-18 — End: 2023-08-21

## 2023-08-18 MED ORDER — CEPHALEXIN 500 MG PO CAPS: 500.0000 mg | ORAL_CAPSULE | Freq: Three times a day (TID) | ORAL | 0 refills | Status: AC

## 2023-08-18 NOTE — Assessment & Plan Note (Signed)
 Possible allergic reaction between left 4th-5th toes with itching, erythema and pain-now extending to dorsal aspect of foot and seems to be getting worse Unsure of what she was exposed to Can continue cortisone cream topically twice a day Start Claritin or Allegra daily Prednisone  20 mg daily x 3 days Will give an antibiotic to cover for possible cellulitis

## 2023-08-18 NOTE — Assessment & Plan Note (Signed)
 Acute Looks like she may have had an allergic reaction to something on her left foot, but also concern for possible cellulitis-there is a small cut between her 4th and 5th toes Start Keflex  500 mg 3 times daily x 5 days She will update me with her progress so we can adjust if needed

## 2023-08-18 NOTE — Patient Instructions (Addendum)
       Medications changes include :   prednisone  20 mg daily x 3 days, cephalexin  500 mg three times a day.  Start claritin or allegra Use the cortisone topically twice a day as needed     Return if symptoms worsen or fail to improve.

## 2023-08-18 NOTE — Progress Notes (Signed)
    Subjective:    Patient ID: Linda [name], female    DOB: Aug 17, 1950, 73 y.o.   MRN: [id number]      HPI Linda [name] is here for  Chief Complaint  Patient presents with   Foot itching    Left foot itching in between 4th and 5th toe    2 days ago started having itching between her left 4-5th toes  -itchy and painful.  In between her toes was red and the skin has started to peel.  Today she noticed the top of her foot near those toes is red and itchy as well.  There is some mild swelling on the top of her foot.  She denies any injuries or obvious bites or new exposures.  A few days ago she was wearing open shoes and she does not recall getting bit or being exposed to anything  No fever/chlls.    Putting on cortisone cream on it and neosporin ointment.  There does appear to be a slight cut in between her toes     Medications and allergies reviewed with patient and updated if appropriate.  Current Outpatient Medications on File Prior to Visit  Medication Sig Dispense Refill   Ascorbic Acid (VITAMIN C) 1000 MG tablet Take 1,000 mg by mouth daily.     Calcium Carb-Cholecalciferol (CALCIUM 1000 + D PO) Take by mouth daily.     cholecalciferol (VITAMIN D ) 1000 units tablet Take 1.5 tablets (1,500 Units total) by mouth daily. 90 tablet 2   famotidine  (PEPCID ) 40 MG tablet Take 40 mg by mouth 2 (two) times daily. (Patient not taking: Reported on 08/18/2023)     fluticasone  (FLONASE ) 50 MCG/ACT nasal spray Place into the nose. (Patient not taking: Reported on 08/18/2023)     No current facility-administered medications on file prior to visit.    Review of Systems     Objective:   Vitals:   08/18/23 1514  BP: 130/82  Pulse: 67  Temp: 98 F (36.7 C)  SpO2: 95%   BP Readings from Last 3 Encounters:  08/18/23 130/82  05/26/23 130/80  04/28/23 108/78   Wt Readings from Last 3 Encounters:  08/18/23 127 lb (57.6 kg)  05/26/23 128 lb (58.1 kg)  04/28/23 130 lb (59 kg)   Body  mass index is 21.8 kg/m.    Physical Exam Constitutional:      General: She is not in acute distress.    Appearance: Normal appearance. She is not ill-appearing.  HENT:     Head: Normocephalic and atraumatic.  Skin:    General: Skin is warm and dry.     Comments: Between left 4th and 5th toe there is erythema and peeling skin likely related to moisture.  There is a very tiny cut between the 2 toes without active bleeding or discharge.  Dorsal aspect of the lateral foot near her toes is erythematous-extends slightly towards proximal foot and medial side.  Minimal swelling  Neurological:     Mental Status: She is alert.            Assessment & Plan:    See Problem List for Assessment and Plan of chronic medical problems.

## 2023-08-20 DIAGNOSIS — Z1331 Encounter for screening for depression: Secondary | ICD-10-CM | POA: Diagnosis not present

## 2023-08-20 DIAGNOSIS — Z01419 Encounter for gynecological examination (general) (routine) without abnormal findings: Secondary | ICD-10-CM | POA: Diagnosis not present

## 2023-08-21 ENCOUNTER — Encounter: Payer: Self-pay | Admitting: Internal Medicine

## 2023-08-21 ENCOUNTER — Ambulatory Visit: Admitting: Internal Medicine

## 2023-08-21 VITALS — BP 120/72 | HR 80 | Temp 98.0°F | Ht 64.0 in

## 2023-08-21 DIAGNOSIS — T7840XD Allergy, unspecified, subsequent encounter: Secondary | ICD-10-CM

## 2023-08-21 NOTE — Patient Instructions (Addendum)
    Finish the antibiotic and monitor the area closely.      Return if symptoms worsen or fail to improve.

## 2023-08-21 NOTE — Therapy (Signed)
 OUTPATIENT OCCUPATIONAL THERAPY TREATMENT NOTE   Patient Name: Linda [name] MRN: [id number] DOB:02-06-1951, 73 y.o., female Today's Date: 08/24/2023  PCP: Oma Bias, MD REFERRING PROVIDER: Casilda Clayman, PA-C   END OF SESSION:  OT End of Session - 08/24/23 1301     Visit Number 4    Number of Visits 10    Date for OT Re-Evaluation 09/11/23    Authorization Type [employer]    OT Start Time 1301    OT Stop Time 1349    OT Time Calculation (min) 48 min    Equipment Utilized During Treatment red t-band    Activity Tolerance Patient limited by fatigue;No increased pain;Patient tolerated treatment well;Patient limited by pain    Behavior During Therapy [facility] for tasks assessed/performed                Past Medical History:  Diagnosis Date   Cellulitis of leg, right 08/27/2016   Hyperlipidemia    Osteopenia    Pneumonia    Vitamin D  deficiency    Past Surgical History:  Procedure Laterality Date   COLONOSCOPY  03/2011   Dr Brodie;diminuitive polyp   G 2 P 2     TONSILLECTOMY     Patient Active Problem List   Diagnosis Date Noted   Allergic reaction 08/18/2023   Cellulitis of left foot 08/18/2023   Macular degeneration 08/25/2022   Pain with swallowing 07/30/2022   Deficiency anemia 04/03/2021   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 04/03/2021   Subacute cough 04/02/2021   Abnormal finding on CT scan 09/04/2020   Aneurysm artery, 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   PMR (polymyalgia rheumatica) (HCC) 02/23/2020   Dizziness 10/26/2018   Loss of transverse plantar arch 10/19/2018   Neuroma of foot 10/19/2018   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   SI (sacroiliac) joint dysfunction 12/16/2017   Upper airway cough syndrome 11/17/2017   History of colonic polyps 05/22/2014   Prediabetes 09/04/2013   Allergic rhinitis 08/06/2013   Generalized anxiety disorder 11/29/2009   Vitamin D  deficiency 04/10/2008   Osteoporosis 10/04/2007    ONSET DATE: DOI 06/27/23 (fall)    REFERRING DIAG: M25.531 (ICD-10-CM) - Pain in right wrist   THERAPY DIAG:  Stiffness of right wrist, not elsewhere classified  Other lack of coordination  Muscle weakness (generalized)  Pain in right wrist  Localized edema  Rationale for Evaluation and Treatment: Rehabilitation  PERTINENT HISTORY: "To start in 1 week-s/p right wrist fx-to work on PROM and AROM *NO LIFTING OR STRENGTHENING YET*" She states having some pain and problems to the broken right wrist, some swelling, etc.  She normally stays very active, doing Zumba, has been trying to avoid these things.  She does state falling yesterday and twisting her ankle, and does have a recent history of dizziness and falls.  She attended 1 physical therapy visit for this and then stopped attending because she "did not like it."  She was encouraged to get back into physical therapy to work on this problem.  She arrives wearing a prefabricated wrist cock up brace today.  She states that it fits her fairly well  PRECAUTIONS: Fall (OT will use close proximity, has recommended return to physical therapy to work on this risk of falls mixed with dizziness)  RED FLAGS: None   WEIGHT BEARING RESTRICTIONS: Yes nonweightbearing through right wrist and arm now and for the next 6 weeks   SUBJECTIVE:   SUBJECTIVE STATEMENT: She is 8 weeks s/p Rt DRF with conservative healing in cast.  She states she is still a bit fearful about hurting herself, and she has been withholding weight lifting but she would like to get back to it.     PAIN:  Are you having pain? Yes: NPRS scale: 6-7/10 now (she states "it is a little achy") Pain location: Right wrist Pain description: Aching and sore Aggravating factors: Motion and weightbearing Relieving factors: Rest  FALLS: Has patient fallen in last 6 months? Yes. Number of falls 2 at least, does have hx of dizziness   PATIENT GOALS: Improve use of right dominant hand and arm for daily  ability  NEXT MD VISIT: 08/05/2023   OBJECTIVE: (All objective assessments below are from initial evaluation on: 07/27/23 unless otherwise specified.)   HAND DOMINANCE: Right   ADLs: Overall ADLs: States decreased ability to grab, hold household objects, pain and difficulty to open containers, perform FMS tasks (manipulate fasteners on clothing), mild to moderate bathing problems as well.    FUNCTIONAL OUTCOME MEASURES: Eval: Patient Rated Wrist Evaluation (PRWE): Pain: 36/50; Function: 39.5/50; Total Score: 75.5/100 (Higher Score  =  More Pain and/or Debility)    UPPER EXTREMITY ROM     Shoulder to Wrist AROM Right eval Left eval Rt 08/12/23 Rt 08/18/23 Rt 08/24/23  Forearm supination 61  51 64   Forearm pronation  76  71    Wrist flexion 43 80 30 56 46  Wrist extension 48 65 45 50 48  Wrist ulnar deviation 30  31    Wrist radial deviation 20  20    (Blank rows = not tested)   Hand AROM Right eval  Full Fist Ability (or Gap to Distal Palmar Crease) LOOSE FULL FIST   Thumb Opposition  (Kapandji Scale)  8/10  (Blank rows = not tested)   UPPER EXTREMITY MMT:     MMT Right 08/24/23  Shoulder flexion   Shoulder abduction   Shoulder adduction   Shoulder extension   Shoulder internal rotation   Shoulder external rotation   Middle trapezius   Lower trapezius   Elbow flexion   Elbow extension   Forearm supination 4-/5  Forearm pronation 4+/5  Wrist flexion 4-/5  Wrist extension 4-/5  Wrist ulnar deviation   Wrist radial deviation   (Blank rows = not tested)  HAND FUNCTION: 08/24/23: Grip strength Right: 24 lbs, Left: 45 lbs   COORDINATION: 08/24/23: 9 Hole Peg Test Right: 24sec,  (26 sec is WFL)    EDEMA:   Eval:  Mildly swollen in right wrist and arm today  OBSERVATIONS:   Eval: Wrist is a bit stiff, swollen, largely nontender.  Hand and thumb seem to be working fairly well today, and she is in very good physical condition otherwise.  Dizziness and falling is a  concern and she was encouraged to get back into physical therapy to help with this.   TODAY'S TREATMENT:  08/24/23: New range of motion measures show some stiffness in the wrist, possibly from fear and apprehension as she states she is nervous about reinjuring herself.  OT encourages her to warm up her arm and keep stretching it multiple times a day prevent the stiffness and also encourages her that she likely will not reinjure her arm this far out from her injury.  She should avoid ballistic motions, heavy weight lifting, etc.  Otherwise she can start to do light weight and resistance which was done with her today in the session.  While she is on moist heat for 3 minutes, OT upgrades  her exercises to include strengthening as listed below, then OT reviews her stretches with her and she performs them, then we carefully perform these new dynamic strengthening activities to stabilize the wrist joint.  She does well with all of them with between 1 and 3 pounds.  She was asked to use a red therapy band or between 1 and 3 pounds to achieve 1-2 sets of 10-15 reps of each of these.  Additionally, she was given a compressive wrist brace that she can wear and lieu of her prefabricated wrist cock up brace now.  If it well, she feels supported by it.  She should still wear her larger brace if she is having any new pain or problems or cannot sleep without it.  She states understanding and leaves in no significant pain today.  Exercises - Forearm Supination Stretch  - 3-4 x daily - 3-5 reps - 15 sec hold - Forearm Pronation Stretch  - 3-4 x daily - 3-5 reps - 15 sec hold - Wrist Flexion Stretch  - 4 x daily - 3-5 reps - 15 sec hold - Wrist Prayer Stretch  - 4 x daily - 3-5 reps - 15 sec hold - Standing Bicep Curls with Resistance  - 2-3 x daily - 4-5 x weekly - 1-2 sets - 10-15 reps - Tricep Kick Back with Resistance  - 2-4 x daily - 1-2 sets - 10-15 reps - Hammer Stretch or Strength   - 2-4 x daily - 1-2 sets - 10-15  reps - Wrist Extension with Resistance  - 2-4 x daily - 1-2 sets - 10-15 reps - Wrist Flexion with Resistance  - 2-4 x daily - 1-2 sets - 10-15 reps  PATIENT EDUCATION: Education details: See tx section above for details  Person educated: Patient Education method: Engineer, structural, Teach back, Handouts  Education comprehension: States and demonstrates understanding, Additional Education required    HOME EXERCISE PROGRAM: Access Code: [email or web address] URL: [email or web address] Date: 07/27/2023 Prepared by: Leartis Proud    GOALS: Goals reviewed with patient? Yes   SHORT TERM GOALS: (STG required if POC>30 days) Target Date: 08/14/2023  Pt will obtain protective, custom orthotic. Goal status: TBD/PRN  2.  Pt will demo/state understanding of initial HEP to improve pain levels and prerequisite motion. Goal status:08/24/23: MET  LONG TERM GOALS: Target Date: 09/11/23  Pt will improve functional ability by decreased impairment per PRWE assessment from 75.5 to 30 or better, for better quality of life. Goal status: INITIAL  2.  Pt will improve grip strength in right dominant hand from unsafe to test lbs to at least 25 lbs for functional use at home and in IADLs. Goal status: INITIAL  3.  Pt will improve A/ROM in right wrist flexion/extension from 43/48 respectively to at least 60 degrees each, to have functional motion for tasks like reach and grasp.  Goal status: INITIAL  4.  Pt will improve strength in right wrist flexion/extension from apparent 3 -/5 MMT to at least 4+/5 MMT to have increased functional ability to carry out selfcare and higher-level homecare tasks with less difficulty. Goal status: INITIAL  5.  Pt will improve coordination skills in right hand and arm, as seen by within functional limit score on nine-hole peg testing to have increased functional ability to carry out fine motor tasks (fasteners, etc.) and more complex, coordinated IADLs (meal prep,  sports, etc.).  Goal status: INITIAL  6.  Pt will decrease pain at worst from 7/10 to 2/10 or better  to have better sleep and occupational participation in daily roles. Goal status: INITIAL   ASSESSMENT:  CLINICAL IMPRESSION: 08/24/23: Doing well and now tolerating strengthening lately.  She just needs to be cautious for another 2-3 more weeks, continue to strengthen.  We may only need 1-2 more therapy visits  if all is going well  08/18/23: Today she tolerated light strengthening well and had less pain and soreness after moist heat, manual therapy and exercises.  Will continue on carefully    PLAN:  OT FREQUENCY: 1-2x/week  OT DURATION: 6 weeks through 09/11/2023 and up to 10 total visits as needed  PLANNED INTERVENTIONS: 97168 OT Re-evaluation, 97535 self care/ADL training, 60454 therapeutic exercise, 97530 therapeutic activity, 97112 neuromuscular re-education, 97140 manual therapy, 97035 ultrasound, 97039 fluidotherapy, 97010 moist heat, 97010 cryotherapy, 97760 Orthotic Initial, 97763 Orthotic/Prosthetic subsequent, passive range of motion, Dry needling, energy conservation, coping strategies training, and patient/family education  RECOMMENDED OTHER SERVICES: Recommended to return to physical therapy to work on dizziness and possibly vertigo issues, as she keeps falling and remains a fall risk  CONSULTED AND AGREED WITH PLAN OF CARE: Patient  PLAN FOR NEXT SESSION:   Check new strengthening activities and advance as tolerated   Leartis Proud, OTR/L, CHT 08/24/2023, 1:54 PM

## 2023-08-21 NOTE — Progress Notes (Signed)
    Subjective:    Patient ID: Linda [name], female    DOB: 03-10-1951, 73 y.o.   MRN: [id number]      HPI Loryn is here for  Chief Complaint  Patient presents with   Toe Pain    Follow up for toe pain    She was here 3 days ago.  Finishes prednisone  today but is still taking the antibiotic.  She was concerned if the toe/foot looked worse or not.  She does have some pain and tingling/burning sensation in between her 4th and 5th toes.  She continues to apply anti-itch cream and antibacterial cream    Medications and allergies reviewed with patient and updated if appropriate.  Current Outpatient Medications on File Prior to Visit  Medication Sig Dispense Refill   alendronate  (FOSAMAX ) 70 MG tablet Take 70 mg by mouth once a week.     Ascorbic Acid (VITAMIN C) 1000 MG tablet Take 1,000 mg by mouth daily.     Calcium Carb-Cholecalciferol (CALCIUM 1000 + D PO) Take by mouth daily.     cephALEXin  (KEFLEX ) 500 MG capsule Take 1 capsule (500 mg total) by mouth 3 (three) times daily for 5 days. 15 capsule 0   cholecalciferol (VITAMIN D ) 1000 units tablet Take 1.5 tablets (1,500 Units total) by mouth daily. 90 tablet 2   fluticasone  (FLONASE ) 50 MCG/ACT nasal spray Place into the nose.     predniSONE  (DELTASONE ) 20 MG tablet Take 1 tablet (20 mg total) by mouth daily with breakfast for 3 days. 3 tablet 0   famotidine  (PEPCID ) 40 MG tablet Take 40 mg by mouth 2 (two) times daily. (Patient not taking: Reported on 05/06/2023)     No current facility-administered medications on file prior to visit.    Review of Systems     Objective:   Vitals:   08/21/23 1108  BP: 120/72  Pulse: 80  Temp: 98 F (36.7 C)  SpO2: 96%   BP Readings from Last 3 Encounters:  08/21/23 120/72  08/18/23 130/82  05/26/23 130/80   Wt Readings from Last 3 Encounters:  08/18/23 127 lb (57.6 kg)  05/26/23 128 lb (58.1 kg)  04/28/23 130 lb (59 kg)   Body mass index is 21.8 kg/m.    Physical  Exam Constitutional:      General: She is not in acute distress.    Appearance: Normal appearance. She is not ill-appearing.  HENT:     Head: Normocephalic and atraumatic.  Skin:    General: Skin is warm and dry.     Comments: Minimal erythema dorsal aspect of distal foot 3rd-5th toes. Between 4-5th toes ulcerated - from skin peeling - no bleeding, no discharge  Neurological:     Mental Status: She is alert.            Assessment & Plan:    See Problem List for Assessment and Plan of chronic medical problems.

## 2023-08-21 NOTE — Assessment & Plan Note (Signed)
 Acute I do think there is improvement since I saw her 3 days ago.  The erythema on the dorsal aspect of the foot is less The area between the 4th and 5th toes is ulcerated from her skin peeling likely secondary to be allergic reaction, but there is no evidence of infection Finishes prednisone  today Complete antibiotic She will monitor the area closely after completing the antibiotic and monitor for increasing redness, increasing pain or discharge Continue topical medications Discussed that if needed we can extend the antibiotic, but at this point I do not think that is necessary

## 2023-08-24 ENCOUNTER — Encounter: Payer: Self-pay | Admitting: Rehabilitative and Restorative Service Providers"

## 2023-08-24 ENCOUNTER — Ambulatory Visit: Admitting: Rehabilitative and Restorative Service Providers"

## 2023-08-24 DIAGNOSIS — M25531 Pain in right wrist: Secondary | ICD-10-CM

## 2023-08-24 DIAGNOSIS — M25631 Stiffness of right wrist, not elsewhere classified: Secondary | ICD-10-CM

## 2023-08-24 DIAGNOSIS — M6281 Muscle weakness (generalized): Secondary | ICD-10-CM

## 2023-08-24 DIAGNOSIS — R600 Localized edema: Secondary | ICD-10-CM | POA: Diagnosis not present

## 2023-08-24 DIAGNOSIS — R278 Other lack of coordination: Secondary | ICD-10-CM

## 2023-08-25 ENCOUNTER — Ambulatory Visit (INDEPENDENT_AMBULATORY_CARE_PROVIDER_SITE_OTHER): Payer: Medicare HMO

## 2023-08-25 VITALS — Ht 64.0 in | Wt 124.0 lb

## 2023-08-25 DIAGNOSIS — Z0000 Encounter for general adult medical examination without abnormal findings: Secondary | ICD-10-CM

## 2023-08-25 NOTE — Patient Instructions (Signed)
 Linda [name] , Thank you for taking time out of your busy schedule to complete your Annual Wellness Visit with me. I enjoyed our conversation and look forward to speaking with you again next year. I, as well as your care team,  appreciate your ongoing commitment to your health goals. Please review the following plan we discussed and let me know if I can assist you in the future. Your Game plan/ To Do List    Referrals: If you haven't heard from the office you've been referred to, please reach out to them at the phone provided.  Patient stated she plans to contact [facility] to schedule Mammogram and DEXA (bone) scan for 2025. Follow up Visits: Next Medicare AWV with our clinical staff: 08/29/2024   Have you seen your provider in the last 6 months (3 months if uncontrolled diabetes)? Yes Next Office Visit with your provider: patient to schedule a physical  Clinician Recommendations:  Aim for 30 minutes of exercise or brisk walking, 6-8 glasses of water, and 5 servings of fruits and vegetables each day. Educated and advised on getting the Shingles vaccines at local pharmacy in 2025.      This is a list of the screening recommended for you and due dates:  Health Maintenance  Topic Date Due   Zoster (Shingles) Vaccine (1 of 2) 08/09/2000   Mammogram  08/19/2020   COVID-19 Vaccine (7 - 2024-25 season) 06/01/2023   DEXA scan (bone density measurement)  08/09/2023   Flu Shot  10/23/2023   Medicare Annual Wellness Visit  08/24/2024   Cologuard (Stool DNA test)  09/01/2025   DTaP/Tdap/Td vaccine (3 - Td or Tdap) 10/02/2025   Pneumonia Vaccine  Completed   Hepatitis C Screening  Completed   HPV Vaccine  Aged Out   Meningitis B Vaccine  Aged Out   Colon Cancer Screening  Discontinued    Advanced directives: (Declined) Advance directive discussed with you today. Even though you declined this today, please call our office should you change your mind, and we can give you the proper paperwork for you  to fill out. Advance Care Planning is important because it:  [x]  Makes sure you receive the medical care that is consistent with your values, goals, and preferences  [x]  It provides guidance to your family and loved ones and reduces their decisional burden about whether or not they are making the right decisions based on your wishes.  Follow the link provided in your after visit summary or read over the paperwork we have mailed to you to help you started getting your Advance Directives in place. If you need assistance in completing these, please reach out to us  so that we can help you!

## 2023-08-25 NOTE — Progress Notes (Signed)
 Subjective:   Linda [name] is a 73 y.o. who presents for a Medicare Wellness preventive visit.  As a reminder, Annual Wellness Visits don't include a physical exam, and some assessments may be limited, especially if this visit is performed virtually. We may recommend an in-person follow-up visit with your provider if needed.  Visit Complete: Virtual I connected with  Connee Deforest on 08/25/23 by a audio enabled telemedicine application and verified that I am speaking with the correct person using two identifiers.  Patient Location: Home  Provider Location: Office/Clinic  I discussed the limitations of evaluation and management by telemedicine. The patient expressed understanding and agreed to proceed.  Vital Signs: Because this visit was a virtual/telehealth visit, some criteria may be missing or patient reported. Any vitals not documented were not able to be obtained and vitals that have been documented are patient reported.  VideoDeclined- This patient declined Librarian, academic. Therefore the visit was completed with audio only.  Persons Participating in Visit: Patient.  AWV Questionnaire: No: Patient Medicare AWV questionnaire was not completed prior to this visit.  Cardiac Risk Factors include: advanced age (>56men, >49 women);hypertension     Objective:     Today's Vitals   08/25/23 1513  Weight: 124 lb (56.2 kg)  Height: 5\' 4"  (1.626 m)   Body mass index is 21.28 kg/m.     08/25/2023    3:12 PM 07/27/2023    8:56 AM 05/06/2023   11:11 AM 09/17/2022    9:10 PM 08/21/2022    3:19 PM 09/11/2021    3:45 PM 09/10/2020    9:05 AM  Advanced Directives  Does Patient Have a Medical Advance Directive? No No No No Yes No   Type of Agricultural consultant;Living will    Copy of Healthcare Power of Attorney in Chart?     No - copy requested    Would patient like information on creating a medical advance directive? No -  Patient declined No - Patient declined No - Patient declined No - Patient declined  No - Patient declined No - Patient declined    Current Medications (verified) Outpatient Encounter Medications as of 08/25/2023  Medication Sig   Ascorbic Acid (VITAMIN C) 1000 MG tablet Take 1,000 mg by mouth daily.   Calcium Carb-Cholecalciferol (CALCIUM 1000 + D PO) Take by mouth daily.   cholecalciferol (VITAMIN D ) 1000 units tablet Take 1.5 tablets (1,500 Units total) by mouth daily.   famotidine  (PEPCID ) 40 MG tablet Take 40 mg by mouth 2 (two) times daily.   fluticasone  (FLONASE ) 50 MCG/ACT nasal spray Place into the nose.   alendronate  (FOSAMAX ) 70 MG tablet Take 70 mg by mouth once a week. (Patient not taking: Reported on 08/25/2023)   No facility-administered encounter medications on file as of 08/25/2023.    Allergies (verified) Fosamax  [alendronate ]   History: Past Medical History:  Diagnosis Date   Cellulitis of leg, right 08/27/2016   Hyperlipidemia    Osteopenia    Pneumonia    Vitamin D  deficiency    Past Surgical History:  Procedure Laterality Date   COLONOSCOPY  03/2011   Dr Brodie;diminuitive polyp   G 2 P 2     TONSILLECTOMY     Family History  Problem Relation Age of Onset   Melanoma Father    Hyperlipidemia Mother    Heart disease Mother        no MI  Diabetes Paternal Grandmother        vision loss   Diabetes Maternal Uncle    Colon cancer Neg Hx    Stomach cancer Neg Hx    Esophageal cancer Neg Hx    Rectal cancer Neg Hx    Stroke Neg Hx    Social History   Socioeconomic History   Marital status: Married    Spouse name: Not on file   Number of children: 2   Years of education: Not on file   Highest education level: Not on file  Occupational History   Occupation: banking  Tobacco Use   Smoking status: Never   Smokeless tobacco: Never  Vaping Use   Vaping status: Never Used  Substance and Sexual Activity   Alcohol use: Yes    Alcohol/week: 1.0  standard drink of alcohol    Types: 1 Glasses of wine per week    Comment:  1-2 wine or beer nightly   Drug use: No   Sexual activity: Yes  Other Topics Concern   Not on file  Social History Narrative   Exercise:  Yoga, weights, walking      Two children, grandchildren   Social Drivers of Corporate investment banker Strain: Low Risk  (08/25/2023)   Overall Financial Resource Strain (CARDIA)    Difficulty of Paying Living Expenses: Not hard at all  Food Insecurity: No Food Insecurity (08/25/2023)   Hunger Vital Sign    Worried About Running Out of Food in the Last Year: Never true    Ran Out of Food in the Last Year: Never true  Transportation Needs: No Transportation Needs (08/25/2023)   PRAPARE - Administrator, Civil Service (Medical): No    Lack of Transportation (Non-Medical): No  Physical Activity: Insufficiently Active (08/25/2023)   Exercise Vital Sign    Days of Exercise per Week: 4 days    Minutes of Exercise per Session: 30 min  Stress: Stress Concern Present (08/25/2023)   [employer] of Occupational Health - Occupational Stress Questionnaire    Feeling of Stress : To some extent  Social Connections: Socially Integrated (08/25/2023)   Social Connection and Isolation Panel [NHANES]    Frequency of Communication with Friends and Family: More than three times a week    Frequency of Social Gatherings with Friends and Family: More than three times a week    Attends Religious Services: More than 4 times per year    Active Member of [employer] or Organizations: No    Attends Engineer, structural: More than 4 times per year    Marital Status: Married    Tobacco Counseling Counseling given: No    Clinical Intake:  Pre-visit preparation completed: Yes  Pain : No/denies pain     BMI - recorded: 21.28 Nutritional Risks: None Diabetes: No  Lab Results  Component Value Date   HGBA1C 5.5 08/25/2022   HGBA1C 5.7 08/15/2021   HGBA1C 5.5 11/02/2019      How often do you need to have someone help you when you read instructions, pamphlets, or other written materials from your doctor or pharmacy?: 1 - Never  Interpreter Needed?: No  Information entered by :: Kandy Orris, CMA   Activities of Daily Living     08/25/2023    3:15 PM  In your present state of health, do you have any difficulty performing the following activities:  Hearing? 0  Vision? 0  Difficulty concentrating or making decisions? 0  Walking  or climbing stairs? 0  Dressing or bathing? 0  Doing errands, shopping? 0  Preparing Food and eating ? N  Using the Toilet? N  In the past six months, have you accidently leaked urine? Y  Comment wears a pantyliner  Do you have problems with loss of bowel control? N  Managing your Medications? N  Managing your Finances? N  Housekeeping or managing your Housekeeping? N    Patient Care Team: Colene Dauphin, MD as PCP - General (Internal Medicine) Maris Sickle, MD as Referring Physician (Ophthalmology)  I have updated your Care Teams any recent Medical Services you may have received from other providers in the past year.     Assessment:    This is a routine wellness examination for [place].  Hearing/Vision screen Hearing Screening - Comments:: Denies hearing difficulties   Vision Screening - Comments:: Wears rx glasses - up to date with routine eye exams with Dr Maris Sickle   Goals Addressed               This Visit's Progress     Patient Stated (pt-stated)        Patient stated she fractured her wrist ago and sprain ankle but plans to heal and keep doing PT       Depression Screen     08/25/2023    3:17 PM 08/25/2022    8:13 AM 08/21/2022    3:20 PM 09/11/2021    3:38 PM 08/20/2021    8:21 AM 09/10/2020    9:21 AM 08/14/2020   12:36 PM  PHQ 2/9 Scores  PHQ - 2 Score 0 0 0 0 0 0 1  PHQ- 9 Score 1 0   1  5    Fall Risk     08/25/2023    3:16 PM 08/25/2022    8:13 AM 08/21/2022    3:20 PM 08/20/2021     8:21 AM 09/10/2020    9:06 AM  Fall Risk   Falls in the past year? 1 0 0 0 0  Number falls in past yr: 1 0 0 0 0  Comment 2      Injury with Fall? 1 0 0 0 0  Comment fractured wrists; sprain ankle      Risk for fall due to :  No Fall Risks No Fall Risks No Fall Risks No Fall Risks  Follow up Falls evaluation completed;Falls prevention discussed Falls evaluation completed Falls prevention discussed;Education provided;Falls evaluation completed Falls evaluation completed Falls evaluation completed    MEDICARE RISK AT HOME:  Medicare Risk at Home Any stairs in or around the home?: Yes If so, are there any without handrails?: No Home free of loose throw rugs in walkways, pet beds, electrical cords, etc?: Yes Adequate lighting in your home to reduce risk of falls?: Yes Life alert?: No Use of a cane, walker or w/c?: No Grab bars in the bathroom?: No Shower chair or bench in shower?: No Elevated toilet seat or a handicapped toilet?: No  TIMED UP AND GO:  Was the test performed?  No  Cognitive Function: 6CIT completed        08/25/2023    3:20 PM 08/21/2022    3:20 PM 09/11/2021    3:47 PM 08/03/2019   10:00 AM  6CIT Screen  What Year? 0 points 0 points 0 points 0 points  What month? 0 points 0 points 0 points 0 points  What time? 0 points 0 points 0 points 0 points  Count back from 20 0 points 0 points 0 points 0 points  Months in reverse 0 points 2 points 0 points 0 points  Repeat phrase 0 points 2 points 0 points 0 points  Total Score 0 points 4 points 0 points 0 points    Immunizations Immunization History  Administered Date(s) Administered   Fluad Quad(high Dose 65+) 11/18/2018   Influenza, High Dose Seasonal PF 04/30/2016, 12/06/2016, 12/15/2017, 12/29/2019   Influenza,inj,Quad PF,6+ Mos 01/19/2015   Influenza-Unspecified 12/22/2013, 12/06/2016, 11/17/2019   PFIZER(Purple Top)SARS-COV-2 Vaccination 04/12/2019, 05/02/2019, 12/29/2019, 12/14/2020   Pfizer(Comirnaty)Fall  Seasonal Vaccine 12 years and older 12/27/2021   Pneumococcal Conjugate-13 10/17/2015   Pneumococcal Polysaccharide-23 01/27/2017   Td 11/22/2004   Tdap 10/03/2015   Unspecified SARS-COV-2 Vaccination 12/02/2022   Zoster, Live 10/03/2015    Screening Tests Health Maintenance  Topic Date Due   Zoster Vaccines- Shingrix (1 of 2) 08/09/2000   MAMMOGRAM  08/19/2020   COVID-19 Vaccine (7 - 2024-25 season) 06/01/2023   DEXA SCAN  08/09/2023   INFLUENZA VACCINE  10/23/2023   Medicare Annual Wellness (AWV)  08/24/2024   Fecal DNA (Cologuard)  09/01/2025   DTaP/Tdap/Td (3 - Td or Tdap) 10/02/2025   Pneumonia Vaccine 9+ Years old  Completed   Hepatitis C Screening  Completed   HPV VACCINES  Aged Out   Meningococcal B Vaccine  Aged Out   Colonoscopy  Discontinued    Health Maintenance  Health Maintenance Due  Topic Date Due   Zoster Vaccines- Shingrix (1 of 2) 08/09/2000   MAMMOGRAM  08/19/2020   COVID-19 Vaccine (7 - 2024-25 season) 06/01/2023   DEXA SCAN  08/09/2023   Health Maintenance Items Addressed:  Pt stated will contact [facility] to schedule DEXA and Mammogram tests for 2025.  Additional Screening:  Vision Screening: Recommended annual ophthalmology exams for early detection of glaucoma and other disorders of the eye.    Dental Screening: Recommended annual dental exams for proper oral hygiene  Community Resource Referral / Chronic Care Management: CRR required this visit?  No   CCM required this visit?  No   Plan:    I have personally reviewed and noted the following in the patient's chart:   Medical and social history Use of alcohol, tobacco or illicit drugs  Current medications and supplements including opioid prescriptions. Patient is not currently taking opioid prescriptions. Functional ability and status Nutritional status Physical activity Advanced directives List of other physicians Hospitalizations, surgeries, and ER visits in previous 12  months Vitals Screenings to include cognitive, depression, and falls Referrals and appointments  In addition, I have reviewed and discussed with patient certain preventive protocols, quality metrics, and best practice recommendations. A written personalized care plan for preventive services as well as general preventive health recommendations were provided to patient.   Patria Bookbinder, CMA   08/25/2023   After Visit Summary: (MyChart) Due to this being a telephonic visit, the after visit summary with patients personalized plan was offered to patient via MyChart   Notes: Nothing significant to report at this time.

## 2023-09-01 NOTE — Therapy (Signed)
 OUTPATIENT OCCUPATIONAL THERAPY TREATMENT NOTE   Patient Name: Linda [name] MRN: [id number] DOB:Nov 27, 1950, 73 y.o., female Today's Date: 09/01/2023  PCP: Oma Bias, MD REFERRING PROVIDER: Casilda Clayman, PA-C   END OF SESSION:       Past Medical History:  Diagnosis Date   Cellulitis of leg, right 08/27/2016   Hyperlipidemia    Osteopenia    Pneumonia    Vitamin D  deficiency    Past Surgical History:  Procedure Laterality Date   COLONOSCOPY  03/2011   Dr Brodie;diminuitive polyp   G 2 P 2     TONSILLECTOMY     Patient Active Problem List   Diagnosis Date Noted   Allergic reaction 08/18/2023   Cellulitis of left foot 08/18/2023   Macular degeneration 08/25/2022   Pain with swallowing 07/30/2022   Deficiency anemia 04/03/2021   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 04/03/2021   Subacute cough 04/02/2021   Abnormal finding on CT scan 09/04/2020   Aneurysm artery, 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   PMR (polymyalgia rheumatica) (HCC) 02/23/2020   Dizziness 10/26/2018   Loss of transverse plantar arch 10/19/2018   Neuroma of foot 10/19/2018   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  Rationale for Evaluation and Treatment: Rehabilitation  PERTINENT HISTORY: "To start in 1 week-s/p right wrist fx-to work on PROM and AROM *NO LIFTING OR STRENGTHENING YET*" She states having some pain and problems to the broken right wrist, some swelling, etc.  She normally stays very active, doing Zumba, has been trying to avoid these things.  She does state falling yesterday and twisting  her ankle, and does have a recent history of dizziness and falls.  She attended 1 physical therapy visit for this and then stopped attending because she "did not like it."  She was encouraged to get back into physical therapy to work on this problem.  She arrives wearing a prefabricated wrist cock up brace today.  She states that it fits her fairly well  PRECAUTIONS: Fall (OT will use close proximity, has recommended return to physical therapy to work on this risk of falls mixed with dizziness)  RED FLAGS: None   WEIGHT BEARING RESTRICTIONS: Yes nonweightbearing through right wrist and arm now and for the next 6 weeks   SUBJECTIVE:   SUBJECTIVE STATEMENT: She is 9 weeks s/p Rt DRF with conservative healing in cast.   She states ***    she is still a bit fearful about hurting herself, and she has been withholding weight lifting but she would like to get back to it.     PAIN:  Are you having pain? Yes: NPRS scale: *** 6-7/10 now (she states "it is a little achy") Pain location: Right wrist Pain description: Aching and sore Aggravating factors: Motion and weightbearing Relieving factors: Rest  FALLS: Has patient fallen in last 6 months? Yes. Number of falls 2 at least, does have hx of dizziness   PATIENT GOALS: Improve use of right dominant hand and arm for daily ability  NEXT MD VISIT: 08/05/2023   OBJECTIVE: (All objective assessments below are from initial evaluation on: 07/27/23  unless otherwise specified.)   HAND DOMINANCE: Right   ADLs: Overall ADLs: States decreased ability to grab, hold household objects, pain and difficulty to open containers, perform FMS tasks (manipulate fasteners on clothing), mild to moderate bathing problems as well.    FUNCTIONAL OUTCOME MEASURES: Eval: Patient Rated Wrist Evaluation (PRWE): Pain: 36/50; Function: 39.5/50; Total Score: 75.5/100 (Higher Score  =  More Pain and/or Debility)    UPPER EXTREMITY ROM     Shoulder to Wrist AROM  Right eval Left eval Rt 08/12/23 Rt 08/18/23 Rt 08/24/23 Rt 09/02/23  Forearm supination 61  51 64  ***  Forearm pronation  76  71   ***  Wrist flexion 43 80 30 56 46 ***  Wrist extension 48 65 45 50 48 ***  Wrist ulnar deviation 30  31     Wrist radial deviation 20  20     (Blank rows = not tested)   Hand AROM Right eval Rt 09/02/23  Full Fist Ability (or Gap to Distal Palmar Crease) LOOSE FULL FIST  ***  Thumb Opposition  (Kapandji Scale)  8/10   (Blank rows = not tested)   UPPER EXTREMITY MMT:     MMT Right 08/24/23 Rt 09/02/23  Shoulder flexion    Shoulder abduction    Shoulder adduction    Shoulder extension    Shoulder internal rotation    Shoulder external rotation    Middle trapezius    Lower trapezius    Elbow flexion    Elbow extension    Forearm supination 4-/5 ***/5  Forearm pronation 4+/5 ***/5  Wrist flexion 4-/5 ***/5  Wrist extension 4-/5 ***/5  Wrist ulnar deviation    Wrist radial deviation    (Blank rows = not tested)  HAND FUNCTION: 09/02/23: Grip Rt: ***#   08/24/23: Grip strength Right: 24 lbs, Left: 45 lbs   COORDINATION: 08/24/23: 9 Hole Peg Test Right: 24sec,  (26 sec is WFL)    EDEMA:   Eval:  Mildly swollen in right wrist and arm today  OBSERVATIONS:   Eval: Wrist is a bit stiff, swollen, largely nontender.  Hand and thumb seem to be working fairly well today, and she is in very good physical condition otherwise.  Dizziness and falling is a concern and she was encouraged to get back into physical therapy to help with this.   TODAY'S TREATMENT:  09/02/23: *** Check new strengthening activities and advance as tolerated   08/24/23: New range of motion measures show some stiffness in the wrist, possibly from fear and apprehension as she states she is nervous about reinjuring herself.  OT encourages her to warm up her arm and keep stretching it multiple times a day prevent the stiffness and also encourages her that she likely will not reinjure  her arm this far out from her injury.  She should avoid ballistic motions, heavy weight lifting, etc.  Otherwise she can start to do light weight and resistance which was done with her today in the session.  While she is on moist heat for 3 minutes, OT upgrades her exercises to include strengthening as listed below, then OT reviews her stretches with her and she performs them, then we carefully perform these new dynamic strengthening activities to stabilize the wrist joint.  She does well with all of them with between 1 and 3 pounds.  She was asked to use a red therapy band or between 1 and 3 pounds to achieve 1-2 sets of 10-15 reps of each  of these.  Additionally, she was given a compressive wrist brace that she can wear and lieu of her prefabricated wrist cock up brace now.  If it well, she feels supported by it.  She should still wear her larger brace if she is having any new pain or problems or cannot sleep without it.  She states understanding and leaves in no significant pain today.  Exercises - Forearm Supination Stretch  - 3-4 x daily - 3-5 reps - 15 sec hold - Forearm Pronation Stretch  - 3-4 x daily - 3-5 reps - 15 sec hold - Wrist Flexion Stretch  - 4 x daily - 3-5 reps - 15 sec hold - Wrist Prayer Stretch  - 4 x daily - 3-5 reps - 15 sec hold - Standing Bicep Curls with Resistance  - 2-3 x daily - 4-5 x weekly - 1-2 sets - 10-15 reps - Tricep Kick Back with Resistance  - 2-4 x daily - 1-2 sets - 10-15 reps - Hammer Stretch or Strength   - 2-4 x daily - 1-2 sets - 10-15 reps - Wrist Extension with Resistance  - 2-4 x daily - 1-2 sets - 10-15 reps - Wrist Flexion with Resistance  - 2-4 x daily - 1-2 sets - 10-15 reps  PATIENT EDUCATION: Education details: See tx section above for details  Person educated: Patient Education method: Engineer, structural, Teach back, Handouts  Education comprehension: States and demonstrates understanding, Additional Education required    HOME EXERCISE  PROGRAM: Access Code: [email or web address] URL: [email or web address] Date: 07/27/2023 Prepared by: Leartis Proud    GOALS: Goals reviewed with patient? Yes   SHORT TERM GOALS: (STG required if POC>30 days) Target Date: 08/14/2023  Pt will obtain protective, custom orthotic. Goal status: TBD/PRN  2.  Pt will demo/state understanding of initial HEP to improve pain levels and prerequisite motion. Goal status:08/24/23: MET  LONG TERM GOALS: Target Date: 09/11/23  Pt will improve functional ability by decreased impairment per PRWE assessment from 75.5 to 30 or better, for better quality of life. Goal status: INITIAL  2.  Pt will improve grip strength in right dominant hand from unsafe to test lbs to at least 25 lbs for functional use at home and in IADLs. Goal status: INITIAL  3.  Pt will improve A/ROM in right wrist flexion/extension from 43/48 respectively to at least 60 degrees each, to have functional motion for tasks like reach and grasp.  Goal status: INITIAL  4.  Pt will improve strength in right wrist flexion/extension from apparent 3 -/5 MMT to at least 4+/5 MMT to have increased functional ability to carry out selfcare and higher-level homecare tasks with less difficulty. Goal status: INITIAL  5.  Pt will improve coordination skills in right hand and arm, as seen by within functional limit score on nine-hole peg testing to have increased functional ability to carry out fine motor tasks (fasteners, etc.) and more complex, coordinated IADLs (meal prep, sports, etc.).  Goal status: INITIAL  6.  Pt will decrease pain at worst from 7/10 to 2/10 or better to have better sleep and occupational participation in daily roles. Goal status: INITIAL   ASSESSMENT:  CLINICAL IMPRESSION: 09/02/23: ***  08/24/23: Doing well and now tolerating strengthening lately.  She just needs to be cautious for another 2-3 more weeks, continue to strengthen.  We may only need 1-2 more therapy  visits  if all is going well  08/18/23: Today she tolerated light strengthening well and had less pain  and soreness after moist heat, manual therapy and exercises.  Will continue on carefully    PLAN:  OT FREQUENCY: 1-2x/week  OT DURATION: 6 weeks through 09/11/2023 and up to 10 total visits as needed  PLANNED INTERVENTIONS: 97168 OT Re-evaluation, 97535 self care/ADL training, 16109 therapeutic exercise, 97530 therapeutic activity, 97112 neuromuscular re-education, 97140 manual therapy, 97035 ultrasound, 97039 fluidotherapy, 97010 moist heat, 97010 cryotherapy, 97760 Orthotic Initial, 97763 Orthotic/Prosthetic subsequent, passive range of motion, Dry needling, energy conservation, coping strategies training, and patient/family education  RECOMMENDED OTHER SERVICES: Recommended to return to physical therapy to work on dizziness and possibly vertigo issues, as she keeps falling and remains a fall risk  CONSULTED AND AGREED WITH PLAN OF CARE: Patient  PLAN FOR NEXT SESSION:   ***   Leartis Proud, OTR/L, CHT 09/01/2023, 5:15 PM

## 2023-09-02 ENCOUNTER — Encounter: Payer: Self-pay | Admitting: Rehabilitative and Restorative Service Providers"

## 2023-09-02 ENCOUNTER — Ambulatory Visit: Admitting: Rehabilitative and Restorative Service Providers"

## 2023-09-02 DIAGNOSIS — M25631 Stiffness of right wrist, not elsewhere classified: Secondary | ICD-10-CM | POA: Diagnosis not present

## 2023-09-02 DIAGNOSIS — M25531 Pain in right wrist: Secondary | ICD-10-CM

## 2023-09-02 DIAGNOSIS — R278 Other lack of coordination: Secondary | ICD-10-CM

## 2023-09-02 DIAGNOSIS — M6281 Muscle weakness (generalized): Secondary | ICD-10-CM | POA: Diagnosis not present

## 2023-09-09 ENCOUNTER — Ambulatory Visit: Admitting: Orthopedic Surgery

## 2023-09-14 ENCOUNTER — Other Ambulatory Visit (INDEPENDENT_AMBULATORY_CARE_PROVIDER_SITE_OTHER): Payer: Self-pay

## 2023-09-14 ENCOUNTER — Ambulatory Visit: Admitting: Orthopedic Surgery

## 2023-09-14 ENCOUNTER — Encounter: Payer: Self-pay | Admitting: Orthopedic Surgery

## 2023-09-14 DIAGNOSIS — M25531 Pain in right wrist: Secondary | ICD-10-CM

## 2023-09-14 NOTE — Progress Notes (Signed)
 Post-Op Visit Note   Patient: Linda [name]           Date of Birth: 04/03/1950           MRN: [id number] Visit Date: 09/14/2023 PCP: Geofm Glade PARAS, MD   Assessment & Plan:  Chief Complaint:  Chief Complaint  Patient presents with   Right Wrist - Fracture, Follow-up    DOI: 07/26/23   Visit Diagnoses:  1. Pain in right wrist     Plan: Patient is now about 2 months out right wrist fracture which was nondisplaced.  She did complete physical therapy.  Has achiness at times in the right wrist.  She has been out of the brace.  On exam she has about 40 degrees of wrist dorsiflexion and 60 of palmar flexion.  Grip strength is slightly worse on the right compared to the left.  Only mild swelling around the fracture callus on the right-hand side.  Left ankle also has good stability with intact and symmetric stress to anterior drawer testing and varus tilt testing.  Plan at this time is continue with right wrist range of motion and strengthening exercises.  I do not want her to do a lot of loading on the wrist in terms of yoga planks with her full body weight.  I think after another month she could do more less whenever she would like.  For now she is going to continue to work on range of motion and strengthening.  Follow-up with us  as needed.  Follow-Up Instructions: No follow-ups on file.   Orders:  Orders Placed This Encounter  Procedures   XR Wrist Complete Right   No orders of the defined types were placed in this encounter.   Imaging: XR Wrist Complete Right Result Date: 09/14/2023 AP lateral oblique radiograph right wrist reviewed.  Distal radius fracture has good healing with callus formation noted.  No change in fracture alignment.  Scapholunate angle intact.   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   SI (sacroiliac) joint dysfunction 12/16/2017   Upper airway cough syndrome 11/17/2017   History of colonic polyps 05/22/2014   Prediabetes 09/04/2013   Allergic rhinitis 08/06/2013   Generalized anxiety disorder 11/29/2009   Vitamin D  deficiency 04/10/2008   Osteoporosis 10/04/2007   Past Medical History:  Diagnosis Date   Cellulitis of leg, right 08/27/2016   Hyperlipidemia    Osteopenia    Pneumonia    Vitamin D  deficiency     Family History  Problem Relation Age of Onset   Melanoma Father    Hyperlipidemia Mother    Heart disease Mother        no MI   Diabetes Paternal Grandmother        vision loss   Diabetes Maternal Uncle    Colon cancer Neg Hx    Stomach cancer Neg Hx    Esophageal cancer Neg Hx    Rectal cancer Neg Hx    Stroke Neg Hx  Past Surgical History:  Procedure Laterality Date   COLONOSCOPY  03/2011   Dr Brodie;diminuitive polyp   G 2 P 2     TONSILLECTOMY     Social History   Occupational History   Occupation: banking  Tobacco Use   Smoking status: Never   Smokeless tobacco: Never  Vaping Use   Vaping status: Never Used  Substance and Sexual Activity   Alcohol use: Yes    Alcohol/week: 1.0 standard drink of alcohol    Types: 1 Glasses of wine per week    Comment:  1-2 wine or beer nightly   Drug use: No   Sexual activity: Yes

## 2023-09-15 DIAGNOSIS — L738 Other specified follicular disorders: Secondary | ICD-10-CM | POA: Diagnosis not present

## 2023-09-15 DIAGNOSIS — D2262 Melanocytic nevi of left upper limb, including shoulder: Secondary | ICD-10-CM | POA: Diagnosis not present

## 2023-09-15 DIAGNOSIS — L821 Other seborrheic keratosis: Secondary | ICD-10-CM | POA: Diagnosis not present

## 2023-09-15 DIAGNOSIS — D2261 Melanocytic nevi of right upper limb, including shoulder: Secondary | ICD-10-CM | POA: Diagnosis not present

## 2023-09-15 DIAGNOSIS — D225 Melanocytic nevi of trunk: Secondary | ICD-10-CM | POA: Diagnosis not present

## 2023-09-16 DIAGNOSIS — H16213 Exposure keratoconjunctivitis, bilateral: Secondary | ICD-10-CM | POA: Diagnosis not present

## 2023-09-16 DIAGNOSIS — H04123 Dry eye syndrome of bilateral lacrimal glands: Secondary | ICD-10-CM | POA: Diagnosis not present

## 2023-10-02 ENCOUNTER — Ambulatory Visit (INDEPENDENT_AMBULATORY_CARE_PROVIDER_SITE_OTHER): Admitting: Internal Medicine

## 2023-10-02 ENCOUNTER — Encounter: Payer: Self-pay | Admitting: Internal Medicine

## 2023-10-02 VITALS — BP 134/80 | HR 78 | Temp 98.3°F | Ht 64.0 in | Wt 128.0 lb

## 2023-10-02 DIAGNOSIS — J029 Acute pharyngitis, unspecified: Secondary | ICD-10-CM

## 2023-10-02 DIAGNOSIS — R7303 Prediabetes: Secondary | ICD-10-CM

## 2023-10-02 DIAGNOSIS — E559 Vitamin D deficiency, unspecified: Secondary | ICD-10-CM | POA: Diagnosis not present

## 2023-10-02 DIAGNOSIS — K140 Glossitis: Secondary | ICD-10-CM

## 2023-10-02 MED ORDER — AMOXICILLIN-POT CLAVULANATE 875-125 MG PO TABS: 1.0000 | ORAL_TABLET | Freq: Two times a day (BID) | ORAL | 0 refills | Status: DC

## 2023-10-02 NOTE — Progress Notes (Unsigned)
 Patient ID: Karna CHRISTELLA Grieve, female   DOB: 14-Jan-1951, 73 y.o.   MRN: [id number]        Chief Complaint: follow up right tongue and throat pain x 3 days after dental procedure, preDM, lo vit d       HPI:  WANDY BOSSLER is a 73 y.o. female here with c/o unfortunate right tongue bite during a dental procedure when asked to bite down.  Had immediate pain, small bleeding and has ached since then but today also with worsening swelling and now pain to the throat as well, with pain on swallowing.  No fever, chills, drainage.  .Pt denies chest pain, increased sob or doe, wheezing, orthopnea, PND, increased LE swelling, palpitations, dizziness or syncope.  Pt is able to take po.         Wt Readings from Last 3 Encounters:  10/02/23 128 lb (58.1 kg)  08/25/23 124 lb (56.2 kg)  08/18/23 127 lb (57.6 kg)   BP Readings from Last 3 Encounters:  10/02/23 134/80  08/21/23 120/72  08/18/23 130/82         Past Medical History:  Diagnosis Date   Cellulitis of leg, right 08/27/2016   Hyperlipidemia    Osteopenia    Pneumonia    Vitamin D  deficiency    Past Surgical History:  Procedure Laterality Date   COLONOSCOPY  03/2011   Dr Brodie;diminuitive polyp   G 2 P 2     TONSILLECTOMY      reports that she has never smoked. She has never used smokeless tobacco. She reports current alcohol use of about 1.0 standard drink of alcohol per week. She reports that she does not use drugs. family history includes Diabetes in her maternal uncle and paternal grandmother; Heart disease in her mother; Hyperlipidemia in her mother; Melanoma in her father. Allergies  Allergen Reactions   Fosamax  [Alendronate ] Other (See Comments)    GERD   Current Outpatient Medications on File Prior to Visit  Medication Sig Dispense Refill   alendronate  (FOSAMAX ) 70 MG tablet Take 70 mg by mouth once a week.     Ascorbic Acid (VITAMIN C) 1000 MG tablet Take 1,000 mg by mouth daily.     Calcium Carb-Cholecalciferol (CALCIUM  1000 + D PO) Take by mouth daily.     cholecalciferol (VITAMIN D ) 1000 units tablet Take 1.5 tablets (1,500 Units total) by mouth daily. 90 tablet 2   famotidine  (PEPCID ) 40 MG tablet Take 40 mg by mouth 2 (two) times daily.     No current facility-administered medications on file prior to visit.        ROS:  All others reviewed and negative.  Objective        PE:  BP 134/80 (BP Location: Left Arm, Patient Position: Sitting, Cuff Size: Normal)   Pulse 78   Temp 98.3 F (36.8 C) (Oral)   Ht 5' 4 (1.626 m)   Wt 128 lb (58.1 kg)   SpO2 98%   BMI 21.97 kg/m                 Constitutional: Pt appears in NAD               HENT: Head: NCAT.                Right Ear: External ear normal.                 Left Ear: External ear normal.  Eyes: . Pupils are equal, round, and reactive to light. Conjunctivae and EOM are normal               Nose: without d/c or deformity               Right tongue with bite ulceration with swelling, tender, erythema  - no drainage; pharynx with marked erythema as well - no swelling or abscess noted               Neck: Neck supple. Gross normal ROM               Cardiovascular: Normal rate and regular rhythm.                 Pulmonary/Chest: Effort normal and breath sounds without rales               Neurological: Pt is alert. At baseline orientation, motor grossly intact               Skin: Skin is warm. No rashes, no other new lesions, LE edema - none               Psychiatric: Pt behavior is normal without agitation   Micro: none  Cardiac tracings I have personally interpreted today:  none  Pertinent Radiological findings (summarize): none   Lab Results  Component Value Date   WBC 4.3 08/25/2022   HGB 13.7 08/25/2022   HCT 40.4 08/25/2022   PLT 229.0 08/25/2022   GLUCOSE 99 08/25/2022   CHOL 212 (H) 08/25/2022   TRIG 65.0 08/25/2022   HDL 102.90 08/25/2022   LDLDIRECT 76.4 04/07/2011   LDLCALC 96 08/25/2022   ALT 10 08/25/2022    AST 18 08/25/2022   NA 138 08/25/2022   K 4.2 08/25/2022   CL 99 08/25/2022   CREATININE 0.65 08/25/2022   BUN 15 08/25/2022   CO2 31 08/25/2022   TSH 1.92 08/15/2021   HGBA1C 5.5 08/25/2022   Assessment/Plan:  VAYLA WILHELMI is a 73 y.o. Other or two or more races [6] female with  has a past medical history of Cellulitis of leg, right (08/27/2016), Hyperlipidemia, Osteopenia, Pneumonia, and Vitamin D  deficiency.  Vitamin D  deficiency Last vitamin D  Lab Results  Component Value Date   VD25OH 35.01 08/25/2022   Low, to start oral replacement   Ulceration, tongue traumatic Now with infection, for augmentin  875 bid x 10 d  Sore throat Highly likely it seems to be related to tongue infection - for augmentin  as above  Prediabetes Lab Results  Component Value Date   HGBA1C 5.5 08/25/2022   Stable, pt to continue current medical treatment  - diet, wt control  Followup: Return if symptoms worsen or fail to improve.  Lynwood Rush, MD 10/03/2023 6:33 PM [facility]

## 2023-10-02 NOTE — Patient Instructions (Signed)
 Please take all new medication as prescribed - the antibiotic  Please continue all other medications as before, and refills have been done if requested.  Please have the pharmacy call with any other refills you may need.  Please keep your appointments with your specialists as you may have planned

## 2023-10-03 ENCOUNTER — Encounter: Payer: Self-pay | Admitting: Internal Medicine

## 2023-10-03 DIAGNOSIS — J029 Acute pharyngitis, unspecified: Secondary | ICD-10-CM | POA: Insufficient documentation

## 2023-10-03 DIAGNOSIS — J312 Chronic pharyngitis: Secondary | ICD-10-CM | POA: Insufficient documentation

## 2023-10-03 DIAGNOSIS — K140 Glossitis: Secondary | ICD-10-CM | POA: Insufficient documentation

## 2023-10-03 NOTE — Assessment & Plan Note (Signed)
 Last vitamin D  Lab Results  Component Value Date   VD25OH 35.01 08/25/2022   Low, to start oral replacement

## 2023-10-03 NOTE — Assessment & Plan Note (Signed)
Lab Results  Component Value Date   HGBA1C 5.5 08/25/2022   Stable, pt to continue current medical treatment  - diet, wt control

## 2023-10-03 NOTE — Assessment & Plan Note (Signed)
 Now with infection, for augmentin  875 bid x 10 d

## 2023-10-03 NOTE — Assessment & Plan Note (Signed)
 Highly likely it seems to be related to tongue infection - for augmentin  as above

## 2023-10-23 ENCOUNTER — Encounter: Payer: Self-pay | Admitting: Internal Medicine

## 2023-11-10 ENCOUNTER — Encounter: Payer: Self-pay | Admitting: Internal Medicine

## 2023-11-10 DIAGNOSIS — M810 Age-related osteoporosis without current pathological fracture: Secondary | ICD-10-CM | POA: Diagnosis not present

## 2023-11-10 LAB — HM DEXA SCAN

## 2023-11-10 NOTE — Progress Notes (Unsigned)
 Subjective:    Patient ID: Linda [name], female    DOB: 1950-04-22, 73 y.o.   MRN: [id number]      HPI Maziah is here for a Physical exam and her chronic medical problems.    Chronic sore thorat -she continues to have a chronically sore throat.  She has occasional GERD but it is infrequent.  She does have some postnasal drainage.  She did see [facility]NT in 2024 and they did do a scope.  They put her on famotidine  which she took but stopped because it did not help.  She continues to have a sore throat.  She has not tried taking an antihistamine.        Medications and allergies reviewed with patient and updated if appropriate.  Current Outpatient Medications on File Prior to Visit  Medication Sig Dispense Refill   alendronate  (FOSAMAX ) 70 MG tablet Take 70 mg by mouth once a week.     Ascorbic Acid (VITAMIN C) 1000 MG tablet Take 1,000 mg by mouth daily.     Calcium Carb-Cholecalciferol (CALCIUM 1000 + D PO) Take by mouth daily.     cholecalciferol (VITAMIN D ) 1000 units tablet Take 1.5 tablets (1,500 Units total) by mouth daily. 90 tablet 2   famotidine  (PEPCID ) 40 MG tablet Take 40 mg by mouth 2 (two) times daily.     No current facility-administered medications on file prior to visit.    Review of Systems  Constitutional:  Negative for fever.  HENT:  Positive for postnasal drip, sore throat (chronic) and trouble swallowing (a little with sore throat - uncomfortable). Negative for congestion.   Eyes:  Negative for visual disturbance.  Respiratory:  Positive for cough (from sore throat). Negative for shortness of breath and wheezing.   Cardiovascular:  Negative for chest pain, palpitations and leg swelling.  Gastrointestinal:  Negative for abdominal pain, blood in stool, constipation and diarrhea.       Occ gerd  Genitourinary:  Negative for dysuria.  Musculoskeletal:  Positive for arthralgias (right wrist pain since she broke it several months ago). Negative for  back pain.  Skin:  Negative for rash.  Neurological:  Positive for dizziness (occ). Negative for light-headedness and headaches.  Psychiatric/Behavioral:  Positive for sleep disturbance (diff falling asleep and getting back to sleep). Negative for dysphoric mood. The patient is nervous/anxious (a little - controlled with exercise).        Objective:   Vitals:   11/11/23 0759  BP: 120/78  Pulse: 70  Temp: 98 F (36.7 C)  SpO2: 97%   Filed Weights   11/11/23 0759  Weight: 124 lb (56.2 kg)   Body mass index is 21.28 kg/m.  BP Readings from Last 3 Encounters:  11/11/23 120/78  10/02/23 134/80  08/21/23 120/72    Wt Readings from Last 3 Encounters:  11/11/23 124 lb (56.2 kg)  10/02/23 128 lb (58.1 kg)  08/25/23 124 lb (56.2 kg)       Physical Exam Constitutional: She appears well-developed and well-nourished. No distress.  HENT:  Head: Normocephalic and atraumatic.  Right Ear: External ear normal. Normal ear canal and TM Left Ear: External ear normal.  Normal ear canal and TM Mouth/Throat: Oropharynx is clear and moist.  Eyes: Conjunctivae normal.  Neck: Neck supple. No tracheal deviation present. No thyromegaly present.  No carotid bruit  Cardiovascular: Normal rate, regular rhythm and normal heart sounds.   No murmur heard.  No edema. Pulmonary/Chest: Effort normal and  breath sounds normal. No respiratory distress. She has no wheezes. She has no rales.  Breast: deferred   Abdominal: Soft. She exhibits no distension. There is no tenderness.  Lymphadenopathy: She has no cervical adenopathy.  Skin: Skin is warm and dry. She is not diaphoretic.  Psychiatric: She has a normal mood and affect. Her behavior is normal.     Lab Results  Component Value Date   WBC 4.3 08/25/2022   HGB 13.7 08/25/2022   HCT 40.4 08/25/2022   PLT 229.0 08/25/2022   GLUCOSE 99 08/25/2022   CHOL 212 (H) 08/25/2022   TRIG 65.0 08/25/2022   HDL 102.90 08/25/2022   LDLDIRECT 76.4  04/07/2011   LDLCALC 96 08/25/2022   ALT 10 08/25/2022   AST 18 08/25/2022   NA 138 08/25/2022   K 4.2 08/25/2022   CL 99 08/25/2022   CREATININE 0.65 08/25/2022   BUN 15 08/25/2022   CO2 31 08/25/2022   TSH 1.92 08/15/2021   HGBA1C 5.5 08/25/2022         Assessment & Plan:   Physical exam: Screening blood work  ordered Exercise  regular - gym Weight  normal Substance abuse  none   Reviewed recommended immunizations.   Health Maintenance  Topic Date Due   Zoster Vaccines- Shingrix (1 of 2) 08/09/2000   MAMMOGRAM  08/19/2020   COVID-19 Vaccine (7 - 2024-25 season) 06/01/2023   DEXA SCAN  08/09/2023   INFLUENZA VACCINE  10/23/2023   Medicare Annual Wellness (AWV)  08/24/2024   Fecal DNA (Cologuard)  09/01/2025   DTaP/Tdap/Td (3 - Td or Tdap) 10/02/2025   Pneumococcal Vaccine: 50+ Years  Completed   Hepatitis C Screening  Completed   HPV VACCINES  Aged Out   Meningococcal B Vaccine  Aged Out   Pneumococcal Vaccine  Discontinued   Colonoscopy  Discontinued          See Problem List for Assessment and Plan of chronic medical problems.

## 2023-11-10 NOTE — Patient Instructions (Addendum)
 Blood work was ordered.      Medications changes include :   start claritin or allegra - take in morning daily for at least 2 weeks.      A referral was ordered [facility] ENT and someone will call you to schedule an appointment.     Return in about 1 year (around 11/10/2024) for Physical Exam.    Health Maintenance, Female Adopting a healthy lifestyle and getting preventive care are important in promoting health and wellness. Ask your health care provider about: The right schedule for you to have regular tests and exams. Things you can do on your own to prevent diseases and keep yourself healthy. What should I know about diet, weight, and exercise? Eat a healthy diet  Eat a diet that includes plenty of vegetables, fruits, low-fat dairy products, and lean protein. Do not eat a lot of foods that are high in solid fats, added sugars, or sodium. Maintain a healthy weight Body mass index (BMI) is used to identify weight problems. It estimates body fat based on height and weight. Your health care provider can help determine your BMI and help you achieve or maintain a healthy weight. Get regular exercise Get regular exercise. This is one of the most important things you can do for your health. Most adults should: Exercise for at least 150 minutes each week. The exercise should increase your heart rate and make you sweat (moderate-intensity exercise). Do strengthening exercises at least twice a week. This is in addition to the moderate-intensity exercise. Spend less time sitting. Even light physical activity can be beneficial. Watch cholesterol and blood lipids Have your blood tested for lipids and cholesterol at 73 years of age, then have this test every 5 years. Have your cholesterol levels checked more often if: Your lipid or cholesterol levels are high. You are older than 73 years of age. You are at high risk for heart disease. What should I know about cancer  screening? Depending on your health history and family history, you may need to have cancer screening at various ages. This may include screening for: Breast cancer. Cervical cancer. Colorectal cancer. Skin cancer. Lung cancer. What should I know about heart disease, diabetes, and high blood pressure? Blood pressure and heart disease High blood pressure causes heart disease and increases the risk of stroke. This is more likely to develop in people who have high blood pressure readings or are overweight. Have your blood pressure checked: Every 3-5 years if you are 33-29 years of age. Every year if you are 88 years old or older. Diabetes Have regular diabetes screenings. This checks your fasting blood sugar level. Have the screening done: Once every three years after age 59 if you are at a normal weight and have a low risk for diabetes. More often and at a younger age if you are overweight or have a high risk for diabetes. What should I know about preventing infection? Hepatitis B If you have a higher risk for hepatitis B, you should be screened for this virus. Talk with your health care provider to find out if you are at risk for hepatitis B infection. Hepatitis C Testing is recommended for: Everyone born from [birth year] through 1965. Anyone with known risk factors for hepatitis C. Sexually transmitted infections (STIs) Get screened for STIs, including gonorrhea and chlamydia, if: You are sexually active and are younger than 73 years of age. You are older than 73 years of age and your health care  provider tells you that you are at risk for this type of infection. Your sexual activity has changed since you were last screened, and you are at increased risk for chlamydia or gonorrhea. Ask your health care provider if you are at risk. Ask your health care provider about whether you are at high risk for HIV. Your health care provider may recommend a prescription medicine to help prevent HIV  infection. If you choose to take medicine to prevent HIV, you should first get tested for HIV. You should then be tested every 3 months for as long as you are taking the medicine. Pregnancy If you are about to stop having your period (premenopausal) and you may become pregnant, seek counseling before you get pregnant. Take 400 to 800 micrograms (mcg) of folic acid  every day if you become pregnant. Ask for birth control (contraception) if you want to prevent pregnancy. Osteoporosis and menopause Osteoporosis is a disease in which the bones lose minerals and strength with aging. This can result in bone fractures. If you are 37 years old or older, or if you are at risk for osteoporosis and fractures, ask your health care provider if you should: Be screened for bone loss. Take a calcium or vitamin D  supplement to lower your risk of fractures. Be given hormone replacement therapy (HRT) to treat symptoms of menopause. Follow these instructions at home: Alcohol use Do not drink alcohol if: Your health care provider tells you not to drink. You are pregnant, may be pregnant, or are planning to become pregnant. If you drink alcohol: Limit how much you have to: 0-1 drink a day. Know how much alcohol is in your drink. In the [place] one drink equals one 12 oz bottle of beer (355 mL), one 5 oz glass of wine (148 mL), or one 1 oz glass of hard liquor (44 mL). Lifestyle Do not use any products that contain nicotine or tobacco. These products include cigarettes, chewing tobacco, and vaping devices, such as e-cigarettes. If you need help quitting, ask your health care provider. Do not use street drugs. Do not share needles. Ask your health care provider for help if you need support or information about quitting drugs. General instructions Schedule regular health, dental, and eye exams. Stay current with your vaccines. Tell your health care provider if: You often feel depressed. You have ever been abused  or do not feel safe at home. Summary Adopting a healthy lifestyle and getting preventive care are important in promoting health and wellness. Follow your health care provider's instructions about healthy diet, exercising, and getting tested or screened for diseases. Follow your health care provider's instructions on monitoring your cholesterol and blood pressure. This information is not intended to replace advice given to you by your health care provider. Make sure you discuss any questions you have with your health care provider. Document Revised: 07/30/2020 Document Reviewed: 07/30/2020 Elsevier Patient Education  2024 [employer].

## 2023-11-11 ENCOUNTER — Ambulatory Visit (INDEPENDENT_AMBULATORY_CARE_PROVIDER_SITE_OTHER): Admitting: Internal Medicine

## 2023-11-11 VITALS — BP 120/78 | HR 70 | Temp 98.0°F | Ht 64.0 in | Wt 124.0 lb

## 2023-11-11 DIAGNOSIS — R7303 Prediabetes: Secondary | ICD-10-CM

## 2023-11-11 DIAGNOSIS — J312 Chronic pharyngitis: Secondary | ICD-10-CM

## 2023-11-11 DIAGNOSIS — E559 Vitamin D deficiency, unspecified: Secondary | ICD-10-CM | POA: Diagnosis not present

## 2023-11-11 DIAGNOSIS — Z0000 Encounter for general adult medical examination without abnormal findings: Secondary | ICD-10-CM | POA: Diagnosis not present

## 2023-11-11 DIAGNOSIS — M810 Age-related osteoporosis without current pathological fracture: Secondary | ICD-10-CM | POA: Diagnosis not present

## 2023-11-11 LAB — COMPREHENSIVE METABOLIC PANEL WITH GFR
ALT: 12 U/L (ref 0–35)
AST: 17 U/L (ref 0–37)
Albumin: 4.6 g/dL (ref 3.5–5.2)
Alkaline Phosphatase: 48 U/L (ref 39–117)
BUN: 12 mg/dL (ref 6–23)
CO2: 30 meq/L (ref 19–32)
Calcium: 9.7 mg/dL (ref 8.4–10.5)
Chloride: 101 meq/L (ref 96–112)
Creatinine, Ser: 0.59 mg/dL (ref 0.40–1.20)
GFR: 89.51 mL/min (ref >60.00)
Glucose, Bld: 100 mg/dL — ABNORMAL HIGH (ref 70–99)
Potassium: 4.6 meq/L (ref 3.5–5.1)
Sodium: 140 meq/L (ref 135–145)
Total Bilirubin: 0.6 mg/dL (ref 0.2–1.2)
Total Protein: 7.3 g/dL (ref 6.0–8.3)

## 2023-11-11 LAB — CBC WITH DIFFERENTIAL/PLATELET
Basophils Absolute: 0 K/uL (ref 0.0–0.1)
Basophils Relative: 0.6 % (ref 0.0–3.0)
Eosinophils Absolute: 0.1 K/uL (ref 0.0–0.7)
Eosinophils Relative: 2 % (ref 0.0–5.0)
HCT: 41.7 % (ref 36.0–46.0)
Hemoglobin: 14.1 g/dL (ref 12.0–15.0)
Lymphocytes Relative: 37.8 % (ref 12.0–46.0)
Lymphs Abs: 1.4 K/uL (ref 0.7–4.0)
MCHC: 33.9 g/dL (ref 30.0–36.0)
MCV: 88.9 fl (ref 78.0–100.0)
Monocytes Absolute: 0.4 K/uL (ref 0.1–1.0)
Monocytes Relative: 9.8 % (ref 3.0–12.0)
Neutro Abs: 1.8 K/uL (ref 1.4–7.7)
Neutrophils Relative %: 49.8 % (ref 43.0–77.0)
Platelets: 229 K/uL (ref 150.0–400.0)
RBC: 4.69 Mil/uL (ref 3.87–5.11)
RDW: 13.6 % (ref 11.5–15.5)
WBC: 3.6 K/uL — ABNORMAL LOW (ref 4.0–10.5)

## 2023-11-11 LAB — LIPID PANEL
Cholesterol: 215 mg/dL — ABNORMAL HIGH (ref 0–200)
HDL: 104.8 mg/dL (ref >39.00)
LDL Cholesterol: 96 mg/dL (ref 0–99)
NonHDL: 110.09
Total CHOL/HDL Ratio: 2
Triglycerides: 70 mg/dL (ref 0.0–149.0)
VLDL: 14 mg/dL (ref 0.0–40.0)

## 2023-11-11 LAB — TSH: TSH: 1.38 u[IU]/mL (ref 0.35–5.50)

## 2023-11-11 LAB — HEMOGLOBIN A1C: Hgb A1c MFr Bld: 5.8 % (ref 4.6–6.5)

## 2023-11-11 LAB — VITAMIN D 25 HYDROXY (VIT D DEFICIENCY, FRACTURES): VITD: 44.66 ng/mL (ref 30.00–100.00)

## 2023-11-11 NOTE — Assessment & Plan Note (Signed)
 Chronic Saw ENT at [facility] in 2024-looks like the scope was negative Famotidine  did not help Does have some postnasal drip and has never tried antihistamine Start Claritin or Allegra daily for at least 2 weeks-I daily continue until she sees ENT again to reevaluate Referral to [facility] health ENT

## 2023-11-11 NOTE — Assessment & Plan Note (Signed)
 Chronic Continue vitamin D daily Check vitamin D level

## 2023-11-11 NOTE — Assessment & Plan Note (Signed)
 Chronic Lab Results  Component Value Date   HGBA1C 5.5 08/25/2022  Check A1c Continue regular exercise Continue healthy diet

## 2023-11-11 NOTE — Assessment & Plan Note (Addendum)
 Chronic DEXA up-to-date-just had it done Never started Fosamax  70 mg weekly prescribed by GYN-would like to avoid medication Continue calcium and vitamin D  - inc calcium intake Check vitamin D  level Continue regular exercise

## 2023-11-12 ENCOUNTER — Ambulatory Visit: Payer: Self-pay | Admitting: Internal Medicine

## 2023-11-12 ENCOUNTER — Encounter: Payer: Self-pay | Admitting: Internal Medicine

## 2023-11-14 ENCOUNTER — Ambulatory Visit: Payer: Self-pay | Admitting: Internal Medicine

## 2023-11-16 DIAGNOSIS — Z1231 Encounter for screening mammogram for malignant neoplasm of breast: Secondary | ICD-10-CM | POA: Diagnosis not present

## 2023-11-16 LAB — HM MAMMOGRAPHY

## 2023-11-19 ENCOUNTER — Encounter: Payer: Self-pay | Admitting: Internal Medicine

## 2023-12-22 ENCOUNTER — Encounter: Payer: Self-pay | Admitting: Internal Medicine

## 2024-01-04 ENCOUNTER — Encounter (INDEPENDENT_AMBULATORY_CARE_PROVIDER_SITE_OTHER): Payer: Self-pay | Admitting: Otolaryngology

## 2024-01-04 ENCOUNTER — Ambulatory Visit (INDEPENDENT_AMBULATORY_CARE_PROVIDER_SITE_OTHER): Admitting: Otolaryngology

## 2024-01-04 VITALS — BP 134/78 | HR 82 | Temp 98.6°F

## 2024-01-04 DIAGNOSIS — K219 Gastro-esophageal reflux disease without esophagitis: Secondary | ICD-10-CM

## 2024-01-04 DIAGNOSIS — Z9109 Other allergy status, other than to drugs and biological substances: Secondary | ICD-10-CM

## 2024-01-04 DIAGNOSIS — R0981 Nasal congestion: Secondary | ICD-10-CM

## 2024-01-04 DIAGNOSIS — J312 Chronic pharyngitis: Secondary | ICD-10-CM

## 2024-01-04 DIAGNOSIS — R0982 Postnasal drip: Secondary | ICD-10-CM | POA: Diagnosis not present

## 2024-01-04 DIAGNOSIS — J3089 Other allergic rhinitis: Secondary | ICD-10-CM

## 2024-01-04 MED ORDER — LEVOCETIRIZINE DIHYDROCHLORIDE 5 MG PO TABS: 5.0000 mg | ORAL_TABLET | Freq: Every evening | ORAL | 3 refills | Status: AC

## 2024-01-04 MED ORDER — FLUTICASONE PROPIONATE 50 MCG/ACT NA SUSP
2.0000 | Freq: Every day | NASAL | 6 refills | Status: AC
Start: 2024-01-04 — End: ?

## 2024-01-04 NOTE — Progress Notes (Signed)
 ENT CONSULT:  Reason for Consult: chronic sore throat    HPI: Discussed the use of AI scribe software for clinical note transcription with the patient, who gave verbal consent to proceed.  History of Present Illness Linda [name] is a 73 year old female who presents with a chronic sore throat.  She has been experiencing a chronic sore throat for approximately one year. A flexible laryngoscopy was performed by an ENT specialist in [facility] approximately a year ago, and she was prescribed medication for heartburn, which she took for one to two months without improvement. She does not recall specific findings from the ENT evaluation.  The sore throat primarily occurs when eating, necessitating frequent swallowing. She has not significantly modified her diet and eats light meals. She has not been tested for allergies and is not currently taking any allergy medications. She was previously prescribed Flonase  and Allegra but is not using them due to concerns about medication side effects.  Additionally, she experiences very dry eyes, requiring frequent use of eye drops, which she finds minimally effective. At night, her eyes sometimes 'almost close up,' and she struggles to open them. She has an eye doctor for this issue.    Records Reviewed:  ENT consult at [facility] 08/27/22 Ms. Linda [name] is a 73 y.o. female is seen for evaluation of odynophagia. She reports bilateral lower throat pain. This is worse she eats. She reports pain occasionally when not eating. Reports the pain as moderate. There is some bilateral ear discomfort. No neck masses, weight loss, B symptoms, voice changes. No impactions. This is worse with a hard cookie, or hard solid food. Does not eat meat. No swallowing studies. No GERD medications. +GERD history.  Never smoker. EtOH- 1-2 per night.  There is also R cerumen impaction. Has tried mineral oil. No prior ear surgeries.   My impression is that [place] has  1. Allergic  rhinitis due to pollen, unspecified seasonality  2. Odynophagia  3. Globus sensation    Past Medical History:  Diagnosis Date   Cellulitis of leg, right 08/27/2016   Hyperlipidemia    Osteopenia    Pneumonia    Vitamin D  deficiency     Past Surgical History:  Procedure Laterality Date   COLONOSCOPY  03/2011   Dr Brodie;diminuitive polyp   G 2 P 2     TONSILLECTOMY      Family History  Problem Relation Age of Onset   Melanoma Father    Hyperlipidemia Mother    Heart disease Mother        no MI   Diabetes Paternal Grandmother        vision loss   Diabetes Maternal Uncle    Colon cancer Neg Hx    Stomach cancer Neg Hx    Esophageal cancer Neg Hx    Rectal cancer Neg Hx    Stroke Neg Hx     Social History:  reports that she has never smoked. She has never used smokeless tobacco. She reports current alcohol use of about 1.0 standard drink of alcohol per week. She reports that she does not use drugs.  Allergies:  Allergies  Allergen Reactions   Fosamax  [Alendronate ] Other (See Comments)    GERD    Medications: I have reviewed the patient's current medications.  The PMH, PSH, Medications, Allergies, and SH were reviewed and updated.  ROS: Constitutional: Negative for fever, weight loss and weight gain. Cardiovascular: Negative for chest pain and dyspnea on exertion. Respiratory: Is  not experiencing shortness of breath at rest. Gastrointestinal: Negative for nausea and vomiting. Neurological: Negative for headaches. Psychiatric: The patient is not nervous/anxious  Blood pressure 134/78, pulse 82, temperature 98.6 F (37 C), SpO2 95%.  PHYSICAL EXAM:  Exam: General: Well-developed, well-nourished Respiratory Respiratory effort: Equal inspiration and expiration without stridor Cardiovascular Peripheral Vascular: Warm extremities with equal color/perfusion Eyes: No nystagmus with equal extraocular motion bilaterally Neuro/Psych/Balance: Patient oriented to  person, place, and time; Appropriate mood and affect; Gait is intact with no imbalance; Cranial nerves I-XII are intact Head and Face Inspection: Normocephalic and atraumatic without mass or lesion Palpation: Facial skeleton intact without bony stepoffs Salivary Glands: No mass or tenderness Facial Strength: Facial motility symmetric and full bilaterally ENT Pinna: External ear intact and fully developed External canal: Canal is patent with intact skin Tympanic Membrane: Clear and mobile External Nose: No scar or anatomic deformity Internal Nose: Septum intact and midline. No edema, polyp, or rhinorrhea Lips, Teeth, and gums: Mucosa and teeth intact and viable TMJ: No pain to palpation with full mobility Oral cavity/oropharynx: No erythema or exudate, no lesions present Neck Neck and Trachea: Midline trachea without mass or lesion Thyroid : No mass or nodularity Lymphatics: No lymphadenopathy  Patient deferred scope exam today  Studies Reviewed: CT chest 09/17/20 IMPRESSION: Near complete resolution of previously seen nodular infiltrate along the minor fissure in the right upper lobe laterally. Some residual nodularity is noted along the major fissure as well as in the upper lobe centrally. This likely represents some waxing and waning infiltrate. Short-term follow-up in 3 months is recommended by means of noncontrast CT.   Stable nodules in the lungs bilaterally. These can also be followed in 3 months.  Assessment/Plan: Encounter Diagnoses  Name Primary?   Chronic sore throat Yes   Environmental allergies    Chronic GERD    Post-nasal drip    Environmental and seasonal allergies    Chronic nasal congestion     Assessment and Plan Assessment & Plan Chronic sore throat  Sx after eating with suspected gastroesophageal reflux disease and allergic rhinitis Unfortunately she elected to defer scope exam today and we did not perform flexible laryngoscopy. She reports normal  scope exam when she was seen by an ENT at [facility].  Chronic sore throat likely due to GERD, with possible contribution from allergic rhinitis. Previous medications ineffective. Prefers non-pharmacological options. - we discussed that oropharyngeal exam was normal but her sx could be due to GERD irritation especially since they occur after meals  GERD LPR She prefers to avoid medications -  Reflux Gourmet after meals - diet and lifestyle changes to minimize GERD - Refer to [employer] blog for dietary and lifestyle modifications/reflux cook book  Chronic nasal congestion and post-nasal drainage Evidence of post-nasal drainage during flexible scope exam today, could be contributing to her sx - trial of Xyzal 5 mg daily and Flonase  2 puffs b/l nares BID - consider nasal saline rinses     Thank you for allowing me to participate in the care of this patient. Please do not hesitate to contact me with any questions or concerns.   Elena Larry, MD Otolaryngology [facility] Phone: [phone] Fax: [phone]    01/04/2024, 1:25 PM

## 2024-01-04 NOTE — Patient Instructions (Signed)

## 2024-01-25 ENCOUNTER — Encounter: Payer: Self-pay | Admitting: Radiology

## 2024-08-29 ENCOUNTER — Ambulatory Visit
